# Patient Record
Sex: Female | Born: 1955 | Race: Black or African American | Hispanic: No | Marital: Married | Smoking: Never smoker
Health system: Southern US, Community
[De-identification: ages and names within clinical notes are randomized; demographics above are authoritative.]

## PROBLEM LIST (undated history)

## (undated) DIAGNOSIS — J9611 Chronic respiratory failure with hypoxia: Secondary | ICD-10-CM

## (undated) DIAGNOSIS — R7303 Prediabetes: Secondary | ICD-10-CM

## (undated) DIAGNOSIS — J849 Interstitial pulmonary disease, unspecified: Secondary | ICD-10-CM

## (undated) DIAGNOSIS — J479 Bronchiectasis, uncomplicated: Secondary | ICD-10-CM

## (undated) DIAGNOSIS — C50919 Malignant neoplasm of unspecified site of unspecified female breast: Secondary | ICD-10-CM

## (undated) DIAGNOSIS — I10 Essential (primary) hypertension: Secondary | ICD-10-CM

## (undated) DIAGNOSIS — J8410 Pulmonary fibrosis, unspecified: Secondary | ICD-10-CM

## (undated) DIAGNOSIS — R0600 Dyspnea, unspecified: Secondary | ICD-10-CM

## (undated) DIAGNOSIS — I509 Heart failure, unspecified: Secondary | ICD-10-CM

## (undated) HISTORY — PX: ABDOMINAL HYSTERECTOMY: SHX81

## (undated) HISTORY — DX: Malignant neoplasm of unspecified site of unspecified female breast: C50.919

---

## 2007-07-14 ENCOUNTER — Ambulatory Visit: Payer: Self-pay | Admitting: Family Medicine

## 2011-08-11 ENCOUNTER — Inpatient Hospital Stay: Payer: Self-pay | Admitting: Internal Medicine

## 2011-08-11 LAB — CK TOTAL AND CKMB (NOT AT ARMC)
CK, Total: 114 U/L (ref 21–215)
CK-MB: 2.5 ng/mL (ref 0.5–3.6)
CK-MB: 1.4 ng/mL (ref 0.5–3.6)
CK-MB: 1 ng/mL (ref 0.5–3.6)

## 2011-08-11 LAB — BASIC METABOLIC PANEL
BUN: 13 mg/dL (ref 7–18)
Calcium, Total: 8.6 mg/dL (ref 8.5–10.1)
Chloride: 107 mmol/L (ref 98–107)
Creatinine: 0.74 mg/dL (ref 0.60–1.30)
EGFR (African American): >60
Potassium: 3.4 mmol/L — ABNORMAL LOW (ref 3.5–5.1)
Sodium: 142 mmol/L (ref 136–145)

## 2011-08-11 LAB — LIPID PANEL
HDL Cholesterol: 31 mg/dL — ABNORMAL LOW (ref 40–60)
Ldl Cholesterol, Calc: 114 mg/dL — ABNORMAL HIGH (ref 0–100)
Triglycerides: 112 mg/dL (ref 0–200)
VLDL Cholesterol, Calc: 22 mg/dL (ref 5–40)

## 2011-08-11 LAB — TROPONIN I
Troponin-I: <0.02 ng/mL
Troponin-I: <0.02 ng/mL

## 2011-08-11 LAB — HEPATIC FUNCTION PANEL A (ARMC)
Albumin: 3.4 g/dL (ref 3.4–5.0)
Bilirubin, Direct: <0.1 mg/dL (ref 0.00–0.20)
SGOT(AST): 28 U/L (ref 15–37)
SGPT (ALT): 22 U/L

## 2011-08-11 LAB — CBC
HCT: 37.1 % (ref 35.0–47.0)
HGB: 12.2 g/dL (ref 12.0–16.0)
MCH: 26.5 pg (ref 26.0–34.0)
MCHC: 33 g/dL (ref 32.0–36.0)
Platelet: 278 10*3/uL (ref 150–440)
RBC: 4.61 10*6/uL (ref 3.80–5.20)

## 2011-08-11 LAB — TSH: Thyroid Stimulating Horm: 0.581 u[IU]/mL

## 2011-08-12 LAB — LIPID PANEL
Cholesterol: 165 mg/dL (ref 0–200)
Triglycerides: 134 mg/dL (ref 0–200)
VLDL Cholesterol, Calc: 27 mg/dL (ref 5–40)

## 2011-08-12 LAB — BASIC METABOLIC PANEL
Anion Gap: 11 (ref 7–16)
Calcium, Total: 8.6 mg/dL (ref 8.5–10.1)
Co2: 27 mmol/L (ref 21–32)
EGFR (African American): >60
Osmolality: 289 (ref 275–301)
Potassium: 3.9 mmol/L (ref 3.5–5.1)
Sodium: 145 mmol/L (ref 136–145)

## 2013-05-16 ENCOUNTER — Ambulatory Visit: Payer: Self-pay | Admitting: Family Medicine

## 2014-12-02 NOTE — Discharge Summary (Signed)
PATIENT NAME:  Erika Schmidt, Erika Schmidt MR#:  [id number] DATE OF BIRTH:  September 17, 1955  DATE OF ADMISSION:  08/11/2011 DATE OF DISCHARGE:  08/12/2011  ADMITTING DIAGNOSIS: Chest pain.   DISCHARGE DIAGNOSES:  1. Chest pain of unclear etiology at this time. Negative cardiac enzymes for injury, status post Myoview which was negative for inducible ischemia.  2. Malignant hypertension.  3. Congestive heart failure, left heart, acute, diastolic.  4. Lower extremity swelling.  5. Obesity.  6. Hyperglycemia with hemoglobin A1c 6.5, questionable diabetes.  7. Hyperglycemia with LDL 114.   DISCHARGE CONDITION: Fair.   DISCHARGE MEDICATIONS: The patient is to resume her outpatient medications which are: Visine 0.05% ophthalmic solution, two drops each affected eye as needed for red eye.   ADDITIONAL MEDICATIONS:  1. Aspirin 81 mg p.o. daily.  2. Maalox 15 mg every six hours as needed.  3. Nitroglycerin 0.4 mg sublingually every five minutes as needed.  4. Colace 100 milligrams p.o. twice daily.  5. Milk of magnesia 30 mL twice daily as needed.  6. Lisinopril 5 mg p.o. daily.  7. Lasix 20 mg p.o. daily.  8. K-Dur 10 milliequivalents p.o. daily.  9. Toprol-XL 50 mg p.o. daily.  HOME OXYGEN: None.   DIET: 2 grams salt, 1,800 ADA, low fat, low cholesterol.   PHYSICAL ACTIVITY LIMITATIONS: As tolerated.   FOLLOW-UP APPOINTMENT: Dr. Serafina Royals in two days after discharge as well as [facility] in two days after discharge.   CONSULTANTS: Dr. Nehemiah Massed as well as Dr. Saralyn Pilar.   RADIOLOGIC STUDIES: Chest x-ray PA and lateral 08/11/2011: Prominent pulmonary interstitium may be related to technique. Differential would include interstitial pulmonary edema and atypical infection. CT of chest with IV contrast to rule out pulmonary embolism on 08/11/2011 revealed no pulmonary embolus seen. Evaluation of the segmental pulmonary arteries is limited, nonspecific pulmonary ground-glass opacities.  Differential would include pulmonary edema, atypical infection and hypersensitive pneumonitis. There are trace bilateral pleural effusions Myoview stress test 08/12/2011 negative ETT. Normal left ventricular function, normal wall motion, normal sestamibi based scintigraphy without evidence of scar or ischemia. Ultrasound of bilateral lower extremities 08/10/2010: No evidence of deep vein thrombosis in bilateral lower extremities. Echocardiogram 08/14/2011: Left ventricular systolic function is normal. Ejection fraction 55%. Left atrium is mildly dilated. There is moderate to severe mild regurgitation. There is mild tricuspid regurgitation. Otherwise, no acute abnormalities.   HISTORY: The patient is a 59 year old African American female with no significant past medical history who presented to the hospital on 08/11/2011 with complaints of chest pains. Please refer to Dr. Serita Grit admission note on 08/11/2011. On arrival to the Emergency Room, the patient's vitals revealed that her temperature was 98.3, heart rate was 102, respiration rate 24, blood pressure 155/70, saturation was 95% on oxygen therapy.   PHYSICAL EXAMINATION: Unremarkable.   LABORATORY AND DIAGNOSTIC DATA: The patient's EKG in the Emergency Room showed normal sinus rhythm, possible left atrial enlargement, low voltage QRS, cannot rule out anterior infarct, age undetermined according to EKG criteria.  Lab data: BMP showed an elevated glucose to 109 and potassium level of 3.4. B-type natriuretic peptide was elevated at 417, otherwise unremarkable BMP. The patient's hemoglobin A1c was found to be elevated at 6.5. Liver enzymes were normal. The patient's cardiac enzymes, first set, as well as subsequent two more sets, were within normal limits. TSH was normal at 0.581. The patient's CBC was normal. D-dimer results were normal at 0.34. ABGs were done on 08/11/2011 and showed a pH of 7.48,  pCO2 37, pO2 118, saturation 98.9% on 28% FiO2 through nasal  cannula. Influenza test was negative. The patient's chest x-ray was concerning for possible congestive heart failure versus pneumonitis.  HOSPITAL COURSE: The patient was admitted to the hospital because of chest pain. She underwent an echocardiogram as well as Myoview stress test. Both of those studies were remarkable only for severe mitral regurgitation. The patient was consulted by Dr. Nehemiah Massed who followed the patient along. He felt that the patient should ambulate and follow for further significant symptoms requiring  further intervention. However, he recommended to discharge the patient to home if she is feeling well. She is to follow up with further risk factor treatment as outpatient. The patient was ambulated and she did not have any recurrent symptoms, so she will be discharged home to establish primary care physician at [facility] as well as follow-up with Dr. Nehemiah Massed in the next few days after discharge.   On the day of discharge, her temperature was 97.9, pulse 75, respirations 20, blood pressure 143/82, saturation was 97% on room air at rest. Etiology of chest pain remains unclear. However, it is possible that the patient did have chest pain because of elevated blood pressure or any other, including even gastroenterologic problems. It is recommended, however, to further evaluate the patient for recurrent chest pains if needed.   The patient was noted to have malignant hypertension. In fact, on arrival to the Emergency Room, the patient's blood pressure was elevated and it remained intermittently elevated while she was on the floor. Her blood pressure was as high as 160s. The patient was started on medication for blood pressure management and her blood pressure improved. It is recommended to follow the patient's blood pressure readings as outpatient and advance her medications as necessary. Because of concern about mild congestive heart failure due to lower extremity swelling as well as  nonspecific changes on her chest x-ray as well as CT of her chest, decision was made to place the patient also on low doses of Lasix apart from ACE inhibitor to improve the patient's cardiac afterload. She was recommended to follow up with her primary care physician, as well as cardiologist, for further recommendations and possibly even advancement of her medications if needed. As mentioned above, the patient's blood pressure readings were from 112 to 140s upon discharge.   The patient was felt to have congestive heart failure, left heart, acute diastolic, as well as lower extremity swelling related to congestive heart failure. As mentioned above, she is to continue Lasix as well as ACE inhibitors. She is to follow-up for further recommendations. The patient had nocturnal oximetry study while she was in the hospital because of concern of possible obstructive sleep apnea. However, she did not significantly desaturate during her stay in the hospital on overnight oximetry study. However, as it was unclear how much the patient actually slept, as the patient may benefit, it is recommended for the patient to undergo sleep study to better evaluate her for possible obstructive sleep apnea which was of concern upon discharge.  Because of the patient's obesity, multiple risk factors were evaluated. She was noted to be hyperglycemic. Her hemoglobin A1c was found to be also 6.5. She was counseled by dietitian about diabetic diet. She was also recommended to lose weight if possible to diminish her risks of diabetes.   The patient's lipid panel checked while she was in the hospital and LDL was found to be elevated at 114. The patient's triglycerides were  112 and HDL was 31. The patient was advised, as mentioned above, to follow strict diet and hopefully lose weight. This was a way to help herself with hyperlipidemia management. TSH was checked and was found to be within normal limits.  The patient is being discharged with  the above-mentioned medications and follow-up   TIME SPENT: 40 minutes.   ____________________________ Theodoro Grist, MD rv:ap D: 08/12/2011 19:07:24 ET T: 08/14/2011 11:39:08 ET JOB#: [id number]  cc: Theodoro Grist, MD, <Dictator> Corey Skains, MD [facility] [place] MD ELECTRONICALLY SIGNED 09/07/2011 7:15

## 2014-12-02 NOTE — H&P (Signed)
PATIENT NAME:  Erika Schmidt, Erika Schmidt MR#:  [id number] DATE OF BIRTH:  27-Aug-1955  DATE OF ADMISSION:  08/11/2011  PRIMARY CARE PHYSICIAN: [facility]  EMERGENCY ROOM PHYSICIAN: Dr. Payton Doughty   CHIEF COMPLAINT: Chest pain.   HISTORY OF PRESENT ILLNESS: Patient is a 59 year old African American female who presents with chief complaint of chest pain. Symptoms began at 1:00 in the morning while patient was sitting in bed resting. Patient had drank some champagne earlier last evening with her brother and she attributed her symptoms to drinking the champagne initially. Patient reports that she was short of breath associated with this and she was dizzy, he had palpitations. Chest pain she describes as constant, nagging pain. She denies any radiation of the pain. Patient had upper respiratory tract infection one week ago and she had been taking some NyQuil.    ALLERGIES: Norfloxacin.    CURRENT MEDICATIONS: None.   SOCIAL HISTORY: Patient denies tobacco abuse, alcohol abuse or drug abuse. 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   FAMILY HISTORY: Patient's father in his 33s died, had a stroke. Negative for hypertension. Family history positive for heart disease.   REVIEW OF SYSTEMS: CONSTITUTIONAL: Patient denies any fevers, chills, night sweats. HEENT: Patient denies any hearing loss, dysphagia, visual problems, sore throat. CARDIOVASCULAR: Patient denies any orthopnea, PND, syncope. RESPIRATORY: Patient denies any cough, wheezing, or hemoptysis. GASTROINTESTINAL: Patient denies any nausea, vomiting, abdominal pain, hematemesis, hematochezia, melena. GENITOURINARY: Patient denies any hematuria, dysuria, or frequency. NEUROLOGIC: Patient denies any headache, focal weakness or seizures. SKIN: Patient denies any lesions, rash. ENDOCRINE: Patient denies polyphagia, polydipsia. MUSCULOSKELETAL: Patient denies any arthritis, joint effusion, swelling. HEMATOLOGICAL: Patient denies any easy bleeding or  bruises.   PHYSICAL EXAMINATION:  VITAL SIGNS: Temperature 98.3, heart rate 102, respiratory rate 24, blood pressure 155/70, oxygen sats 95%.   HEENT: Atraumatic, normocephalic. Pupils are equal, round, and reactive to light and accommodation. Extraocular movements are intact. Sclera anicteric. Mucous membranes moist.   NECK: Supple. No organomegaly.   CARDIOVASCULAR: S1, S2, regular rate, rhythm. No gallops. No thrills. No murmurs.   RESPIRATORY: Lungs are clear to auscultation. No rales, no rhonchi, no wheezes, and bronchial breath sounds.  GASTROINTESTINAL: Abdomen soft, nontender, nondistended. Normal bowel sounds. No hepatosplenomegaly.   GENITOURINARY: There is no hematuria or masses noted.   SKIN: No lesions. No rash.   ENDOCRINE: No masses. No thyromegaly.  LYMPH: No lymphadenopathy or nodes palpable.   NEUROLOGIC: Cranial nerves II through XII grossly intact. Motor strength is 5/5 bilateral upper and lower extremities. Sensation within normal limits. No focal neurological deficits noted on examination.   MUSCULOSKELETAL: No arthritis, joint effusion, swelling.   HEMATOLOGIC: No ecchymosis, no bleeding, no petechiae noted.   LABORATORY, DIAGNOSTIC AND RADIOLOGICAL DATA: BNP 417. Glucose 109, BUN 13, creatinine 0.74, sodium 142, potassium 3.4, chloride 107. CO2 24, calcium 8.6, estimated GFR greater than 60, total CK 150, CK-MB 2.5. WBC count 10,200, hemoglobin 12.2, hematocrit 37.1, platelet count 278. Troponin less than 0.02, d-dimer 0.34, total bilirubin 0.5, direct bilirubin 0.1, alkaline phosphatase 73, ALT 22, AST 28, total protein 7.4, albumin 3.4. BNP 417.   ASSESSMENT AND PLAN:  1. Patient is a 59 year old female presents with chief complaint of chest pain. Will admit to telemetry, start aspirin, check serial cardiac enzymes, troponin, echo. Cardiology consultation.  2. Hypertension. Start Lopressor.  3. Elevated BNP, mild to moderate elevation. Unclear significance.  On chest examination lungs without any crackles. Likely this represents new-onset congestive heart failure. 4. Hypokalemia.  Replace potassium. Recheck in the morning.  5. Hypoxia. Will check ABG. Check CT of the chest for evaluation of pulmonary embolus.   ____________________________ Tyrone Schimke, MD jsp:cms D: 08/11/2011 06:32:14 ET T: 08/12/2011 05:51:58 ET JOB#: [id number]  cc: Tyrone Schimke, MD, <Dictator> [place] Tyrone Schimke MD ELECTRONICALLY SIGNED 08/12/2011 21:45

## 2014-12-02 NOTE — Consult Note (Signed)
Present Illness 59 year old female with known hypertension, borderline hyperlipidemia with acute onset of shortness of breath, weakness, chest discomfort over the last several days.  The patient has had some hypoxia and wheezing on admission.  She does have an EKG showing normal sinus rhythm with nonspecific ST changes.  Troponin and CK-MB are within normal limits.  She does also have resolution of her chest pain without current evidence of myocardial infarction.  She has been ambulating relatively well at this stage, without any further chest discomfort  Family history No family members with early onset cardiovascular disease  Social history She currently denies alcohol or tobacco use   Physical Exam:   GEN WD    HEENT pink conjunctivae    NECK No masses    RESP crackles    CARD Regular rate and rhythm    ABD denies tenderness  no hernia  soft    LYMPH negative neck    EXTR positive edema    NEURO cranial nerves intact    PSYCH alert   Review of Systems:   Subjective/Chief Complaint I'm short of breath and had chest pain    Respiratory: Short of breath    Cardiovascular: Tightness    Review of Systems: All other systems were reviewed and found to be negative    Medications/Allergies Reviewed Medications/Allergies reviewed     denies med hx:    hysterectomy:        Admit Diagnosis:   CHEST PAIN [place]: 11-Aug-2011, Active, CHEST PAIN [place]      Admit Reason:   Unspecified chest pain: (786.50) 11-Aug-2011, Active, ICD9, Unspecified chest pain, Auto-generated by [facility] Based on Admission Order   Intermediate coronary syndrome: (411.1) 11-Aug-2011, Active, ICD9, Intermediate coronary syndrome, Auto-generated by [facility] Based on Admission Order   Acute myocardial infarction, unspecified site, episode of care unspecified: (410.90) 11-Aug-2011, Active, ICD9, Acute myocardial infarction, unspecified site, episode of care unspecified, Auto-generated by [facility] Based on Admission  Order  Routine Chem:  01-Jan-13 04:24    Glucose, Serum 109   BUN 13   Creatinine (comp) 0.74   Sodium, Serum 142   Potassium, Serum 3.4   Chloride, Serum 107   CO2, Serum 24   Calcium (Total), Serum 8.6   Anion Gap 11   Osmolality (calc) 284   eGFR (African American) >60   eGFR (Non-African American) >60  Cardiac:  01-Jan-13 04:24    CK, Total 150   CPK-MB, Serum 2.5  Routine Hem:  01-Jan-13 04:24    WBC (CBC) 10.2   RBC (CBC) 4.61   Hemoglobin (CBC) 12.2   Hematocrit (CBC) 37.1   Platelet Count (CBC) 278   MCV 80   MCH 26.5   MCHC 33.0   RDW 14.2  Cardiac:  01-Jan-13 04:24    Troponin I < 0.02  Routine Coag:  01-Jan-13 04:24    D-Dimer, Quantitative 0.34  Hepatic:  01-Jan-13 04:24    Bilirubin, Total 0.5   Bilirubin, Direct < 0.1   Alkaline Phosphatase 73   SGPT (ALT) 22   SGOT (AST) 28   Total Protein, Serum 7.4   Albumin, Serum 3.4  Routine Chem:  01-Jan-13 04:24    B-Type Natriuretic Peptide ([facility]) 417   Hemoglobin A1c ([facility]) 6.5  Routine Micro:  01-Jan-13 06:51    Specimen Source SWAB  Lab:  01-Jan-13 11:25    pH (ABG) 7.48   PCO2 37   PO2 118   FiO2 28   Base Excess 4.0  HCO3 27.6   O2 Saturation 98.9   O2 Device CANNULA   Specimen Type (ABG) ARTERIAL   Patient Temp (ABG) 37.0  Cardiac:  01-Jan-13 16:01    CK, Total 114   CPK-MB, Serum 1.4   Troponin I < 0.02  Routine Chem:  01-Jan-13 16:01    Magnesium, Serum 2.2   Magnesium, Serum 2.0   Cholesterol, Serum 167   Triglycerides, Serum 112   HDL (INHOUSE) 31   VLDL Cholesterol Calculated 22   LDL Cholesterol Calculated 114  Thyroid:  01-Jan-13 16:01    Thyroid Stimulating Hormone 0.581   EKG:   EKG Interp. by me    Interpretation normal sinus rhythm with nonspecific ST and T-wave changes    Norfloxacin: Unknown  Vital Signs/Nurse's Notes: **Vital Signs.:   01-Jan-13 14:52   Vital Signs Type Admission   Temperature Temperature (F) 98.7   Celsius 37   Temperature  Source oral   Pulse Pulse 75   Pulse source per Dinamap   Respirations Respirations 18   Systolic BP Systolic BP 233   Diastolic BP (mmHg) Diastolic BP (mmHg) 79   Mean BP 106   BP Source Dinamap   Pulse Ox % Pulse Ox % 97   Pulse Ox Activity Level  At rest   Oxygen Delivery 2L     Impression 59 year old female with risk factors for cardiovascular disease and including hypertension, hyperlipidemia with new onset of shortness of breath, chest discomfort, consistent with myocardial and ischemia and/or angina with mild congestive heart failure and no current evidence of myocardial infarction    Plan 1.  Serial ECG and enzymes to assess for possible myocardial infarction. 2.  Possible Lexus scan perfusion Myoview to assess for myocardial ischemia. 3.  Echocardiogram for LV systolic dysfunction, valvular heart disease contributing to above. 4.  Continue medication management of hypertension and further risk of angina with beta blocker and aspirin. 5.  Consider Lasix for shortness of breath and heart failure type symptoms 6.  Further treatment options after ambulation and treatment above   Electronic Signatures: Corey Skains (MD)  (Signed 01-Jan-13 20:01)  Authored: General Aspect/Present Illness, History and Physical Exam, Review of System, Past Medical History, Health Issues, Labs, EKG , Allergies, Vital Signs/Nurse's Notes, Impression/Plan   Last Updated: 01-Jan-13 20:01 by Corey Skains (MD)

## 2014-12-04 ENCOUNTER — Other Ambulatory Visit: Payer: Self-pay | Admitting: Family Medicine

## 2014-12-04 DIAGNOSIS — Z1231 Encounter for screening mammogram for malignant neoplasm of breast: Secondary | ICD-10-CM

## 2015-02-05 DIAGNOSIS — Z8679 Personal history of other diseases of the circulatory system: Secondary | ICD-10-CM | POA: Insufficient documentation

## 2015-02-12 ENCOUNTER — Ambulatory Visit
Admission: RE | Admit: 2015-02-12 | Discharge: 2015-02-12 | Disposition: A | Discharge status: Home / Self Care | Payer: 59 | Source: Ambulatory Visit | Attending: Family Medicine | Admitting: Family Medicine

## 2015-02-12 DIAGNOSIS — Z1231 Encounter for screening mammogram for malignant neoplasm of breast: Secondary | ICD-10-CM

## 2015-03-05 DIAGNOSIS — E782 Mixed hyperlipidemia: Secondary | ICD-10-CM | POA: Insufficient documentation

## 2015-03-05 DIAGNOSIS — I340 Nonrheumatic mitral (valve) insufficiency: Secondary | ICD-10-CM | POA: Insufficient documentation

## 2015-03-05 DIAGNOSIS — I10 Essential (primary) hypertension: Secondary | ICD-10-CM | POA: Insufficient documentation

## 2015-03-22 ENCOUNTER — Encounter: Payer: Self-pay | Admitting: *Deleted

## 2015-03-25 ENCOUNTER — Ambulatory Visit: Payer: Commercial Managed Care - HMO | Admitting: Anesthesiology

## 2015-03-25 ENCOUNTER — Encounter
Admission: RE | Disposition: A | Discharge status: Home / Self Care | Payer: Self-pay | Source: Ambulatory Visit | Attending: Gastroenterology

## 2015-03-25 ENCOUNTER — Ambulatory Visit
Admission: RE | Admit: 2015-03-25 | Discharge: 2015-03-25 | Disposition: A | Discharge status: Home / Self Care | Payer: Commercial Managed Care - HMO | Source: Ambulatory Visit | Attending: Gastroenterology | Admitting: Gastroenterology

## 2015-03-25 ENCOUNTER — Encounter: Payer: Self-pay | Admitting: *Deleted

## 2015-03-25 DIAGNOSIS — I10 Essential (primary) hypertension: Secondary | ICD-10-CM | POA: Insufficient documentation

## 2015-03-25 DIAGNOSIS — E669 Obesity, unspecified: Secondary | ICD-10-CM | POA: Diagnosis not present

## 2015-03-25 DIAGNOSIS — K621 Rectal polyp: Secondary | ICD-10-CM | POA: Diagnosis not present

## 2015-03-25 DIAGNOSIS — Z79899 Other long term (current) drug therapy: Secondary | ICD-10-CM | POA: Diagnosis not present

## 2015-03-25 DIAGNOSIS — Z7982 Long term (current) use of aspirin: Secondary | ICD-10-CM | POA: Diagnosis not present

## 2015-03-25 DIAGNOSIS — Z1211 Encounter for screening for malignant neoplasm of colon: Secondary | ICD-10-CM | POA: Insufficient documentation

## 2015-03-25 DIAGNOSIS — Z6841 Body Mass Index (BMI) 40.0 and over, adult: Secondary | ICD-10-CM | POA: Diagnosis not present

## 2015-03-25 DIAGNOSIS — D125 Benign neoplasm of sigmoid colon: Secondary | ICD-10-CM | POA: Insufficient documentation

## 2015-03-25 HISTORY — PX: COLONOSCOPY WITH PROPOFOL: SHX5780

## 2015-03-25 HISTORY — DX: Heart failure, unspecified: I50.9

## 2015-03-25 HISTORY — DX: Essential (primary) hypertension: I10

## 2015-03-25 SURGERY — COLONOSCOPY WITH PROPOFOL
Anesthesia: General

## 2015-03-25 MED ORDER — SODIUM CHLORIDE 0.9 % IV SOLN: INTRAVENOUS | Status: DC

## 2015-03-25 MED ORDER — EPHEDRINE SULFATE 50 MG/ML IJ SOLN
INTRAMUSCULAR | Status: DC | PRN
  Administered 2015-03-25: 5 mg via INTRAVENOUS

## 2015-03-25 MED ORDER — MIDAZOLAM HCL 5 MG/5ML IJ SOLN
INTRAMUSCULAR | Status: DC | PRN
  Administered 2015-03-25: 1 mg via INTRAVENOUS

## 2015-03-25 MED ORDER — PROPOFOL INFUSION 10 MG/ML OPTIME
INTRAVENOUS | Status: DC | PRN
  Administered 2015-03-25: 120 ug/kg/min via INTRAVENOUS

## 2015-03-25 MED ORDER — FENTANYL CITRATE (PF) 100 MCG/2ML IJ SOLN
INTRAMUSCULAR | Status: DC | PRN
  Administered 2015-03-25: 50 ug via INTRAVENOUS

## 2015-03-25 MED ORDER — PROPOFOL 10 MG/ML IV BOLUS
INTRAVENOUS | Status: DC | PRN
  Administered 2015-03-25: 50 mg via INTRAVENOUS

## 2015-03-25 MED ORDER — LIDOCAINE HCL (CARDIAC) 20 MG/ML IV SOLN
INTRAVENOUS | Status: DC | PRN
  Administered 2015-03-25: 30 mg via INTRAVENOUS

## 2015-03-25 MED ORDER — SODIUM CHLORIDE 0.9 % IV SOLN
INTRAVENOUS | Status: DC
  Administered 2015-03-25: 08:00:00 via INTRAVENOUS
  Administered 2015-03-25: 1000 mL via INTRAVENOUS
  Administered 2015-03-25: 09:00:00 via INTRAVENOUS

## 2015-03-25 NOTE — H&P (Signed)
Outpatient short stay form Pre-procedure 03/25/2015 8:16 AM Lollie Sails MD  Primary Physician: Dr. Sharen Hones  Reason for visit:  Screening colonoscopy  History of present illness:  Patient is a 59 year old female presenting today for colonoscopy. Since her first colonoscopy. Tolerated her prep well. She does take a 81 mg aspirin. He takes no other aspirin products or anticoagulates.    Current facility-administered medications:  .  0.9 %  sodium chloride infusion, , Intravenous, Continuous, Lollie Sails, MD, Last Rate: 20 mL/hr at 03/25/15 0716 .  0.9 %  sodium chloride infusion, , Intravenous, Continuous, Lollie Sails, MD  Facility-Administered Medications Ordered in Other Encounters:  .  fentaNYL (SUBLIMAZE) injection, , Intravenous, Anesthesia Intra-op, Courtney Paris, CRNA, 50 mcg at 03/25/15 1448 .  midazolam (VERSED) 5 MG/5ML injection, , Intravenous, Anesthesia Intra-op, Courtney Paris, CRNA, 1 mg at 03/25/15 0813  Prescriptions prior to admission  Medication Sig Dispense Refill Last Dose  . aspirin 81 MG tablet Take 81 mg by mouth daily.     . furosemide (LASIX) 20 MG tablet Take 20 mg by mouth.     Marland Kitchen lisinopril (PRINIVIL,ZESTRIL) 5 MG tablet Take 5 mg by mouth daily.   03/25/2015 at [place]  . metoprolol succinate (TOPROL-XL) 25 MG 24 hr tablet Take 25 mg by mouth daily.   03/25/2015 at [place]  . potassium chloride (K-DUR,KLOR-CON) 10 MEQ tablet Take 20 mEq by mouth daily.   03/25/2015 at [place]  . terbinafine (LAMISIL) 250 MG tablet Take 250 mg by mouth daily.   Not Taking at Unknown time     Allergies  Allergen Reactions  . Floxin [Ofloxacin]      Past Medical History  Diagnosis Date  . Hypertension   . CHF (congestive heart failure)     Review of systems:      Physical Exam    Heart and lungs: Regular rate and rhythm without rub or gallop, lungs are bilaterally clear    HEENT: Normocephalic atraumatic eyes are anicteric    Other:     Pertinant  exam for procedure: Soft nontender nondistended bowel sounds positive normoactive    Planned proceedures: Colonoscopy I have discussed the risks benefits and complications of procedures to include not limited to bleeding, infection, perforation and the risk of sedation and the patient wishes to proceed. and indicated procedures    Lollie Sails, MD Gastroenterology 03/25/2015  8:16 AM

## 2015-03-25 NOTE — Anesthesia Preprocedure Evaluation (Signed)
Anesthesia Evaluation  Patient identified by MRN, date of birth, ID band Patient awake    Reviewed: Allergy & Precautions, NPO status , Patient's Chart, lab work & pertinent test results  History of Anesthesia Complications Negative for: history of anesthetic complications  Airway Mallampati: II       Dental no notable dental hx.    Pulmonary neg pulmonary ROS,    Pulmonary exam normal       Cardiovascular hypertension, Pt. on medications Normal cardiovascular exam    Neuro/Psych negative neurological ROS  negative psych ROS   GI/Hepatic negative GI ROS,   Endo/Other  negative endocrine ROS  Renal/GU negative Renal ROS     Musculoskeletal negative musculoskeletal ROS (+)   Abdominal (+) + obese,   Peds negative pediatric ROS (+)  Hematology negative hematology ROS (+)   Anesthesia Other Findings   Reproductive/Obstetrics negative OB ROS                             Anesthesia Physical Anesthesia Plan  ASA: III  Anesthesia Plan: General   Post-op Pain Management:    Induction: Intravenous  Airway Management Planned: Nasal Cannula  Additional Equipment:   Intra-op Plan:   Post-operative Plan:   Informed Consent: I have reviewed the patients History and Physical, chart, labs and discussed the procedure including the risks, benefits and alternatives for the proposed anesthesia with the patient or authorized representative who has indicated his/her understanding and acceptance.     Plan Discussed with: CRNA  Anesthesia Plan Comments:         Anesthesia Quick Evaluation

## 2015-03-25 NOTE — Anesthesia Postprocedure Evaluation (Signed)
  Anesthesia Post-op Note  Patient: Erika Schmidt  Procedure(s) Performed: Procedure(s): COLONOSCOPY WITH PROPOFOL (N/A)  Anesthesia type:General  Patient location: PACU  Post pain: Pain level controlled  Post assessment: Post-op Vital signs reviewed, Patient's Cardiovascular Status Stable, Respiratory Function Stable, Patent Airway and No signs of Nausea or vomiting  Post vital signs: Reviewed and stable  Last Vitals:  Filed Vitals:   03/25/15 0940  BP: 109/56  Pulse: 62  Temp:   Resp: 19    Level of consciousness: awake, alert  and patient cooperative  Complications: No apparent anesthesia complications

## 2015-03-25 NOTE — Op Note (Signed)
[facility] Gastroenterology Patient Name: Erika Schmidt Procedure Date: 03/25/2015 8:09 AM MRN: [id number] Account #: [id number] Date of Birth: 1956/05/17 Admit Type: Outpatient Age: 59 Room: [facility] ENDO ROOM 3 Gender: Female Note Status: Finalized Procedure:         Colonoscopy Indications:       Screening for colorectal malignant neoplasm Providers:         Lollie Sails, MD Referring MD:      No Local Md, MD (Referring MD) Medicines:         Monitored Anesthesia Care Complications:     No immediate complications. Procedure:         Pre-Anesthesia Assessment:                    - ASA Grade Assessment: III - A patient with severe                     systemic disease.                    After obtaining informed consent, the colonoscope was                     passed under direct vision. Throughout the procedure, the                     patient's blood pressure, pulse, and oxygen saturations                     were monitored continuously. The Colonoscope was                     introduced through the anus and advanced to the the cecum,                     identified by appendiceal orifice and ileocecal valve. The                     colonoscopy was unusually difficult due to significant                     looping and a tortuous colon. Successful completion of the                     procedure was aided by changing the patient to a supine                     position, changing the patient to a prone position, using                     manual pressure and withdrawing and reinserting the scope.                     The patient tolerated the procedure well. The quality of                     the bowel preparation was good. Findings:      A 3 mm polyp was found in the rectum. The polyp was sessile. The polyp       was removed with a cold snare. Resection and retrieval were complete.      A 2 mm polyp was found in the sigmoid colon. The polyp was sessile. The    polyp was removed with a cold biopsy forceps. Resection  and retrieval       were complete.      Four sessile polyps were found in the recto-sigmoid colon. The polyps       were less than 1 mm in size. These polyps were removed with a cold       biopsy forceps. Resection and retrieval were complete.      A less than 1 mm polyp was found in the rectum. The polyp was sessile.       The polyp was removed with a cold biopsy forceps. Resection and       retrieval were complete.      The digital rectal exam was normal. Impression:        - One 3 mm polyp in the rectum. Resected and retrieved.                    - One 2 mm polyp in the sigmoid colon. Resected and                     retrieved.                    - Four less than 1 mm polyps at the recto-sigmoid colon.                     Resected and retrieved.                    - One less than 1 mm polyp in the rectum. Resected and                     retrieved. Recommendation:    - Await pathology results.                    - Telephone GI clinic for pathology results in 1 week. Procedure Code(s): --- Professional ---                    [phone], Colonoscopy, flexible; with removal of tumor(s),                     polyp(s), or other lesion(s) by snare technique                    45380, 59, Colonoscopy, flexible; with biopsy, single or                     multiple Diagnosis Code(s): --- Professional ---                    V76.51, Special screening for malignant neoplasms of colon                    211.3, Benign neoplasm of colon                    569.0, Anal and rectal polyp                    211.4, Benign neoplasm of rectum and anal canal CPT copyright 2014 American Medical Association. All rights reserved. The codes documented in this report are preliminary and upon coder review may  be revised to meet current compliance requirements. Lollie Sails, MD 03/25/2015 9:04:35 AM This report has been signed electronically. Number of  Addenda: 0 Note Initiated On: 03/25/2015 8:09 AM Scope Withdrawal Time: 0 hours  8 minutes 37 seconds  Total Procedure Duration: 0 hours 38 minutes 49 seconds       [facility]

## 2015-03-25 NOTE — Transfer of Care (Signed)
Immediate Anesthesia Transfer of Care Note  Patient: Erika Schmidt  Procedure(s) Performed: Procedure(s): COLONOSCOPY WITH PROPOFOL (N/A)  Patient Location: PACU and Short Stay  Anesthesia Type:General  Level of Consciousness: awake and patient cooperative  Airway & Oxygen Therapy: Patient Spontanous Breathing and Patient connected to face mask oxygen  Post-op Assessment: Report given to RN  Post vital signs: Reviewed  Last Vitals:  Filed Vitals:   03/25/15 0911  BP: 104/56  Pulse: 73  Temp:   Resp: 20    Complications: No apparent anesthesia complications

## 2015-03-26 ENCOUNTER — Encounter: Payer: Self-pay | Admitting: Gastroenterology

## 2015-03-26 LAB — SURGICAL PATHOLOGY

## 2015-07-16 ENCOUNTER — Ambulatory Visit (INDEPENDENT_AMBULATORY_CARE_PROVIDER_SITE_OTHER): Payer: 59 | Admitting: Podiatry

## 2015-07-16 ENCOUNTER — Encounter: Payer: Self-pay | Admitting: Podiatry

## 2015-07-16 VITALS — BP 135/73 | HR 83 | Resp 18

## 2015-07-16 DIAGNOSIS — B351 Tinea unguium: Secondary | ICD-10-CM

## 2015-07-16 NOTE — Patient Instructions (Signed)

## 2015-07-16 NOTE — Progress Notes (Signed)
   Subjective:    Patient ID: Erika Schmidt, female    DOB: 1956-05-27, 59 y.o.   MRN: [id number]  HPI  Patient presents the office today with concerns of possible fungus to both verbally toenails. She previously was on Lamisil which she did 3 months of treatment starting in March. She states that this did not help much. She denies any pain or swelling to the toe denies any redness or drainage. No clinical signs of infection otherwise. She states the nails are difficult to trim due to the thickness. No other complaints.    Review of Systems  All other systems reviewed and are negative.      Objective:   Physical Exam General: AAO x3, NAD  Dermatological: Skin is warm, dry and supple bilateral. Bilateral hallux nails are significantly hypertrophic, dystrophic, brittle, discolored, elongated. There is no tenderness palpation. No swelling erythema or drainage. There are no open sores, no preulcerative lesions, no rash or signs of infection present.  Vascular: Dorsalis Pedis artery and Posterior Tibial artery pedal pulses are 2/4 bilateral with immedate capillary fill time. Pedal hair growth present. No varicosities and no lower extremity edema present bilateral. There is no pain with calf compression, swelling, warmth, erythema.   Neruologic: Grossly intact via light touch bilateral. Vibratory intact via tuning fork bilateral. Protective threshold with Semmes Wienstein monofilament intact to all pedal sites bilateral.   Musculoskeletal: No gross boney pedal deformities bilateral. No pain, crepitus, or limitation noted with foot and ankle range of motion bilateral. Muscular strength 5/5 in all groups tested bilateral.  Gait: Unassisted, Nonantalgic.         Assessment & Plan:  59 year old female bilateral hallux onychodystrophy, possible onychomycosis -Treatment options discussed including all alternatives, risks, and complications -Given that she screams treatment without any  resolution I recommended biopsy the nail. The nails were debrided and sent for culture. -Follow-up after nail culture or sooner if any problems arise. In the meantime, encouraged to call the office with any questions, concerns, change in symptoms.   Celesta Gentile, DPM

## 2015-08-13 ENCOUNTER — Encounter: Payer: Self-pay | Admitting: Podiatry

## 2015-08-13 ENCOUNTER — Ambulatory Visit (INDEPENDENT_AMBULATORY_CARE_PROVIDER_SITE_OTHER): Payer: 59 | Admitting: Podiatry

## 2015-08-13 VITALS — BP 137/82 | HR 89 | Resp 18

## 2015-08-13 DIAGNOSIS — B351 Tinea unguium: Secondary | ICD-10-CM | POA: Diagnosis not present

## 2015-08-13 NOTE — Progress Notes (Signed)
Patient ID: Erika Schmidt, female   DOB: [birth year], 60 y.o.   MRN: [id number]  Subjective: 60 year old female presents the office today for follow-up evaluation of possible onychomycosis and discussed the nail biopsy culture results. She previously was on Lamisil without much relief. She denies any pain to the toes and denies any swelling redness or drainage from the toenails. No other complaints.  Objective: AAO 3, NAD DP/PT pulses palpable 2/4, CRT less than 3 seconds Protective sensation intact with Simms once the monofilament Bilateral hallux nails are hypertrophic, dystrophic, brittle, discolored, elongated. No swelling erythema or drainage. No tenderness to palpation of the nails. No open lesions or pre-ulcerative lesions. No pain with calf compression, swelling, warmth, erythema.   Assessment: 60 year old female with bilateral hallux onychomycosis  Plan: Nail biopsy results were discussed the patient withstood revealed onychomycosis. Given that she is previously on Lamisil discussed itraconazole. Given history of CHF will hold off on itraconazle orally. Discussed topical treatment with topical Lamisil and itraconazole. Discussed there is a risk of side effects but minimal given topical application. Should there be any signs or symptoms of side effects to stop immediately and call the office. Follow-up in the next several months if symptoms continue.   Celesta Gentile, DPM

## 2017-02-02 ENCOUNTER — Other Ambulatory Visit: Payer: Self-pay | Admitting: Family Medicine

## 2017-02-02 DIAGNOSIS — Z1231 Encounter for screening mammogram for malignant neoplasm of breast: Secondary | ICD-10-CM

## 2017-05-25 ENCOUNTER — Ambulatory Visit
Admission: RE | Admit: 2017-05-25 | Discharge: 2017-05-25 | Disposition: A | Discharge status: Home / Self Care | Payer: 59 | Source: Ambulatory Visit | Attending: Family Medicine | Admitting: Family Medicine

## 2017-05-25 DIAGNOSIS — Z1231 Encounter for screening mammogram for malignant neoplasm of breast: Secondary | ICD-10-CM | POA: Insufficient documentation

## 2019-06-07 ENCOUNTER — Other Ambulatory Visit: Payer: Self-pay | Admitting: Family Medicine

## 2019-06-07 DIAGNOSIS — Z1231 Encounter for screening mammogram for malignant neoplasm of breast: Secondary | ICD-10-CM

## 2019-06-21 ENCOUNTER — Emergency Department: Payer: 59

## 2019-06-21 ENCOUNTER — Other Ambulatory Visit: Payer: Self-pay

## 2019-06-21 ENCOUNTER — Emergency Department
Admission: EM | Admit: 2019-06-21 | Discharge: 2019-06-22 | Disposition: A | Discharge status: Other Acute Inpatient Hospital | Payer: 59 | Attending: Emergency Medicine | Admitting: Emergency Medicine

## 2019-06-21 DIAGNOSIS — I110 Hypertensive heart disease with heart failure: Secondary | ICD-10-CM | POA: Insufficient documentation

## 2019-06-21 DIAGNOSIS — J9601 Acute respiratory failure with hypoxia: Secondary | ICD-10-CM

## 2019-06-21 DIAGNOSIS — U071 COVID-19: Secondary | ICD-10-CM

## 2019-06-21 DIAGNOSIS — I509 Heart failure, unspecified: Secondary | ICD-10-CM | POA: Insufficient documentation

## 2019-06-21 DIAGNOSIS — J9691 Respiratory failure, unspecified with hypoxia: Secondary | ICD-10-CM | POA: Diagnosis not present

## 2019-06-21 DIAGNOSIS — Z7982 Long term (current) use of aspirin: Secondary | ICD-10-CM | POA: Insufficient documentation

## 2019-06-21 DIAGNOSIS — Z79899 Other long term (current) drug therapy: Secondary | ICD-10-CM | POA: Diagnosis not present

## 2019-06-21 DIAGNOSIS — R0602 Shortness of breath: Secondary | ICD-10-CM | POA: Diagnosis present

## 2019-06-21 LAB — COMPREHENSIVE METABOLIC PANEL
ALT: 16 U/L (ref 0–44)
AST: 29 U/L (ref 15–41)
Albumin: 3 g/dL — ABNORMAL LOW (ref 3.5–5.0)
Alkaline Phosphatase: 63 U/L (ref 38–126)
Anion gap: 10 (ref 5–15)
BUN: 22 mg/dL (ref 8–23)
CO2: 22 mmol/L (ref 22–32)
Calcium: 8.3 mg/dL — ABNORMAL LOW (ref 8.9–10.3)
Chloride: 99 mmol/L (ref 98–111)
Creatinine, Ser: 0.89 mg/dL (ref 0.44–1.00)
GFR calc Af Amer: >60 mL/min (ref >60)
GFR calc non Af Amer: >60 mL/min (ref >60)
Glucose, Bld: 115 mg/dL — ABNORMAL HIGH (ref 70–99)
Potassium: 3.8 mmol/L (ref 3.5–5.1)
Sodium: 131 mmol/L — ABNORMAL LOW (ref 135–145)
Total Bilirubin: 0.7 mg/dL (ref 0.3–1.2)
Total Protein: 6.2 g/dL — ABNORMAL LOW (ref 6.5–8.1)

## 2019-06-21 LAB — TRIGLYCERIDES: Triglycerides: 277 mg/dL — ABNORMAL HIGH (ref <150)

## 2019-06-21 LAB — CBC WITH DIFFERENTIAL/PLATELET
Abs Immature Granulocytes: 0.06 10*3/uL (ref 0.00–0.07)
Basophils Absolute: 0 10*3/uL (ref 0.0–0.1)
Basophils Relative: 0 %
Eosinophils Absolute: 0.1 10*3/uL (ref 0.0–0.5)
Eosinophils Relative: 1 %
HCT: 40.2 % (ref 36.0–46.0)
Hemoglobin: 13 g/dL (ref 12.0–15.0)
Immature Granulocytes: 1 %
Lymphocytes Relative: 17 %
Lymphs Abs: 1.4 10*3/uL (ref 0.7–4.0)
MCH: 25.4 pg — ABNORMAL LOW (ref 26.0–34.0)
MCHC: 32.3 g/dL (ref 30.0–36.0)
MCV: 78.7 fL — ABNORMAL LOW (ref 80.0–100.0)
Monocytes Absolute: 0.8 10*3/uL (ref 0.1–1.0)
Monocytes Relative: 10 %
Neutro Abs: 6.2 10*3/uL (ref 1.7–7.7)
Neutrophils Relative %: 71 %
Platelets: 247 10*3/uL (ref 150–400)
RBC: 5.11 MIL/uL (ref 3.87–5.11)
RDW: 14.5 % (ref 11.5–15.5)
WBC: 8.6 10*3/uL (ref 4.0–10.5)
nRBC: 0 % (ref 0.0–0.2)

## 2019-06-21 LAB — TROPONIN I (HIGH SENSITIVITY)
Troponin I (High Sensitivity): 53 ng/L — ABNORMAL HIGH (ref <18)
Troponin I (High Sensitivity): 48 ng/L — ABNORMAL HIGH (ref <18)

## 2019-06-21 LAB — LACTATE DEHYDROGENASE: LDH: 249 U/L — ABNORMAL HIGH (ref 98–192)

## 2019-06-21 LAB — LACTIC ACID, PLASMA: Lactic Acid, Venous: 1.5 mmol/L (ref 0.5–1.9)

## 2019-06-21 LAB — C-REACTIVE PROTEIN: CRP: 1.8 mg/dL — ABNORMAL HIGH (ref <1.0)

## 2019-06-21 LAB — BRAIN NATRIURETIC PEPTIDE: B Natriuretic Peptide: 108 pg/mL — ABNORMAL HIGH (ref 0.0–100.0)

## 2019-06-21 LAB — PROCALCITONIN: Procalcitonin: <0.1 ng/mL

## 2019-06-21 LAB — FERRITIN: Ferritin: 182 ng/mL (ref 11–307)

## 2019-06-21 LAB — FIBRINOGEN: Fibrinogen: 388 mg/dL (ref 210–475)

## 2019-06-21 LAB — FIBRIN DERIVATIVES D-DIMER (ARMC ONLY): Fibrin derivatives D-dimer (ARMC): 1284.41 ng/mL (FEU) — ABNORMAL HIGH (ref 0.00–499.00)

## 2019-06-21 MED ORDER — METHYLPREDNISOLONE SODIUM SUCC 125 MG IJ SOLR
60.0000 mg | INTRAMUSCULAR | Status: DC
  Administered 2019-06-21: 60 mg via INTRAVENOUS
  Filled 2019-06-21: qty 2

## 2019-06-21 MED ORDER — SODIUM CHLORIDE 0.9 % IV BOLUS
500.0000 mL | Freq: Once | INTRAVENOUS | Status: AC
  Administered 2019-06-21: 19:00:00 500 mL via INTRAVENOUS

## 2019-06-21 MED ORDER — SODIUM CHLORIDE 0.9 % IV SOLN
200.0000 mg | Freq: Once | INTRAVENOUS | Status: AC
  Administered 2019-06-21: 200 mg via INTRAVENOUS
  Filled 2019-06-21: qty 40

## 2019-06-21 NOTE — ED Triage Notes (Signed)
Per EMS pt was seen for weakness and cramping of the legs. Yesterday COVID test came back positive. Pt c/o increasing SOB, worsening weakness and pain in legs.

## 2019-06-21 NOTE — ED Notes (Signed)
This RN called [facility] where pt stated she was tested for COVID. Was informed to fax over medical release form. Obtaining signature from pt to be able to obtain COVID results. Will fax to [phone]

## 2019-06-21 NOTE — Consult Note (Signed)
Remdesivir - Pharmacy Brief Note   O:  ALT: 16 CXR: Patchy bilateral pulmonary infiltrates SpO2: 95% on 4L Erika Schmidt   A/P:  Patient diagnosed with COVID 1 week ago at outpatient urgent care. Test is still pending here. Pending transfer to [place].   Remdesivir 200 mg IVPB once followed by 100 mg IVPB daily x 4 days.   [place] Resident 06/21/2019 5:40 PM

## 2019-06-21 NOTE — ED Provider Notes (Signed)
[facility] Emergency Department Provider Note  ____________________________________________   First MD Initiated Contact with Patient 06/21/19 1522     (approximate)  I have reviewed the triage vital signs and the nursing notes.   HISTORY  Chief Complaint Shortness of Breath (COVID +)    HPI Erika Schmidt is a 63 y.o. female with CHF who comes in for weakness and cramping in the legs.  Patient was coronavirus positive.  Patient states she is been feeling unwell since 1 week ago.  She was tested a few days ago but her results came back yesterday that she was coronavirus positive.  She comes in for worsening shortness of breath is severe, constant, worse with exertion, better at rest.  She does take Lasix for her CHF but denies any weight gain.  She denies any COPD or asthma history.  She is not had any fevers.  No abdominal pain or chest pain.  She just says she feels generalized weakness.  No prior history of PE.   Patient was 63% on room air.  Patient was placed on 4 L and satting 92%   Past Medical History:  Diagnosis Date  . CHF (congestive heart failure) ([place])   . Hypertension     There are no active problems to display for this patient.   Past Surgical History:  Procedure Laterality Date  . ABDOMINAL HYSTERECTOMY    . COLONOSCOPY WITH PROPOFOL N/A 03/25/2015   Procedure: COLONOSCOPY WITH PROPOFOL;  Surgeon: Lollie Sails, MD;  Location: [facility] ENDOSCOPY;  Service: Endoscopy;  Laterality: N/A;    Prior to Admission medications   Medication Sig Start Date End Date Taking? Authorizing Provider  aspirin 81 MG tablet Take 81 mg by mouth daily.    [provider]  furosemide (LASIX) 20 MG tablet Take 20 mg by mouth.    [provider]  lisinopril (PRINIVIL,ZESTRIL) 5 MG tablet Take 5 mg by mouth daily.    [provider]  metoprolol succinate (TOPROL-XL) 25 MG 24 hr tablet Take 25 mg by mouth daily.    [provider]  potassium chloride (K-DUR,KLOR-CON) 10 MEQ tablet Take 20 mEq by mouth daily.    [provider]  terbinafine (LAMISIL) 250 MG tablet Take 250 mg by mouth daily.    [provider]    Allergies Floxin [ofloxacin]  Family History  Problem Relation Age of Onset  . Breast cancer Neg Hx     Social History Social History   Tobacco Use  . Smoking status: Never Smoker  Substance Use Topics  . Alcohol use: Not on file  . Drug use: Not on file      Review of Systems Constitutional: No fever/chills, positive generalized weakness Eyes: No visual changes. ENT: No sore throat. Cardiovascular: No chest pain Respiratory: Positive for SOB,  Gastrointestinal: No abdominal pain.  No nausea, no vomiting.  No diarrhea.  No constipation. Genitourinary: Negative for dysuria. Musculoskeletal: Negative for back pain. Skin: Negative for rash. Neurological: Negative for headaches, focal weakness or numbness. All other ROS negative ____________________________________________   PHYSICAL EXAM:  VITAL SIGNS: Blood pressure (!) 115/50, pulse 95, temperature 99.5 F (37.5 C), temperature source Oral, resp. rate (!) 27, height 5\' 3"  (1.6 m), weight 109.8 kg, SpO2 95 %.  Constitutional: Alert and oriented. Well appearing and in no acute distress. Eyes: Conjunctivae are normal. EOMI. Head: Atraumatic. Nose: No congestion/rhinnorhea. Mouth/Throat: Mucous membranes are moist.   Neck: No stridor. Trachea Midline. FROM  Cardiovascular: Normal rate, regular rhythm. Grossly normal heart sounds.  Good peripheral circulation. Respiratory: Placed on 4 L nasal cannula due to hypoxia.  No increased work of breathing.  No stridor. Gastrointestinal: Soft and nontender. No distention. No abdominal bruits.  Musculoskeletal: No lower extremity tenderness nor edema.  No joint effusions. Neurologic:  Normal speech and language. No gross focal neurologic deficits are appreciated.   Skin:  Skin is warm, dry and intact. No rash noted. Psychiatric: Mood and affect are normal. Speech and behavior are normal. GU: Deferred   ____________________________________________   LABS (all labs ordered are listed, but only abnormal results are displayed)  Labs Reviewed  CBC WITH DIFFERENTIAL/PLATELET - Abnormal; Notable for the following components:      Result Value   MCV 78.7 (*)    MCH 25.4 (*)    All other components within normal limits  COMPREHENSIVE METABOLIC PANEL - Abnormal; Notable for the following components:   Sodium 131 (*)    Glucose, Bld 115 (*)    Calcium 8.3 (*)    Total Protein 6.2 (*)    Albumin 3.0 (*)    All other components within normal limits  FIBRIN DERIVATIVES D-DIMER ([facility] ONLY) - Abnormal; Notable for the following components:   Fibrin derivatives D-dimer ([facility]) 1,284.41 (*)    All other components within normal limits  LACTATE DEHYDROGENASE - Abnormal; Notable for the following components:   LDH 249 (*)    All other components within normal limits  TRIGLYCERIDES - Abnormal; Notable for the following components:   Triglycerides 277 (*)    All other components within normal limits  BRAIN NATRIURETIC PEPTIDE - Abnormal; Notable for the following components:   B Natriuretic Peptide 108.0 (*)    All other components within normal limits  TROPONIN I (HIGH SENSITIVITY) - Abnormal; Notable for the following components:   Troponin I (High Sensitivity) 53 (*)    All other components within normal limits  TROPONIN I (HIGH SENSITIVITY) - Abnormal; Notable for the following components:   Troponin I (High Sensitivity) 48 (*)    All other components within normal limits  SARS CORONAVIRUS 2 (TAT 6-24 HRS)  CULTURE, BLOOD (ROUTINE X 2)  CULTURE, BLOOD (ROUTINE X 2)  LACTIC ACID, PLASMA  PROCALCITONIN  FERRITIN  FIBRINOGEN  LACTIC ACID, PLASMA  C-REACTIVE PROTEIN   ____________________________________________   ED ECG REPORT I, Vanessa Oliver,  the attending physician, personally viewed and interpreted this ECG.  EKG is normal sinus rate of 98, no ST elevation, no T wave inversion in lead III, normal intervals ____________________________________________  RADIOLOGY Robert Bellow, personally viewed and evaluated these images (plain radiographs) as part of my medical decision making, as well as reviewing the written report by the radiologist.  ED MD interpretation: Bilateral infiltrates.  Official radiology report(s): Dg Chest Port 1 View  Result Date: 06/21/2019 CLINICAL DATA:  COVID-19 positive, weakness, cramping of the legs, increasing shortness of breath, worsening weakness, history CHF, hypertension EXAM: PORTABLE CHEST 1 VIEW COMPARISON:  Portable exam 1552 hours compared to 08/11/2011 FINDINGS: Upper normal heart size. Mediastinal contours normal. Patchy airspace infiltrates bilaterally consistent with pneumonia. No pleural effusion or pneumothorax. Osseous structures unremarkable. IMPRESSION: Patchy BILATERAL pulmonary infiltrates consistent with pneumonia. Electronically Signed   By: Lavonia Dana M.D.   On: 06/21/2019 16:02    ____________________________________________   PROCEDURES  Procedure(s) performed (including Critical Care):  .Critical Care Performed by: Vanessa Armstrong, MD Authorized by: Vanessa Lakeway, MD  Critical care provider statement:    Critical care time (minutes):  31   Critical care was necessary to treat or prevent imminent or life-threatening deterioration of the following conditions:  Respiratory failure   Critical care was time spent personally by me on the following activities:  Discussions with consultants, evaluation of patient's response to treatment, examination of patient, ordering and performing treatments and interventions, ordering and review of laboratory studies, ordering and review of radiographic studies, pulse oximetry, re-evaluation of patient's condition, obtaining history from  patient or surrogate and review of old charts     ____________________________________________   INITIAL IMPRESSION / ASSESSMENT AND PLAN / ED COURSE   Erika Schmidt was evaluated in Emergency Department on 06/21/2019 for the symptoms described in the history of present illness. She was evaluated in the context of the global COVID-19 pandemic, which necessitated consideration that the patient might be at risk for infection with the SARS-CoV-2 virus that causes COVID-19. Institutional protocols and algorithms that pertain to the evaluation of patients at risk for COVID-19 are in a state of rapid change based on information released by regulatory bodies including the CDC and federal and state organizations. These policies and algorithms were followed during the patient's care in the ED.   Pt presents with SOB.  Patient presents with significant hypoxia and placed on 4 L.  Most likely secondary to coronavirus.  She denies any other risk factors for PE so we will hold off on CT scan.  Will get cardiac markers to evaluate for ACS but no chest pain.  Will get labs to evaluate for electrolyte abnormalities, AKI, anemia.  Patient has slightly elevated troponin at 53.  Chest x-ray consistent with coronavirus.  Procalcitonin was negative so we will hold off on antibiotics.  We will discuss with [facility] for transfer.   5:15 PM discussed with Dr. Candiss Norse from [facility] for transfer.  Slightly elevated troponin and he request that patient have a second troponin to ensure there is no significant elevation given the cardiology there.  This most likely secondary from demand from her coronavirus.  Troponin is downtrending to 48 therefore patient does not need cardiology consult.  This is most likely demand from her infection.  Patient will be admitted to [facility] for further treatment   ____________________________________________   FINAL CLINICAL IMPRESSION(S) / ED DIAGNOSES    Final diagnoses:  COVID-19  Acute respiratory failure with hypoxia ([place])     MEDICATIONS GIVEN DURING THIS VISIT:  Medications  methylPREDNISolone sodium succinate (SOLU-MEDROL) 125 mg/2 mL injection 60 mg (60 mg Intravenous Given 06/21/19 1728)  remdesivir 200 mg in sodium chloride 0.9 % 250 mL IVPB (has no administration in time range)  sodium chloride 0.9 % bolus 500 mL (has no administration in time range)     ED Discharge Orders    None       Note:  This document was prepared using Dragon voice recognition software and may include unintentional dictation errors.   Vanessa Limestone, MD 06/21/19 [id number]

## 2019-06-21 NOTE — ED Notes (Signed)
Pt hit call bell requesting to use restroom. Pt kept on oxygen while taking a few steps from bed to toilet. Pt became SOB and RR increased. When pt got up from toilet and sat back down on bed pt oxygen in mid 70's. Pt oxygen through [place] increased from 4 L to 6L. Pt came up to 91%. Will continue to monitor.

## 2019-06-21 NOTE — Progress Notes (Addendum)
° ° °  Erika Schmidt, is a 63 y.o. female, DOB - [birth year], [id number]  With history of diastolic CHF, hypertension, obesity, dyslipidemia who was diagnosed Covid +1-week ago at her outpatient urgent care presented to [facility] ER with shortness of breath, found to be hypoxic requiring 4 L nasal cannula.  CRP is pending.  EKG is nonacute, chest x-ray is positive.  Patient stable on 4 L.  Patient accepted to telemetry bed.  First high-sensitivity troponin borderline at 53, requested the ED physician to repeat a second i-STAT troponin and if the trend is stable to send the patient to [facility].  If the second troponin is remarkably higher then requested to have cardiology see the patient at [facility] and then sent to [facility] post clearance. Have ordered IV steroids and Remdesivir.    They will also provide documentation of a positive test before the patient is transferred.  Apparently Craig Beach RN in the ER is getting a faxed copy of the same.   Vitals:   06/21/19 1525 06/21/19 1529 06/21/19 1530 06/21/19 1600  BP:  (!) 115/50 (!) 103/46 98/62  Pulse:   92   Resp:   19 (!) 29  Temp:      TempSrc:      SpO2:   93%   Weight: 109.8 kg     Height: 5\' 3"  (1.6 m)           Data Review   Micro Results No results found for this or any previous visit (from the past 240 hour(s)).  Radiology Reports Dg Chest Port 1 View  Result Date: 06/21/2019 CLINICAL DATA:  COVID-19 positive, weakness, cramping of the legs, increasing shortness of breath, worsening weakness, history CHF, hypertension EXAM: PORTABLE CHEST 1 VIEW COMPARISON:  Portable exam 1552 hours compared to 08/11/2011 FINDINGS: Upper normal heart size. Mediastinal contours normal. Patchy airspace infiltrates bilaterally consistent with pneumonia. No pleural effusion or pneumothorax. Osseous structures unremarkable. IMPRESSION: Patchy BILATERAL pulmonary infiltrates  consistent with pneumonia. Electronically Signed   By: Lavonia Dana M.D.   On: 06/21/2019 16:02    CBC Recent Labs  Lab 06/21/19 1532  WBC 8.6  HGB 13.0  HCT 40.2  PLT 247  MCV 78.7*  MCH 25.4*  MCHC 32.3  RDW 14.5  LYMPHSABS 1.4  MONOABS 0.8  EOSABS 0.1  BASOSABS 0.0    Chemistries  Recent Labs  Lab 06/21/19 1532  NA 131*  K 3.8  CL 99  CO2 22  GLUCOSE 115*  BUN 22  CREATININE 0.89  CALCIUM 8.3*  AST 29  ALT 16  ALKPHOS 63  BILITOT 0.7   ------------------------------------------------------------------------------------------------------------------ estimated creatinine clearance is 77 mL/min (by C-G formula based on SCr of 0.89 mg/dL). ------------------------------------------------------------------------------------------------------------------ No results for input(s): HGBA1C in the last 72 hours. ------------------------------------------------------------------------------------------------------------------ Recent Labs    06/21/19 1532  TRIG 277*   ------------------------------------------------------------------------------------------------------------------ No results for input(s): TSH, T4TOTAL, T3FREE, THYROIDAB in the last 72 hours.  Invalid input(s): FREET3 ------------------------------------------------------------------------------------------------------------------ Recent Labs    06/21/19 1532  FERRITIN 182    Coagulation profile No results for input(s): INR, PROTIME in the last 168 hours.  No results for input(s): DDIMER in the last 72 hours.  Cardiac Enzymes No results for input(s): CKMB, TROPONINI, MYOGLOBIN in the last 168 hours.  Invalid input(s): CK ------------------------------------------------------------------------------------------------------------------ Invalid input(s): POCBNP

## 2019-06-21 NOTE — ED Notes (Signed)
COVID + results received from Alex faxed to [phone] [facility], transfer center) by this RN.

## 2019-06-21 NOTE — ED Notes (Addendum)
Upon arrival pt O2 saturations 63% on RA. Pt placed on 4L [place] and oxygen saturation is 95%.

## 2019-06-22 ENCOUNTER — Encounter: Payer: Self-pay | Admitting: Internal Medicine

## 2019-06-22 ENCOUNTER — Encounter (HOSPITAL_COMMUNITY): Payer: Self-pay

## 2019-06-22 ENCOUNTER — Inpatient Hospital Stay: Payer: Self-pay

## 2019-06-22 ENCOUNTER — Inpatient Hospital Stay (HOSPITAL_COMMUNITY)
Admission: AD | Admit: 2019-06-22 | Discharge: 2019-06-27 | DRG: 177 | Disposition: A | Discharge status: Home Health | Payer: 59 | Source: Other Acute Inpatient Hospital | Attending: Internal Medicine | Admitting: Internal Medicine

## 2019-06-22 ENCOUNTER — Inpatient Hospital Stay (HOSPITAL_COMMUNITY): Payer: 59

## 2019-06-22 DIAGNOSIS — U071 COVID-19: Principal | ICD-10-CM | POA: Diagnosis present

## 2019-06-22 DIAGNOSIS — I5033 Acute on chronic diastolic (congestive) heart failure: Secondary | ICD-10-CM | POA: Diagnosis present

## 2019-06-22 DIAGNOSIS — J1289 Other viral pneumonia: Secondary | ICD-10-CM | POA: Diagnosis present

## 2019-06-22 DIAGNOSIS — Z79899 Other long term (current) drug therapy: Secondary | ICD-10-CM

## 2019-06-22 DIAGNOSIS — I110 Hypertensive heart disease with heart failure: Secondary | ICD-10-CM | POA: Diagnosis present

## 2019-06-22 DIAGNOSIS — Z90710 Acquired absence of both cervix and uterus: Secondary | ICD-10-CM | POA: Diagnosis not present

## 2019-06-22 DIAGNOSIS — J9601 Acute respiratory failure with hypoxia: Secondary | ICD-10-CM | POA: Diagnosis present

## 2019-06-22 DIAGNOSIS — N390 Urinary tract infection, site not specified: Secondary | ICD-10-CM | POA: Diagnosis present

## 2019-06-22 DIAGNOSIS — Z7982 Long term (current) use of aspirin: Secondary | ICD-10-CM | POA: Diagnosis not present

## 2019-06-22 DIAGNOSIS — R0602 Shortness of breath: Secondary | ICD-10-CM | POA: Diagnosis not present

## 2019-06-22 DIAGNOSIS — J1282 Pneumonia due to coronavirus disease 2019: Secondary | ICD-10-CM | POA: Insufficient documentation

## 2019-06-22 DIAGNOSIS — Z6841 Body Mass Index (BMI) 40.0 and over, adult: Secondary | ICD-10-CM

## 2019-06-22 LAB — CBC
HCT: 46.6 % — ABNORMAL HIGH (ref 36.0–46.0)
Hemoglobin: 15 g/dL (ref 12.0–15.0)
MCH: 26.3 pg (ref 26.0–34.0)
MCHC: 32.2 g/dL (ref 30.0–36.0)
MCV: 81.6 fL (ref 80.0–100.0)
Platelets: 221 10*3/uL (ref 150–400)
RBC: 5.71 MIL/uL — ABNORMAL HIGH (ref 3.87–5.11)
RDW: 14.7 % (ref 11.5–15.5)
WBC: 11.5 10*3/uL — ABNORMAL HIGH (ref 4.0–10.5)
nRBC: 0 % (ref 0.0–0.2)

## 2019-06-22 LAB — COMPREHENSIVE METABOLIC PANEL
ALT: 17 U/L (ref 0–44)
AST: 29 U/L (ref 15–41)
Albumin: 3.1 g/dL — ABNORMAL LOW (ref 3.5–5.0)
Alkaline Phosphatase: 66 U/L (ref 38–126)
Anion gap: 12 (ref 5–15)
BUN: 19 mg/dL (ref 8–23)
CO2: 20 mmol/L — ABNORMAL LOW (ref 22–32)
Calcium: 8.4 mg/dL — ABNORMAL LOW (ref 8.9–10.3)
Chloride: 104 mmol/L (ref 98–111)
Creatinine, Ser: 0.85 mg/dL (ref 0.44–1.00)
GFR calc Af Amer: >60 mL/min (ref >60)
GFR calc non Af Amer: >60 mL/min (ref >60)
Glucose, Bld: 155 mg/dL — ABNORMAL HIGH (ref 70–99)
Potassium: 4.7 mmol/L (ref 3.5–5.1)
Sodium: 136 mmol/L (ref 135–145)
Total Bilirubin: 1 mg/dL (ref 0.3–1.2)
Total Protein: 6.6 g/dL (ref 6.5–8.1)

## 2019-06-22 LAB — ABO/RH: ABO/RH(D): B POS

## 2019-06-22 LAB — TYPE AND SCREEN
ABO/RH(D): B POS
Antibody Screen: NEGATIVE

## 2019-06-22 LAB — GLUCOSE, CAPILLARY
Glucose-Capillary: 148 mg/dL — ABNORMAL HIGH (ref 70–99)
Glucose-Capillary: 131 mg/dL — ABNORMAL HIGH (ref 70–99)
Glucose-Capillary: 108 mg/dL — ABNORMAL HIGH (ref 70–99)

## 2019-06-22 LAB — HEMOGLOBIN A1C
Hgb A1c MFr Bld: 6.1 % — ABNORMAL HIGH (ref 4.8–5.6)
Mean Plasma Glucose: 128.37 mg/dL

## 2019-06-22 LAB — BRAIN NATRIURETIC PEPTIDE: B Natriuretic Peptide: 252.2 pg/mL — ABNORMAL HIGH (ref 0.0–100.0)

## 2019-06-22 LAB — D-DIMER, QUANTITATIVE: D-Dimer, Quant: 6.57 ug/mL-FEU — ABNORMAL HIGH (ref 0.00–0.50)

## 2019-06-22 LAB — PROCALCITONIN: Procalcitonin: <0.1 ng/mL

## 2019-06-22 LAB — C-REACTIVE PROTEIN: CRP: 1.8 mg/dL — ABNORMAL HIGH (ref <1.0)

## 2019-06-22 MED ORDER — ONDANSETRON HCL 4 MG PO TABS: 4.0000 mg | ORAL_TABLET | Freq: Four times a day (QID) | ORAL | Status: DC | PRN

## 2019-06-22 MED ORDER — FAMOTIDINE 20 MG PO TABS
20.0000 mg | ORAL_TABLET | Freq: Every day | ORAL | Status: DC
  Administered 2019-06-22 – 2019-06-27 (×6): 20 mg via ORAL
  Filled 2019-06-22 (×6): qty 1

## 2019-06-22 MED ORDER — METOPROLOL SUCCINATE ER 25 MG PO TB24
25.0000 mg | ORAL_TABLET | Freq: Every day | ORAL | Status: DC
  Administered 2019-06-22 – 2019-06-27 (×6): 25 mg via ORAL
  Filled 2019-06-22 (×6): qty 1

## 2019-06-22 MED ORDER — SODIUM CHLORIDE 0.9% IV SOLUTION: Freq: Once | INTRAVENOUS | Status: DC

## 2019-06-22 MED ORDER — SODIUM CHLORIDE 0.9% FLUSH: 10.0000 mL | INTRAVENOUS | Status: DC | PRN

## 2019-06-22 MED ORDER — ENOXAPARIN SODIUM 60 MG/0.6ML ~~LOC~~ SOLN
55.0000 mg | SUBCUTANEOUS | Status: DC
  Administered 2019-06-22 – 2019-06-27 (×6): 55 mg via SUBCUTANEOUS
  Filled 2019-06-22 (×6): qty 0.6

## 2019-06-22 MED ORDER — HYDROCOD POLST-CPM POLST ER 10-8 MG/5ML PO SUER
5.0000 mL | Freq: Two times a day (BID) | ORAL | Status: DC | PRN
  Administered 2019-06-22: 5 mL via ORAL
  Filled 2019-06-22: qty 5

## 2019-06-22 MED ORDER — SODIUM CHLORIDE 0.9 % IV SOLN: 250.0000 mL | INTRAVENOUS | Status: DC | PRN

## 2019-06-22 MED ORDER — SODIUM CHLORIDE 0.9% FLUSH: 3.0000 mL | INTRAVENOUS | Status: DC | PRN

## 2019-06-22 MED ORDER — CHLORHEXIDINE GLUCONATE CLOTH 2 % EX PADS
6.0000 | MEDICATED_PAD | Freq: Every day | CUTANEOUS | Status: DC
  Administered 2019-06-22 – 2019-06-27 (×6): 6 via TOPICAL

## 2019-06-22 MED ORDER — INSULIN ASPART 100 UNIT/ML ~~LOC~~ SOLN: 0.0000 [IU] | Freq: Every day | SUBCUTANEOUS | Status: DC

## 2019-06-22 MED ORDER — ACETAMINOPHEN 325 MG PO TABS
650.0000 mg | ORAL_TABLET | Freq: Four times a day (QID) | ORAL | Status: DC | PRN
  Administered 2019-06-23 – 2019-06-25 (×4): 650 mg via ORAL
  Filled 2019-06-22 (×4): qty 2

## 2019-06-22 MED ORDER — POLYETHYLENE GLYCOL 3350 17 G PO PACK: 17.0000 g | PACK | Freq: Every day | ORAL | Status: DC | PRN

## 2019-06-22 MED ORDER — ALBUTEROL SULFATE HFA 108 (90 BASE) MCG/ACT IN AERS
2.0000 | INHALATION_SPRAY | Freq: Four times a day (QID) | RESPIRATORY_TRACT | Status: DC
  Administered 2019-06-22 – 2019-06-27 (×20): 2 via RESPIRATORY_TRACT
  Filled 2019-06-22: qty 6.7

## 2019-06-22 MED ORDER — METHYLPREDNISOLONE SODIUM SUCC 125 MG IJ SOLR
80.0000 mg | Freq: Two times a day (BID) | INTRAMUSCULAR | Status: DC
  Administered 2019-06-22 – 2019-06-23 (×3): 80 mg via INTRAVENOUS
  Filled 2019-06-22 (×4): qty 2

## 2019-06-22 MED ORDER — METHYLPREDNISOLONE SODIUM SUCC 125 MG IJ SOLR: 60.0000 mg | Freq: Two times a day (BID) | INTRAMUSCULAR | Status: DC

## 2019-06-22 MED ORDER — GUAIFENESIN-DM 100-10 MG/5ML PO SYRP
5.0000 mL | ORAL_SOLUTION | ORAL | Status: DC | PRN
  Administered 2019-06-23 – 2019-06-26 (×5): 5 mL via ORAL
  Filled 2019-06-22 (×5): qty 10

## 2019-06-22 MED ORDER — ORAL CARE MOUTH RINSE
15.0000 mL | Freq: Two times a day (BID) | OROMUCOSAL | Status: DC
  Administered 2019-06-23 – 2019-06-27 (×9): 15 mL via OROMUCOSAL

## 2019-06-22 MED ORDER — ONDANSETRON HCL 4 MG/2ML IJ SOLN
4.0000 mg | Freq: Four times a day (QID) | INTRAMUSCULAR | Status: DC | PRN
  Administered 2019-06-22: 4 mg via INTRAVENOUS
  Filled 2019-06-22: qty 2

## 2019-06-22 MED ORDER — SODIUM CHLORIDE 0.9% FLUSH
3.0000 mL | Freq: Two times a day (BID) | INTRAVENOUS | Status: DC
  Administered 2019-06-22: 3 mL via INTRAVENOUS

## 2019-06-22 MED ORDER — METHYLPREDNISOLONE SODIUM SUCC 125 MG IJ SOLR
60.0000 mg | Freq: Two times a day (BID) | INTRAMUSCULAR | Status: DC
  Administered 2019-06-22: 60 mg via INTRAVENOUS
  Filled 2019-06-22: qty 2

## 2019-06-22 MED ORDER — ATORVASTATIN CALCIUM 40 MG PO TABS
40.0000 mg | ORAL_TABLET | Freq: Every day | ORAL | Status: DC
  Administered 2019-06-22 – 2019-06-26 (×5): 40 mg via ORAL
  Filled 2019-06-22 (×5): qty 1

## 2019-06-22 MED ORDER — ASPIRIN 81 MG PO TBEC
81.0000 mg | DELAYED_RELEASE_TABLET | Freq: Every day | ORAL | Status: DC
  Administered 2019-06-22 – 2019-06-27 (×6): 81 mg via ORAL
  Filled 2019-06-22 (×11): qty 1

## 2019-06-22 MED ORDER — SODIUM CHLORIDE 0.9% FLUSH
10.0000 mL | Freq: Two times a day (BID) | INTRAVENOUS | Status: DC
  Administered 2019-06-22 – 2019-06-27 (×10): 10 mL

## 2019-06-22 MED ORDER — SODIUM CHLORIDE 0.9 % IV SOLN
100.0000 mg | Freq: Every day | INTRAVENOUS | Status: AC
  Administered 2019-06-22 – 2019-06-25 (×4): 100 mg via INTRAVENOUS
  Filled 2019-06-22: qty 100
  Filled 2019-06-22: qty 20
  Filled 2019-06-22: qty 100
  Filled 2019-06-22: qty 20

## 2019-06-22 MED ORDER — FUROSEMIDE 10 MG/ML IJ SOLN
60.0000 mg | Freq: Once | INTRAMUSCULAR | Status: AC
  Administered 2019-06-22: 60 mg via INTRAVENOUS
  Filled 2019-06-22: qty 6

## 2019-06-22 MED ORDER — INSULIN ASPART 100 UNIT/ML ~~LOC~~ SOLN
0.0000 [IU] | Freq: Three times a day (TID) | SUBCUTANEOUS | Status: DC
  Administered 2019-06-22 – 2019-06-24 (×5): 1 [IU] via SUBCUTANEOUS
  Administered 2019-06-24: 2 [IU] via SUBCUTANEOUS
  Administered 2019-06-25: 1 [IU] via SUBCUTANEOUS
  Administered 2019-06-25: 2 [IU] via SUBCUTANEOUS
  Administered 2019-06-25 – 2019-06-27 (×5): 1 [IU] via SUBCUTANEOUS

## 2019-06-22 NOTE — ED Notes (Signed)
Pt resting quietly with eyes closed without distress noted.

## 2019-06-22 NOTE — ED Notes (Signed)
Pt resting comfortably in bed, awaiting transfer to [facility]. Per carelink transport may be around 0700 am, patient made aware. Pt denies any pain or shob, o2@6l  per [place], turned down to 4l. Will see how patient tolerates. Pt positioned for comfort, call bell at bedside. Purewick in place and attached to suction. Will continue to monitor.

## 2019-06-22 NOTE — ED Notes (Signed)
Pt sats 97% on o2 at 4l per [place].

## 2019-06-22 NOTE — ED Notes (Signed)
Increased pt O2 to 5 L.  Dr Charna Archer aware.

## 2019-06-22 NOTE — Progress Notes (Signed)
Peripherally Inserted Central Catheter/Midline Placement  The IV Nurse has discussed with the patient and/or persons authorized to consent for the patient, the purpose of this procedure and the potential benefits and risks involved with this procedure.  The benefits include less needle sticks, lab draws from the catheter, and the patient may be discharged home with the catheter. Risks include, but not limited to, infection, bleeding, blood clot (thrombus formation), and puncture of an artery; nerve damage and irregular heartbeat and possibility to perform a PICC exchange if needed/ordered by physician.  Alternatives to this procedure were also discussed.  Bard Power PICC patient education guide, fact sheet on infection prevention and patient information card has been provided to patient /or left at bedside.    PICC/Midline Placement Documentation  PICC Single Lumen 06/22/19 PICC Right Cephalic 38 cm 2 cm (Active)  Indication for Insertion or Continuance of Line Vasoactive infusions;Prolonged intravenous therapies 06/22/19 1627  Exposed Catheter (cm) 2 cm 06/22/19 1627  Site Assessment Clean;Dry;Intact 06/22/19 1627  Line Status Flushed;Saline locked;Blood return noted 06/22/19 1627  Dressing Type Transparent 06/22/19 1627  Dressing Status Clean;Dry;Intact;Antimicrobial disc in place 06/22/19 1627  Dressing Change Due 06/29/19 06/22/19 1627       Gordan Payment 06/22/2019, 4:28 PM

## 2019-06-22 NOTE — ED Provider Notes (Signed)
-----------------------------------------   6:34 AM on 06/22/2019 -----------------------------------------  No events overnight.  Patient remains in the ED awaiting transport to [facility].   Paulette Blanch, MD 06/22/19 [phone]

## 2019-06-22 NOTE — ED Notes (Signed)
No change in condition, pt resting comfortably awaiting transport to [facility].

## 2019-06-22 NOTE — ED Notes (Signed)
[facility] at bedside for transport of pt to [facility]. VSS on 5 L [place].  Pt remains alert and oriented.

## 2019-06-22 NOTE — ED Notes (Signed)
Verbal consent given by patient for transfer to [facility].

## 2019-06-22 NOTE — H&P (Addendum)
TRH H&P   Patient Demographics:    Erika Schmidt, is a 63 y.o. female  MRN: [id number]   DOB - 03/21/1956  Admit Date - 06/22/2019  Outpatient Primary MD for the patient is Petra Kuba, MD   Patient coming from: [facility]  CC - Fever    HPI:    Erika Schmidt  is a 63 y.o. female, with H/O HTN, Chronic diastolic CHF EF 12 to [id number] on echocardiogram noted from outside records, morbid obesity, patient was doing fairly well until about 5 to 6 days ago she and her husband started feeling poorly after getting flu shot, she was diagnosed with COVID-19 infection at [facility] urgent care on the seventh of this month, she tried to stay at home and take care of herself however she started feeling more short of breath and feverish hence came to [facility] ER yesterday evening where she was diagnosed with COVID-19 pneumonia, acute hypoxic respiratory failure, was placed on 4 L nasal cannula oxygen and transferred to [facility].  She arrived to [facility] few hours ago and was placed under my care today.  Patient currently besides shortness of breath has no other subjective complaints, denies any headache, no chest or abdominal pain, no focal weakness, no diarrhea or dysuria.      Review of systems:    A full 10 point Review of Systems was done, except as stated above, all other Review of Systems were negative.   With Past History of the following :    Past Medical History:  Diagnosis Date  . CHF (congestive heart failure) ([place])   . Hypertension       Past Surgical History:  Procedure Laterality Date  . ABDOMINAL HYSTERECTOMY    . COLONOSCOPY WITH PROPOFOL N/A 03/25/2015   Procedure: COLONOSCOPY WITH PROPOFOL;   Surgeon: Lollie Sails, MD;  Location: [facility] ENDOSCOPY;  Service: Endoscopy;  Laterality: N/A;      Social History:     Social History   Tobacco Use  . Smoking status: Never Smoker  . Smokeless tobacco: Never Used  Substance Use Topics  . Alcohol use: Not on file         Family History :     Family History  Problem Relation Age of Onset  . Breast cancer Neg Hx  Home Medications:   Prior to Admission medications   Medication Sig Start Date End Date Taking? Authorizing Provider  aspirin 81 MG tablet Take 81 mg by mouth daily.    [provider]  furosemide (LASIX) 20 MG tablet Take 20 mg by mouth daily.     [provider]  lisinopril (PRINIVIL,ZESTRIL) 5 MG tablet Take 5 mg by mouth daily.    [provider]  metoprolol succinate (TOPROL-XL) 25 MG 24 hr tablet Take 25 mg by mouth daily.    [provider]  potassium chloride (K-DUR,KLOR-CON) 10 MEQ tablet Take 20 mEq by mouth daily.    [provider]     Allergies:     Allergies  Allergen Reactions  . Floxin [Ofloxacin]      Physical Exam:   Vitals  Blood pressure 124/69, pulse 83, temperature 98.7 F (37.1 C), temperature source Oral, resp. rate (!) 22, height 5\' 3"  (1.6 m), weight 110 kg, SpO2 90 %.  SpO2: 90 % O2 Flow Rate (L/min): 7 L/min   1. General, middle-aged obese African-American female lying in hospital bed in some respiratory distress.  2. Normal affect and insight, Not Suicidal or Homicidal, Awake Alert, Oriented X 3.  3. No F.N deficits, ALL C.Nerves Intact, Strength 5/5 all 4 extremities, Sensation intact all 4 extremities, Plantars down going.  4. Ears and Eyes appear Normal, Conjunctivae clear, PERRLA. Moist Oral Mucosa.  5. Supple Neck, No JVD, No cervical lymphadenopathy appriciated, No Carotid Bruits.  6. Symmetrical Chest wall movement, Good air movement bilaterally, few bibasilar crackles,  7. RRR, No Gallops, Rubs or  Murmurs, No Parasternal Heave.  8. Positive Bowel Sounds, Abdomen Soft, No tenderness, No organomegaly appriciated,No rebound -guarding or rigidity.  9.  No Cyanosis, Normal Skin Turgor, No Skin Rash or Bruise.  10. Good muscle tone,  joints appear normal , no effusions, Normal ROM.  11. No Palpable Lymph Nodes in Neck or Axillae      Data Review:    CBC Recent Labs  Lab 06/21/19 1532  WBC 8.6  HGB 13.0  HCT 40.2  PLT 247  MCV 78.7*  MCH 25.4*  MCHC 32.3  RDW 14.5  LYMPHSABS 1.4  MONOABS 0.8  EOSABS 0.1  BASOSABS 0.0   ------------------------------------------------------------------------------------------------------------------  Chemistries  Recent Labs  Lab 06/21/19 1532  NA 131*  K 3.8  CL 99  CO2 22  GLUCOSE 115*  BUN 22  CREATININE 0.89  CALCIUM 8.3*  AST 29  ALT 16  ALKPHOS 63  BILITOT 0.7   ------------------------------------------------------------------------------------------------------------------ estimated creatinine clearance is 77 mL/min (by C-G formula based on SCr of 0.89 mg/dL). ------------------------------------------------------------------------------------------------------------------ No results for input(s): TSH, T4TOTAL, T3FREE, THYROIDAB in the last 72 hours.  Invalid input(s): FREET3  Coagulation profile No results for input(s): INR, PROTIME in the last 168 hours. ------------------------------------------------------------------------------------------------------------------- No results for input(s): DDIMER in the last 72 hours. -------------------------------------------------------------------------------------------------------------------  Cardiac Enzymes No results for input(s): CKMB, TROPONINI, MYOGLOBIN in the last 168 hours.  Invalid input(s): CK ------------------------------------------------------------------------------------------------------------------    Component Value Date/Time   BNP 108.0 (H)  06/21/2019 1532     Labs from today awaited will follow.   ----------------------------------------------------------------------------------------------------------------   Imaging Results:    Dg Chest Port 1 View  Result Date: 06/21/2019 CLINICAL DATA:  COVID-19 positive, weakness, cramping of the legs, increasing shortness of breath, worsening weakness, history CHF, hypertension EXAM: PORTABLE CHEST 1 VIEW COMPARISON:  Portable exam 1552 hours compared to 08/11/2011 FINDINGS: Upper normal heart size. Mediastinal  contours normal. Patchy airspace infiltrates bilaterally consistent with pneumonia. No pleural effusion or pneumothorax. Osseous structures unremarkable. IMPRESSION: Patchy BILATERAL pulmonary infiltrates consistent with pneumonia. Electronically Signed   By: Lavonia Dana M.D.   On: 06/21/2019 16:02   My personal review of EKG: Rhythm NSR, Rate  98 /min, non specific ST changes   Assessment & Plan:     1.  Acute hypoxic respiratory failure due to COVID-19 pneumonitis. She is quite hypoxic and currently requires 7 L of oxygen, she will be placed on combination of IV steroids, IV remdesivir, convalescent plasma.  Low threshold for adding Actemra.  Awaiting her blood work from today.  Overall her condition is tenuous.  She and her husband both agree for convalescent plasma use and Actemra use if needed.  Actemra off label use - patient was told that if COVID-19 pneumonitis gets worse we might potentially use Actemra off label, she denies any known history of tuberculosis or hepatitis, understands the risks and benefits and wants to proceed with Actemra treatment if required.   2.  Mild acute on chronic diastolic CHF last EF 55 to 60% in outside records.  Lasix IV and monitor.  Continue home dose beta-blocker.  3.  Hypertension.  For now continue beta-blocker and monitor.  4.  Morbid obesity.  Follow with PCP for weight loss.  5.  Mild rise in high-sensitivity troponin at  [facility].  Trend stable and not an ACS pattern, likely small troponin leak due to demand mismatch from hypoxia and tachycardia.  Continue aspirin beta-blocker and add statin for secondary prevention.  No chest pain and EKG is nonacute.     DVT Prophylaxis  Lovenox   AM Labs Ordered, also please review Full Orders  Family Communication: Admission, patients condition and plan of care including tests being ordered have been discussed with the patient and husband who indicate understanding and agree with the plan and Code Status including Actemra and convalescent plasma use.  Code Status Full  Likely DC to  Home likely  Condition GUARDED    Consults called: None    Admission status: Inpt    Time spent in minutes : 35   Lala Lund M.D on 06/22/2019 at 1:49 PM  To page go to [email or web address] - password [facility]

## 2019-06-22 NOTE — Progress Notes (Addendum)
1130: Pt arrived via EMS. Placed on bedside monitor and VS obtained. Pt dyspneic with exertion, SpO2 85% on 5 L [place]. Other VS WNL. Denies pain. Oriented to room and unit policies. Call bell within reach.   1300: MD made aware unable to draw blood for labs. Order placed for PICC. Pt does not want to get to chair at this time. Purewick in place for I&O's. Call bell within reach  1400: Pt made aware of risks and benefits of plasma. Pt agreed to treatment. Consent obtained for plasma. Consent placed in chart.   1545: IV team at bedside for PICC.   1820: Pt wants to remain in chair at this time. Resting comfortably eating. Will monitor  1855: Plasma started and infused for 15 minutes without complications. Will monitor

## 2019-06-22 NOTE — ED Notes (Signed)
Report to scott RN with [facility].

## 2019-06-23 LAB — URINALYSIS, ROUTINE W REFLEX MICROSCOPIC
Bilirubin Urine: NEGATIVE
Glucose, UA: NEGATIVE mg/dL
Hgb urine dipstick: NEGATIVE
Ketones, ur: NEGATIVE mg/dL
Leukocytes,Ua: NEGATIVE
Nitrite: NEGATIVE
Protein, ur: NEGATIVE mg/dL
Specific Gravity, Urine: 1.016 (ref 1.005–1.030)
pH: 5 (ref 5.0–8.0)

## 2019-06-23 LAB — GLUCOSE, CAPILLARY
Glucose-Capillary: 131 mg/dL — ABNORMAL HIGH (ref 70–99)
Glucose-Capillary: 104 mg/dL — ABNORMAL HIGH (ref 70–99)
Glucose-Capillary: 148 mg/dL — ABNORMAL HIGH (ref 70–99)
Glucose-Capillary: 133 mg/dL — ABNORMAL HIGH (ref 70–99)

## 2019-06-23 LAB — COMPREHENSIVE METABOLIC PANEL
ALT: 33 U/L (ref 0–44)
AST: 28 U/L (ref 15–41)
Albumin: 3.2 g/dL — ABNORMAL LOW (ref 3.5–5.0)
Alkaline Phosphatase: 72 U/L (ref 38–126)
Anion gap: 11 (ref 5–15)
BUN: 28 mg/dL — ABNORMAL HIGH (ref 8–23)
CO2: 25 mmol/L (ref 22–32)
Calcium: 8.5 mg/dL — ABNORMAL LOW (ref 8.9–10.3)
Chloride: 101 mmol/L (ref 98–111)
Creatinine, Ser: 1.06 mg/dL — ABNORMAL HIGH (ref 0.44–1.00)
GFR calc Af Amer: >60 mL/min (ref >60)
GFR calc non Af Amer: 56 mL/min — ABNORMAL LOW (ref >60)
Glucose, Bld: 151 mg/dL — ABNORMAL HIGH (ref 70–99)
Potassium: 4.6 mmol/L (ref 3.5–5.1)
Sodium: 137 mmol/L (ref 135–145)
Total Bilirubin: 0.7 mg/dL (ref 0.3–1.2)
Total Protein: 6.8 g/dL (ref 6.5–8.1)

## 2019-06-23 LAB — CBC WITH DIFFERENTIAL/PLATELET
Abs Immature Granulocytes: 0.21 10*3/uL — ABNORMAL HIGH (ref 0.00–0.07)
Basophils Absolute: 0 10*3/uL (ref 0.0–0.1)
Basophils Relative: 0 %
Eosinophils Absolute: 0 10*3/uL (ref 0.0–0.5)
Eosinophils Relative: 0 %
HCT: 41 % (ref 36.0–46.0)
Hemoglobin: 12.9 g/dL (ref 12.0–15.0)
Immature Granulocytes: 1 %
Lymphocytes Relative: 7 %
Lymphs Abs: 1.1 10*3/uL (ref 0.7–4.0)
MCH: 25.8 pg — ABNORMAL LOW (ref 26.0–34.0)
MCHC: 31.5 g/dL (ref 30.0–36.0)
MCV: 82 fL (ref 80.0–100.0)
Monocytes Absolute: 0.8 10*3/uL (ref 0.1–1.0)
Monocytes Relative: 5 %
Neutro Abs: 14.1 10*3/uL — ABNORMAL HIGH (ref 1.7–7.7)
Neutrophils Relative %: 87 %
Platelets: 286 10*3/uL (ref 150–400)
RBC: 5 MIL/uL (ref 3.87–5.11)
RDW: 14.6 % (ref 11.5–15.5)
WBC: 16.2 10*3/uL — ABNORMAL HIGH (ref 4.0–10.5)
nRBC: 0 % (ref 0.0–0.2)

## 2019-06-23 LAB — PREPARE FRESH FROZEN PLASMA: Unit division: 0

## 2019-06-23 LAB — BPAM FFP
Blood Product Expiration Date: 202011131721
ISSUE DATE / TIME: 202011121810
Unit Type and Rh: 7300

## 2019-06-23 LAB — HIV ANTIBODY (ROUTINE TESTING W REFLEX): HIV Screen 4th Generation wRfx: NONREACTIVE

## 2019-06-23 LAB — C-REACTIVE PROTEIN: CRP: 1.4 mg/dL — ABNORMAL HIGH (ref <1.0)

## 2019-06-23 LAB — BRAIN NATRIURETIC PEPTIDE: B Natriuretic Peptide: 426.1 pg/mL — ABNORMAL HIGH (ref 0.0–100.0)

## 2019-06-23 LAB — D-DIMER, QUANTITATIVE: D-Dimer, Quant: 1.37 ug/mL-FEU — ABNORMAL HIGH (ref 0.00–0.50)

## 2019-06-23 LAB — MAGNESIUM: Magnesium: 2.3 mg/dL (ref 1.7–2.4)

## 2019-06-23 MED ORDER — FUROSEMIDE 10 MG/ML IJ SOLN
40.0000 mg | Freq: Once | INTRAMUSCULAR | Status: AC
  Administered 2019-06-23: 40 mg via INTRAVENOUS
  Filled 2019-06-23: qty 4

## 2019-06-23 NOTE — Evaluation (Signed)
Physical Therapy Evaluation Patient Details Name: Erika Schmidt MRN: [id number] DOB: April 07, 1956 Today's Date: 06/23/2019   History of Present Illness  Pt is a 63 y.o. female admitted 06/22/19 with worsening SOB, dx with COVID-19. PMH includes HTN, CHF, obesity.  Clinical Impression  Pt presents with an overall decrease in functional mobility secondary to above. 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 Today, pt moving fairly well with intermittent min guard for balance; limited by decreased activity tolerance, requiring multiple rest breaks with minimal activity. SpO2 down to 70s on 8L O2 [place], returning to 89-90% on 8L with seated rest and deep breathing. Educ re: energy conservation, activity recommendations, IS/flutter valve use. Pt would benefit from continued acute PT services to maximize functional mobility and independence prior to d/c home.     Follow Up Recommendations No PT follow up;Supervision - Intermittent    Equipment Recommendations  None recommended by PT    Recommendations for Other Services       Precautions / Restrictions Precautions Precautions: Fall;Other (comment) Precaution Comments: Desats with activity Restrictions Weight Bearing Restrictions: No      Mobility  Bed Mobility Overal bed mobility: Independent                Transfers Overall transfer level: Needs assistance Equipment used: None Transfers: Sit to/from Stand;Stand Pivot Transfers Sit to Stand: Modified independent (Device/Increase time) Stand pivot transfers: Modified independent (Device/Increase time)       General transfer comment: Able to stand from EOB, low toilet height and recliner without physical assist. Able to demonstrate mod indep stand pivot transfer from recliner<>BSC  Ambulation/Gait Ambulation/Gait assistance: Supervision;Min guard Gait Distance (Feet): 60 Feet Assistive device: None Gait Pattern/deviations: Step-through  pattern;Decreased stride length;Wide base of support Gait velocity: Decreased Gait velocity interpretation: 1.31 - 2.62 ft/sec, indicative of limited community ambulator General Gait Details: Slow, fatigued gait in room without DME, pt intermittently reaching to furniture for support. SpO2 down to 70s on 8L O2 [place]; returning to 89-90% on 8L at rest  Stairs            Wheelchair Mobility    Modified Rankin (Stroke Patients Only)       Balance Overall balance assessment: Needs assistance   Sitting balance-Leahy Scale: Good     Standing balance support: No upper extremity supported;During functional activity Standing balance-Leahy Scale: Fair                               Pertinent Vitals/Pain Pain Assessment: Faces Faces Pain Scale: Hurts a little bit Pain Location: Headache Pain Descriptors / Indicators: Headache Pain Intervention(s): Premedicated before session    Home Living Family/patient expects to be discharged to:: Private residence Living Arrangements: Spouse/significant other Available Help at Discharge: Family;Available 24 hours/day Type of Home: House Home Access: Level entry     Home Layout: One level        Prior Function Level of Independence: Independent         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, newspaper        Extremity/Trunk Assessment   Upper Extremity Assessment Upper Extremity Assessment: Overall WFL for tasks assessed    Lower Extremity Assessment Lower Extremity Assessment: Overall WFL for tasks assessed       Communication   Communication: No difficulties  Cognition Arousal/Alertness: Awake/alert Behavior During Therapy: WFL for tasks assessed/performed;Flat affect Overall Cognitive Status: Within  Functional Limits for tasks assessed                                        General Comments General comments (skin integrity, edema, etc.): Practiced flutter valve and incentive  spirometer, pt giving little effort, reports fatigued from activity    Exercises     Assessment/Plan    PT Assessment Patient needs continued PT services  PT Problem List Decreased activity tolerance;Decreased balance;Decreased mobility;Cardiopulmonary status limiting activity       PT Treatment Interventions DME instruction;Gait training;Stair training;Functional mobility training;Therapeutic activities;Therapeutic exercise;Balance training;Patient/family education    PT Goals (Current goals can be found in the Care Plan section)  Acute Rehab PT Goals Patient Stated Goal: Return home, better breathing PT Goal Formulation: With patient Time For Goal Achievement: 07/07/19 Potential to Achieve Goals: Good    Frequency Min 3X/week   Barriers to discharge        Co-evaluation               AM-PAC PT "6 Clicks" Mobility  Outcome Measure Help needed turning from your back to your side while in a flat bed without using bedrails?: None Help needed moving from lying on your back to sitting on the side of a flat bed without using bedrails?: None Help needed moving to and from a bed to a chair (including a wheelchair)?: None Help needed standing up from a chair using your arms (e.g., wheelchair or bedside chair)?: None Help needed to walk in hospital room?: A Little Help needed climbing 3-5 steps with a railing? : A Little 6 Click Score: 22    End of Session Equipment Utilized During Treatment: Oxygen Activity Tolerance: Patient tolerated treatment well Patient left: in chair;with call bell/phone within reach Nurse Communication: Mobility status PT Visit Diagnosis: Other abnormalities of gait and mobility (R26.89)    Time: [id number] PT Time Calculation (min) (ACUTE ONLY): 28 min   Charges:   PT Evaluation $PT Eval Moderate Complexity: 1 Mod PT Treatments $Therapeutic Activity: 8-22 mins   Erika Schmidt, PT, DPT Acute Rehabilitation Services  Pager  [phone] Office [place] 06/23/2019, 11:45 AM

## 2019-06-23 NOTE — Progress Notes (Addendum)
0730: Assumed care of Pt from night RN. Pt alert, denies pain, O2 titrated down now SpO2 95% on 5 L [place], Pt desats quickly with minimal movement. Other VS WNL. Purewick in place, peri care performed, call bell within reach  1200: Pt informed MD ordered urine sample. Pt expressed desire to wait until later afternoon as she is exhausted and wants to rest before having to get up again. Currently on 7 L [place], Will monitor  1600: Pt assisted to [facility] for urine sample, then to bed. Pt tolerated activity better than previously. SpO2 dropped to 72%, but on 15L [place] Pt recovered quicker than earlier. Within 20 minutes was able to titrate back down to 6 L [place]. Medicine given for pre-emptive coughing per Pt request. Will monitor  1820: Pt comfortable, no needs at this time, call bell within reach.

## 2019-06-23 NOTE — Progress Notes (Signed)
Pt desats quickly with minimal exertion and takes a significant amount of time to recover. She is able to maintain O2 sats in the upper 90's on 6L [place] when lying still, but required up to 12L [facility] when turning onto her side in bed and 15L [facility] overnight when moving from the chair to bed. Will pass along to dayshift and continue to monitor oxygen saturations and increased need for O2 support.

## 2019-06-23 NOTE — Evaluation (Signed)
Occupational Therapy Evaluation Patient Details Name: Erika Schmidt MRN: [id number] DOB: 07-22-1956 Today's Date: 06/23/2019    History of Present Illness Pt is a 63 y.o. female admitted 06/22/19 with worsening SOB, dx with COVID-19. PMH includes HTN, CHF, obesity.   Clinical Impression   Pt admitted with above diagnoses, presenting with marked deficits with activity tolerance BADL endurance. 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 At time of eval, pt is overall mod I for BADL but requires supervision to manage O2 needs. Pt desats easily with any BADL activity. At time of arrival, pt on 6L HFNC. She de satted into the 70s standing at the sink, ultimately requiring 15L portable tank to sustain standing grooming and BSC transfer. On 15L [place] portable tank, pt still not satting above 85%. Once seated back in recliner, flutter and IS education given. Pt requiring ~3-5 minutes to recover and was able to be titrated back to 6L HFNC on the wall. Given current status, do not recommend post acute OT f/u. Will continue to follow as available and appropriate.     Follow Up Recommendations  No OT follow up    Equipment Recommendations  None recommended by OT    Recommendations for Other Services       Precautions / Restrictions Precautions Precautions: Fall;Other (comment) Precaution Comments: Desats with activity Restrictions Weight Bearing Restrictions: No      Mobility Bed Mobility               General bed mobility comments: up in chair  Transfers Overall transfer level: Needs assistance Equipment used: None Transfers: Sit to/from Stand Sit to Stand: Modified independent (Device/Increase time)         General transfer comment: able to transfer from [facility] and recliner without assist    Balance Overall balance assessment: No apparent balance deficits (not formally assessed)                                         ADL either performed or  assessed with clinical judgement   ADL                                         General ADL Comments: pt overall mod I With all BADL mobility wise, but having marked deficits in activity tolerance for completion. Pt de sats easily while performing BADL and requires supervision for O2 and line management.     Vision Patient Visual Report: No change from baseline       Perception     Praxis      Pertinent Vitals/Pain Pain Assessment: No/denies pain     Hand Dominance     Extremity/Trunk Assessment Upper Extremity Assessment Upper Extremity Assessment: Overall WFL for tasks assessed   Lower Extremity Assessment Lower Extremity Assessment: Defer to PT evaluation       Communication Communication Communication: No difficulties   Cognition Arousal/Alertness: Awake/alert Behavior During Therapy: WFL for tasks assessed/performed;Flat affect Overall Cognitive Status: Within Functional Limits for tasks assessed                                     General Comments       Exercises  Shoulder Instructions      Home Living Family/patient expects to be discharged to:: Private residence Living Arrangements: Spouse/significant other Available Help at Discharge: Family;Available 24 hours/day Type of Home: House Home Access: Level entry     Home Layout: One level     Bathroom Shower/Tub: Teacher, early years/pre: Standard     Home Equipment: None          Prior Functioning/Environment Level of Independence: Independent        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        OT Problem List: Decreased strength;Decreased knowledge of use of DME or AE;Decreased activity tolerance;Cardiopulmonary status limiting activity      OT Treatment/Interventions: Self-care/ADL training;Therapeutic exercise;Patient/family education;Energy conservation;Therapeutic activities;DME and/or AE instruction    OT Goals(Current goals can be found  in the care plan section) Acute Rehab OT Goals Patient Stated Goal: Return home, better breathing OT Goal Formulation: With patient Time For Goal Achievement: 07/07/19 Potential to Achieve Goals: Good  OT Frequency: Min 3X/week   Barriers to D/C:            Co-evaluation              AM-PAC OT "6 Clicks" Daily Activity     Outcome Measure Help from another person eating meals?: None Help from another person taking care of personal grooming?: A Little Help from another person toileting, which includes using toliet, bedpan, or urinal?: A Little Help from another person bathing (including washing, rinsing, drying)?: A Little Help from another person to put on and taking off regular upper body clothing?: None Help from another person to put on and taking off regular lower body clothing?: A Little 6 Click Score: 20   End of Session Equipment Utilized During Treatment: Oxygen Nurse Communication: Mobility status  Activity Tolerance: Patient tolerated treatment well Patient left: in chair;with call bell/phone within reach  OT Visit Diagnosis: Unsteadiness on feet (R26.81);Other abnormalities of gait and mobility (R26.89);Other (comment)(cardiopulmonary status limiting BADL tolerance)                Time: 1029-1101 OT Time Calculation (min): 32 min Charges:  OT General Charges $OT Visit: 1 Visit OT Evaluation $OT Eval Low Complexity: 1 Low OT Treatments $Self Care/Home Management : 8-22 mins  Zenovia Jarred, MSOT, OTR/L Behavioral Health OT/ Acute Relief OT [facility] Office: [phone]   Zenovia Jarred 06/23/2019, 4:35 PM

## 2019-06-24 DIAGNOSIS — R0602 Shortness of breath: Secondary | ICD-10-CM

## 2019-06-24 LAB — BRAIN NATRIURETIC PEPTIDE: B Natriuretic Peptide: 509.2 pg/mL — ABNORMAL HIGH (ref 0.0–100.0)

## 2019-06-24 LAB — COMPREHENSIVE METABOLIC PANEL
ALT: 23 U/L (ref 0–44)
AST: 24 U/L (ref 15–41)
Albumin: 3.1 g/dL — ABNORMAL LOW (ref 3.5–5.0)
Alkaline Phosphatase: 78 U/L (ref 38–126)
Anion gap: 10 (ref 5–15)
BUN: 31 mg/dL — ABNORMAL HIGH (ref 8–23)
CO2: 27 mmol/L (ref 22–32)
Calcium: 8.7 mg/dL — ABNORMAL LOW (ref 8.9–10.3)
Chloride: 101 mmol/L (ref 98–111)
Creatinine, Ser: 0.98 mg/dL (ref 0.44–1.00)
GFR calc Af Amer: >60 mL/min (ref >60)
GFR calc non Af Amer: >60 mL/min (ref >60)
Glucose, Bld: 147 mg/dL — ABNORMAL HIGH (ref 70–99)
Potassium: 4.6 mmol/L (ref 3.5–5.1)
Sodium: 138 mmol/L (ref 135–145)
Total Bilirubin: 0.8 mg/dL (ref 0.3–1.2)
Total Protein: 6.9 g/dL (ref 6.5–8.1)

## 2019-06-24 LAB — CBC WITH DIFFERENTIAL/PLATELET
Abs Immature Granulocytes: 0.63 10*3/uL — ABNORMAL HIGH (ref 0.00–0.07)
Basophils Absolute: 0.1 10*3/uL (ref 0.0–0.1)
Basophils Relative: 0 %
Eosinophils Absolute: 0 10*3/uL (ref 0.0–0.5)
Eosinophils Relative: 0 %
HCT: 39.8 % (ref 36.0–46.0)
Hemoglobin: 12.7 g/dL (ref 12.0–15.0)
Immature Granulocytes: 4 %
Lymphocytes Relative: 8 %
Lymphs Abs: 1.2 10*3/uL (ref 0.7–4.0)
MCH: 26.1 pg (ref 26.0–34.0)
MCHC: 31.9 g/dL (ref 30.0–36.0)
MCV: 81.7 fL (ref 80.0–100.0)
Monocytes Absolute: 0.7 10*3/uL (ref 0.1–1.0)
Monocytes Relative: 5 %
Neutro Abs: 11.8 10*3/uL — ABNORMAL HIGH (ref 1.7–7.7)
Neutrophils Relative %: 83 %
Platelets: 301 10*3/uL (ref 150–400)
RBC: 4.87 MIL/uL (ref 3.87–5.11)
RDW: 14.4 % (ref 11.5–15.5)
WBC: 14.3 10*3/uL — ABNORMAL HIGH (ref 4.0–10.5)
nRBC: 0.2 % (ref 0.0–0.2)

## 2019-06-24 LAB — D-DIMER, QUANTITATIVE: D-Dimer, Quant: 1.82 ug/mL-FEU — ABNORMAL HIGH (ref 0.00–0.50)

## 2019-06-24 LAB — C-REACTIVE PROTEIN: CRP: 8.7 mg/dL — ABNORMAL HIGH (ref <1.0)

## 2019-06-24 LAB — GLUCOSE, CAPILLARY
Glucose-Capillary: 132 mg/dL — ABNORMAL HIGH (ref 70–99)
Glucose-Capillary: 157 mg/dL — ABNORMAL HIGH (ref 70–99)
Glucose-Capillary: 110 mg/dL — ABNORMAL HIGH (ref 70–99)
Glucose-Capillary: 108 mg/dL — ABNORMAL HIGH (ref 70–99)

## 2019-06-24 LAB — MAGNESIUM: Magnesium: 2.5 mg/dL — ABNORMAL HIGH (ref 1.7–2.4)

## 2019-06-24 MED ORDER — FUROSEMIDE 10 MG/ML IJ SOLN
60.0000 mg | Freq: Once | INTRAMUSCULAR | Status: AC
  Administered 2019-06-24: 60 mg via INTRAVENOUS
  Filled 2019-06-24: qty 6

## 2019-06-24 MED ORDER — TOCILIZUMAB 400 MG/20ML IV SOLN
800.0000 mg | Freq: Once | INTRAVENOUS | Status: AC
  Administered 2019-06-24: 800 mg via INTRAVENOUS
  Filled 2019-06-24: qty 40

## 2019-06-24 MED ORDER — METHYLPREDNISOLONE SODIUM SUCC 125 MG IJ SOLR
60.0000 mg | Freq: Two times a day (BID) | INTRAMUSCULAR | Status: DC
  Administered 2019-06-24 – 2019-06-26 (×5): 60 mg via INTRAVENOUS
  Filled 2019-06-24 (×5): qty 2

## 2019-06-24 NOTE — Progress Notes (Addendum)
PROGRESS NOTE                                                                                                                                                                                                             Patient Demographics:    Erika Schmidt, is a 63 y.o. female, DOB - May 20, 1956, [id number]  Outpatient Primary MD for the patient is Petra Kuba, MD    LOS - 2  Admit date - 06/22/2019    CC - SOB     Brief Narrative   Erika Schmidt  is a 63 y.o. female, with H/O HTN, Chronic diastolic CHF EF 55 to 64% on echocardiogram noted from outside records, morbid obesity, patient was doing fairly well until about 5 to 6 days ago she and her husband started feeling poorly after getting flu shot, she was diagnosed with COVID-19 infection at [facility] urgent care on the seventh of this month, she tried to stay at home and take care of herself however she started feeling more short of breath and feverish hence came to [facility] ER yesterday evening where she was diagnosed with COVID-19 pneumonia, acute hypoxic respiratory failure, was placed on 4 L nasal cannula oxygen and transferred to [facility]     Patient was seen on 06/23/2019, she was pretty much the same as she was on the day of admission, CRP was still stable hence no further intervention was made.  Progress note from 06/23/2019 looks like was missed in error.   Subjective:    Erika Schmidt today has, No headache, No chest pain, No abdominal pain - No Nausea, No new weakness tingling or numbness, ++ Cough & ++ SOB.     Assessment  & Plan :     1. Acute Hypoxic Resp. Failure due to Acute Covid 19 Viral Pneumonitis during the ongoing 2020 Covid 19 Pandemic - she has been given IV steroids, remdesivir and convalescent plasma on the day of admission which was 06/22/2019.  She continues to be more short of breath, CRP was initially stable but now is rising.   At present time she is on 15 L high flow nasal cannula after moving from bed to chair.  Will give Actemra right away on 06/24/2019.  Encouraged her to sit up in chair in the daytime use I-S and flutter valve for pulmonary toiletry  and prone at night.   Actemra off label use - patient was told that if COVID-19 pneumonitis gets worse we might potentially use Actemra off label, she denies any known history of tuberculosis or hepatitis, understands the risks and benefits and wants to proceed with Actemra treatment if required.  Note her husband was also informed about this on the day of admission and he has also consented.  Understands the risks and benefits.  COVID-19 Labs  Recent Labs    06/21/19 1532 06/22/19 1250 06/23/19 0420 06/24/19 0440  DDIMER  --  6.57* 1.37* 1.82*  FERRITIN 182  --   --   --   LDH 249*  --   --   --   CRP 1.8* 1.8* 1.4* 8.7*    She was tested Covid positive at [facility] urgent care on 06/17/2019.  Paper copy was reviewed by me personally.  No results found for: SARSCOV2NAA  SpO2: 93 % O2 Flow Rate (L/min): 15 L/min  Hepatic Function Latest Ref Rng & Units 06/24/2019 06/23/2019 06/22/2019  Total Protein 6.5 - 8.1 g/dL 6.9 6.8 6.6  Albumin 3.5 - 5.0 g/dL 3.1(L) 3.2(L) 3.1(L)  AST 15 - 41 U/L _0 ALT 0 - 44 U/L 23 33 17  Alk Phosphatase 38 - 126 U/L 78 72 66  Total Bilirubin 0.3 - 1.2 mg/dL 0.8 0.7 1.0        Component Value Date/Time   BNP 509.2 (H) 06/24/2019 0440     2.  Mild acute on chronic diastolic CHF last EF 55 to 60% in outside records.    Few Rales on exam repeat IV Lasix on 06/24/2019 and continue home dose beta-blocker.  3.  Hypertension.  For now continue beta-blocker and monitor.  4.  Morbid obesity.  Follow with PCP for weight loss.  5.  Mild rise in high-sensitivity troponin at [facility] ER.  Trend stable and not an ACS pattern, likely small troponin leak due to demand mismatch from hypoxia and tachycardia.   Continue aspirin beta-blocker and add statin for secondary prevention.  No chest pain and EKG was nonacute.    Condition - Extremely Guarded  Family Communication  :  Husband on 06/22/2019 and 06/24/2019.  Code Status :  Full  Diet :   Diet Order            Diet Heart Room service appropriate? Yes; Fluid consistency: Thin  Diet effective now               Disposition Plan  :  Inpt  Consults  :  None  Procedures  :   Midline placed on 06/22/2019.  PUD Prophylaxis : Pepcid  DVT Prophylaxis  :  Lovenox    Lab Results  Component Value Date   PLT 301 06/24/2019    Inpatient Medications  Scheduled Meds: . sodium chloride   Intravenous Once  . albuterol  2 puff Inhalation Q6H  . aspirin  81 mg Oral Daily  . atorvastatin  40 mg Oral q1800  . Chlorhexidine Gluconate Cloth  6 each Topical Daily  . enoxaparin (LOVENOX) injection  55 mg Subcutaneous Q24H  . famotidine  20 mg Oral Daily  . furosemide  60 mg Intravenous Once  . insulin aspart  0-5 Units Subcutaneous QHS  . insulin aspart  0-9 Units Subcutaneous TID WC  . mouth rinse  15 mL Mouth Rinse BID  . methylPREDNISolone (SOLU-MEDROL) injection  60 mg Intravenous Q12H  . metoprolol succinate  25 mg Oral Daily  . sodium chloride flush  10-40 mL Intracatheter Q12H   Continuous Infusions: . remdesivir 100 mg in NS 250 mL Stopped (06/23/19 1113)   PRN Meds:.acetaminophen, chlorpheniramine-HYDROcodone, guaiFENesin-dextromethorphan, [DISCONTINUED] ondansetron **OR** ondansetron (ZOFRAN) IV, polyethylene glycol  Antibiotics  :    Anti-infectives (From admission, onward)   Start     Dose/Rate Route Frequency Ordered Stop   06/22/19 1300  remdesivir 100 mg in sodium chloride 0.9 % 250 mL IVPB     100 mg 500 mL/hr over 30 Minutes Intravenous Daily 06/22/19 1214 06/26/19 0959       Time Spent in minutes  30   Lala Lund M.D on 06/24/2019 at 9:07 AM  To page go to [email or web address] - password [facility]  [facility] -  Office  [phone]    See all Orders from today for further details    Objective:   Vitals:   06/24/19 0053 06/24/19 0430 06/24/19 0437 06/24/19 0718  BP: 125/66 135/83  (!) 142/81  Pulse: 85 91 87 94  Resp: 19 (!) 32 (!) 24 20  Temp: 98.4 F (36.9 C) 98.1 F (36.7 C)  98.4 F (36.9 C)  TempSrc: Oral Oral  Oral  SpO2: 100% 95% 97% 93%  Weight:      Height:        Wt Readings from Last 3 Encounters:  06/22/19 110 kg  06/21/19 109.8 kg  03/25/15 112.9 kg     Intake/Output Summary (Last 24 hours) at 06/24/2019 0907 Last data filed at 06/24/2019 0800 Gross per 24 hour  Intake 1150 ml  Output 1650 ml  Net -500 ml     Physical Exam  Awake Alert,  No new F.N deficits, Normal affect [place].AT,PERRAL Supple Neck,No JVD, No cervical lymphadenopathy appriciated.  Symmetrical Chest wall movement, Good air movement bilaterally, few rales RRR,No Gallops,Rubs or new Murmurs, No Parasternal Heave +ve B.Sounds, Abd Soft, No tenderness, No organomegaly appriciated, No rebound - guarding or rigidity. No Cyanosis, Clubbing or edema, No new Rash or bruise     Data Review:    CBC Recent Labs  Lab 06/21/19 1532 06/22/19 1250 06/23/19 0420 06/24/19 0440  WBC 8.6 11.5* 16.2* 14.3*  HGB 13.0 15.0 12.9 12.7  HCT 40.2 46.6* 41.0 39.8  PLT 247 221 286 301  MCV 78.7* 81.6 82.0 81.7  MCH 25.4* 26.3 25.8* 26.1  MCHC 32.3 32.2 31.5 31.9  RDW 14.5 14.7 14.6 14.4  LYMPHSABS 1.4  --  1.1 1.2  MONOABS 0.8  --  0.8 0.7  EOSABS 0.1  --  0.0 0.0  BASOSABS 0.0  --  0.0 0.1    Chemistries  Recent Labs  Lab 06/21/19 1532 06/22/19 1250 06/23/19 0420 06/24/19 0440  NA 131* 136 137 138  K 3.8 4.7 4.6 4.6  CL 99 104 101 101  CO2 22 20* 25 27  GLUCOSE 115* 155* 151* 147*  BUN 22 19 28* 31*  CREATININE 0.89 0.85 1.06* 0.98  CALCIUM 8.3* 8.4* 8.5* 8.7*  MG  --   --  2.3 2.5*  AST _0 ALT 16 17 33 23  ALKPHOS 63 66 72 78  BILITOT 0.7 1.0 0.7 0.8    ------------------------------------------------------------------------------------------------------------------ Recent Labs    06/21/19 1532  TRIG 277*    Lab Results  Component Value Date   HGBA1C 6.1 (H) 06/22/2019   ------------------------------------------------------------------------------------------------------------------ No results for input(s): TSH, T4TOTAL, T3FREE, THYROIDAB in the last 72 hours.  Invalid input(s): FREET3  Cardiac Enzymes No results for input(s): CKMB, TROPONINI, MYOGLOBIN in the last 168 hours.  Invalid input(s): CK ------------------------------------------------------------------------------------------------------------------    Component Value Date/Time   BNP 509.2 (H) 06/24/2019 0440    Micro Results Recent Results (from the past 240 hour(s))  Blood Culture (routine x 2)     Status: None (Preliminary result)   Collection Time: 06/21/19  3:37 PM   Specimen: BLOOD  Result Value Ref Range Status   Specimen Description BLOOD RIGHT ANTECUBITAL  Final   Special Requests   Final    BOTTLES DRAWN AEROBIC AND ANAEROBIC Blood Culture results may not be optimal due to an excessive volume of blood received in culture bottles   Culture   Final    NO GROWTH 3 DAYS Performed at [facility], [address], [place], [place] [address]    Report Status PENDING  Incomplete  Blood Culture (routine x 2)     Status: None (Preliminary result)   Collection Time: 06/21/19  3:38 PM   Specimen: BLOOD  Result Value Ref Range Status   Specimen Description BLOOD BLOOD RIGHT HAND  Final   Special Requests   Final    BOTTLES DRAWN AEROBIC AND ANAEROBIC Blood Culture adequate volume   Culture   Final    NO GROWTH 3 DAYS Performed at [facility], [address] [place], [place] [address]    Report Status PENDING  Incomplete    Radiology Reports Dg Chest Port 1 View  Result Date: 06/22/2019 CLINICAL DATA:  Shortness of breath,  history CHF, hypertension, COVID-19 EXAM: PORTABLE CHEST 1 VIEW COMPARISON:  Portable exam 1506 hours compared to 06/21/2019 FINDINGS: Markedly lordotic positioning. Normal heart size. Patchy BILATERAL airspace infiltrates consistent with pneumonia. No gross pleural effusion or pneumothorax. IMPRESSION: Patchy BILATERAL pulmonary infiltrates consistent with pneumonia. Electronically Signed   By: Lavonia Dana M.D.   On: 06/22/2019 15:20   Dg Chest Port 1 View  Result Date: 06/21/2019 CLINICAL DATA:  COVID-19 positive, weakness, cramping of the legs, increasing shortness of breath, worsening weakness, history CHF, hypertension EXAM: PORTABLE CHEST 1 VIEW COMPARISON:  Portable exam 1552 hours compared to 08/11/2011 FINDINGS: Upper normal heart size. Mediastinal contours normal. Patchy airspace infiltrates bilaterally consistent with pneumonia. No pleural effusion or pneumothorax. Osseous structures unremarkable. IMPRESSION: Patchy BILATERAL pulmonary infiltrates consistent with pneumonia. Electronically Signed   By: Lavonia Dana M.D.   On: 06/21/2019 16:02   [place] Ekg Site Rite  Result Date: 06/22/2019 If Site Rite image not attached, placement could not be confirmed due to current cardiac rhythm.

## 2019-06-24 NOTE — Progress Notes (Signed)
O2 increased from 5L [place] at rest to 15L HFNC with activity.  Patient assisted to chair and with morning hygiene routine.  Sats in mid 33s.  Will continue to titrate as able.

## 2019-06-25 LAB — CBC WITH DIFFERENTIAL/PLATELET
Abs Immature Granulocytes: 0.37 10*3/uL — ABNORMAL HIGH (ref 0.00–0.07)
Basophils Absolute: 0 10*3/uL (ref 0.0–0.1)
Basophils Relative: 1 %
Eosinophils Absolute: 0 10*3/uL (ref 0.0–0.5)
Eosinophils Relative: 0 %
HCT: 41 % (ref 36.0–46.0)
Hemoglobin: 12.9 g/dL (ref 12.0–15.0)
Immature Granulocytes: 7 %
Lymphocytes Relative: 17 %
Lymphs Abs: 0.9 10*3/uL (ref 0.7–4.0)
MCH: 25.5 pg — ABNORMAL LOW (ref 26.0–34.0)
MCHC: 31.5 g/dL (ref 30.0–36.0)
MCV: 81 fL (ref 80.0–100.0)
Monocytes Absolute: 0.3 10*3/uL (ref 0.1–1.0)
Monocytes Relative: 6 %
Neutro Abs: 3.8 10*3/uL (ref 1.7–7.7)
Neutrophils Relative %: 69 %
Platelets: 327 10*3/uL (ref 150–400)
RBC: 5.06 MIL/uL (ref 3.87–5.11)
RDW: 14.3 % (ref 11.5–15.5)
WBC: 5.4 10*3/uL (ref 4.0–10.5)
nRBC: 1.1 % — ABNORMAL HIGH (ref 0.0–0.2)

## 2019-06-25 LAB — BRAIN NATRIURETIC PEPTIDE: B Natriuretic Peptide: 275.4 pg/mL — ABNORMAL HIGH (ref 0.0–100.0)

## 2019-06-25 LAB — COMPREHENSIVE METABOLIC PANEL
ALT: 21 U/L (ref 0–44)
AST: 21 U/L (ref 15–41)
Albumin: 3.1 g/dL — ABNORMAL LOW (ref 3.5–5.0)
Alkaline Phosphatase: 82 U/L (ref 38–126)
Anion gap: 9 (ref 5–15)
BUN: 29 mg/dL — ABNORMAL HIGH (ref 8–23)
CO2: 28 mmol/L (ref 22–32)
Calcium: 8.7 mg/dL — ABNORMAL LOW (ref 8.9–10.3)
Chloride: 101 mmol/L (ref 98–111)
Creatinine, Ser: 0.9 mg/dL (ref 0.44–1.00)
GFR calc Af Amer: >60 mL/min (ref >60)
GFR calc non Af Amer: >60 mL/min (ref >60)
Glucose, Bld: 142 mg/dL — ABNORMAL HIGH (ref 70–99)
Potassium: 4.6 mmol/L (ref 3.5–5.1)
Sodium: 138 mmol/L (ref 135–145)
Total Bilirubin: 0.8 mg/dL (ref 0.3–1.2)
Total Protein: 6.9 g/dL (ref 6.5–8.1)

## 2019-06-25 LAB — URINE CULTURE: Culture: >=100000 — AB

## 2019-06-25 LAB — GLUCOSE, CAPILLARY
Glucose-Capillary: 135 mg/dL — ABNORMAL HIGH (ref 70–99)
Glucose-Capillary: 159 mg/dL — ABNORMAL HIGH (ref 70–99)

## 2019-06-25 LAB — MAGNESIUM: Magnesium: 2.5 mg/dL — ABNORMAL HIGH (ref 1.7–2.4)

## 2019-06-25 LAB — C-REACTIVE PROTEIN: CRP: 4.6 mg/dL — ABNORMAL HIGH (ref <1.0)

## 2019-06-25 LAB — D-DIMER, QUANTITATIVE: D-Dimer, Quant: 3.87 ug/mL-FEU — ABNORMAL HIGH (ref 0.00–0.50)

## 2019-06-25 MED ORDER — SALINE SPRAY 0.65 % NA SOLN
1.0000 | NASAL | Status: DC | PRN
  Filled 2019-06-25: qty 44

## 2019-06-25 MED ORDER — CEPHALEXIN 500 MG PO CAPS
500.0000 mg | ORAL_CAPSULE | Freq: Four times a day (QID) | ORAL | Status: DC
  Administered 2019-06-25 – 2019-06-27 (×9): 500 mg via ORAL
  Filled 2019-06-25 (×13): qty 1

## 2019-06-25 MED ORDER — FUROSEMIDE 10 MG/ML IJ SOLN
60.0000 mg | Freq: Once | INTRAMUSCULAR | Status: AC
  Administered 2019-06-25: 60 mg via INTRAVENOUS
  Filled 2019-06-25: qty 6

## 2019-06-25 NOTE — Progress Notes (Signed)
Updated patient's spouse by telephone on patient condition and plan of care.  All questions welcomed and answered.

## 2019-06-25 NOTE — Progress Notes (Signed)
Assisted pt OOB to chair and with morning hygiene routine.  O2 was increased from 4L [place] to 15L HFNC for activity.  Sats dropped to the low 80s, but much shorter recovery period compared to yesterday.  Pt is now at 12L HFNC saturating 90%.  Will continue to titrate as able.

## 2019-06-25 NOTE — Progress Notes (Addendum)
PROGRESS NOTE                                                                                                                                                                                                             Patient Demographics:    Erika Schmidt, is a 63 y.o. female, DOB - 1956-05-05, BEM:[id number]  Outpatient Primary MD for the patient is Petra Kuba, MD    LOS - 3  Admit date - 06/22/2019    CC - SOB     Brief Narrative   Erika Schmidt  is a 62 y.o. female, with H/O HTN, Chronic diastolic CHF EF 55 to 07% on echocardiogram noted from outside records, morbid obesity, patient was doing fairly well until about 5 to 6 days ago she and her husband started feeling poorly after getting flu shot, she was diagnosed with COVID-19 infection at [facility] urgent care on the seventh of this month, she tried to stay at home and take care of herself however she started feeling more short of breath and feverish hence came to [facility] ER yesterday evening where she was diagnosed with COVID-19 pneumonia, acute hypoxic respiratory failure, was placed on 4 L nasal cannula oxygen and transferred to [facility].     Patient was seen on 06/23/2019, she was pretty much the same as she was on the day of admission, CRP was still stable hence no further intervention was made.  Progress note from 06/23/2019 looks like was missed in error.   Subjective:   Patient in bed, appears comfortable, denies any headache, no fever, no chest pain or pressure, improved shortness of breath , no abdominal pain. No focal weakness.   Assessment  & Plan :     1. Acute Hypoxic Resp. Failure due to Acute Covid 19 Viral Pneumonitis during the ongoing 2020 Covid 19 Pandemic - she has been given IV steroids, remdesivir and convalescent plasma on the day of admission which was 06/22/2019.  Continue to get serially more hypoxic requiring 15 L high flow nasal  cannula oxygen even at rest, was given Actemra on 06/24/2019 after consent.  On 06/25/2019 she states she feels a whole lot better although still gets short winded upon moving, but overall states she feels a whole lot better.  We will continue to monitor closely we will also repeat Lasix on 06/25/2019  again encouraged her for pulmonary toiletry.Marland Kitchen  SpO2: 90 % O2 Flow Rate (L/min): 12 L/min(with activity)   COVID-19 Labs  Recent Labs    06/23/19 0420 06/24/19 0440 06/25/19 0500  DDIMER 1.37* 1.82* 3.87*  CRP 1.4* 8.7* 4.6*    She was tested Covid positive at [facility] urgent care on 06/17/2019.  Paper copy was reviewed by me personally.    Hepatic Function Latest Ref Rng & Units 06/25/2019 06/24/2019 06/23/2019  Total Protein 6.5 - 8.1 g/dL 6.9 6.9 6.8  Albumin 3.5 - 5.0 g/dL 3.1(L) 3.1(L) 3.2(L)  AST 15 - 41 U/L _0 ALT 0 - 44 U/L 21 23 33  Alk Phosphatase 38 - 126 U/L 82 78 72  Total Bilirubin 0.3 - 1.2 mg/dL 0.8 0.8 0.7        Component Value Date/Time   BNP 275.4 (H) 06/25/2019 0500     2.  Mild acute on chronic diastolic CHF last EF 55 to 60% in outside records.    Minimal Rales good response to IV Lasix which will be continued on 06/25/2019 and continue home dose beta-blocker.  3.  Hypertension.  For now continue beta-blocker and monitor.  4.  Morbid obesity.  Follow with PCP for weight loss.  5.  Mild rise in high-sensitivity troponin at [facility] ER.  Trend stable and not an ACS pattern, likely small troponin leak due to demand mismatch from hypoxia and tachycardia.  Continue aspirin beta-blocker and add statin for secondary prevention.  No chest pain and EKG was nonacute.    Condition - Extremely Guarded  Family Communication  :  Husband on 06/22/2019 and 06/24/2019. 06/25/19  Code Status :  Full  Diet :   Diet Order            Diet Heart Room service appropriate? Yes; Fluid consistency: Thin  Diet effective now                Disposition Plan  :  Inpt  Consults  :  None  Procedures  :   Midline placed on 06/22/2019.  PUD Prophylaxis : Pepcid  DVT Prophylaxis  :  Lovenox    Lab Results  Component Value Date   PLT 327 06/25/2019    Inpatient Medications  Scheduled Meds: . sodium chloride   Intravenous Once  . albuterol  2 puff Inhalation Q6H  . aspirin  81 mg Oral Daily  . atorvastatin  40 mg Oral q1800  . Chlorhexidine Gluconate Cloth  6 each Topical Daily  . enoxaparin (LOVENOX) injection  55 mg Subcutaneous Q24H  . famotidine  20 mg Oral Daily  . furosemide  60 mg Intravenous Once  . insulin aspart  0-5 Units Subcutaneous QHS  . insulin aspart  0-9 Units Subcutaneous TID WC  . mouth rinse  15 mL Mouth Rinse BID  . methylPREDNISolone (SOLU-MEDROL) injection  60 mg Intravenous Q12H  . metoprolol succinate  25 mg Oral Daily  . sodium chloride flush  10-40 mL Intracatheter Q12H   Continuous Infusions: . remdesivir 100 mg in NS 250 mL 100 mg (06/24/19 0941)   PRN Meds:.acetaminophen, chlorpheniramine-HYDROcodone, guaiFENesin-dextromethorphan, [DISCONTINUED] ondansetron **OR** ondansetron (ZOFRAN) IV, polyethylene glycol, sodium chloride  Antibiotics  :    Anti-infectives (From admission, onward)   Start     Dose/Rate Route Frequency Ordered Stop   06/22/19 1300  remdesivir 100 mg in sodium chloride 0.9 % 250 mL IVPB     100 mg 500 mL/hr over 30  Minutes Intravenous Daily 06/22/19 1214 06/26/19 0959       Time Spent in minutes  30   Lala Lund M.D on 06/25/2019 at 10:18 AM  To page go to [email or web address] - password [facility]  [facility] -  Office  [phone]    See all Orders from today for further details    Objective:   Vitals:   06/25/19 0028 06/25/19 0506 06/25/19 0514 06/25/19 0745  BP: (!) 141/86 138/77  (!) 121/54  Pulse: 95 87 82 88  Resp: 16 (!) 31 (!) 22 15  Temp: 98.3 F (36.8 C) 98.3 F (36.8 C)  97.8 F (36.6 C)  TempSrc: Oral Oral  Oral  SpO2: 92% 93%  (!) 86% 90%  Weight:      Height:        Wt Readings from Last 3 Encounters:  06/22/19 110 kg  06/21/19 109.8 kg  03/25/15 112.9 kg     Intake/Output Summary (Last 24 hours) at 06/25/2019 1018 Last data filed at 06/25/2019 0600 Gross per 24 hour  Intake 350 ml  Output 2400 ml  Net -2050 ml     Physical Exam  Awake Alert,   No new F.N deficits, Normal affect [place].AT,PERRAL Supple Neck,No JVD, No cervical lymphadenopathy appriciated.  Symmetrical Chest wall movement, Good air movement bilaterally, few rales RRR,No Gallops, Rubs or new Murmurs, No Parasternal Heave +ve B.Sounds, Abd Soft, No tenderness, No organomegaly appriciated, No rebound - guarding or rigidity. No Cyanosis, Clubbing or edema, No new Rash or bruise    Data Review:    CBC Recent Labs  Lab 06/21/19 1532 06/22/19 1250 06/23/19 0420 06/24/19 0440 06/25/19 0500  WBC 8.6 11.5* 16.2* 14.3* 5.4  HGB 13.0 15.0 12.9 12.7 12.9  HCT 40.2 46.6* 41.0 39.8 41.0  PLT 247 221 286 301 327  MCV 78.7* 81.6 82.0 81.7 81.0  MCH 25.4* 26.3 25.8* 26.1 25.5*  MCHC 32.3 32.2 31.5 31.9 31.5  RDW 14.5 14.7 14.6 14.4 14.3  LYMPHSABS 1.4  --  1.1 1.2 0.9  MONOABS 0.8  --  0.8 0.7 0.3  EOSABS 0.1  --  0.0 0.0 0.0  BASOSABS 0.0  --  0.0 0.1 0.0    Chemistries  Recent Labs  Lab 06/21/19 1532 06/22/19 1250 06/23/19 0420 06/24/19 0440 06/25/19 0500  NA 131* 136 137 138 138  K 3.8 4.7 4.6 4.6 4.6  CL 99 104 101 101 101  CO2 22 20* _0 GLUCOSE 115* 155* 151* 147* 142*  BUN 22 19 28* 31* 29*  CREATININE 0.89 0.85 1.06* 0.98 0.90  CALCIUM 8.3* 8.4* 8.5* 8.7* 8.7*  MG  --   --  2.3 2.5* 2.5*  AST _1 ALT 16 17 33 23 21  ALKPHOS 63 66 72 78 82  BILITOT 0.7 1.0 0.7 0.8 0.8   ------------------------------------------------------------------------------------------------------------------ No results for input(s): CHOL, HDL, LDLCALC, TRIG, CHOLHDL, LDLDIRECT in the last 72 hours.  Lab Results   Component Value Date   HGBA1C 6.1 (H) 06/22/2019   ------------------------------------------------------------------------------------------------------------------ No results for input(s): TSH, T4TOTAL, T3FREE, THYROIDAB in the last 72 hours.  Invalid input(s): FREET3  Cardiac Enzymes No results for input(s): CKMB, TROPONINI, MYOGLOBIN in the last 168 hours.  Invalid input(s): CK ------------------------------------------------------------------------------------------------------------------    Component Value Date/Time   BNP 275.4 (H) 06/25/2019 0500    Micro Results Recent Results (from the past 240 hour(s))  Blood Culture (routine x 2)     Status: None (  Preliminary result)   Collection Time: 06/21/19  3:37 PM   Specimen: BLOOD  Result Value Ref Range Status   Specimen Description BLOOD RIGHT ANTECUBITAL  Final   Special Requests   Final    BOTTLES DRAWN AEROBIC AND ANAEROBIC Blood Culture results may not be optimal due to an excessive volume of blood received in culture bottles   Culture   Final    NO GROWTH 3 DAYS Performed at [facility], [address], [place], [place] [address]    Report Status PENDING  Incomplete  Blood Culture (routine x 2)     Status: None (Preliminary result)   Collection Time: 06/21/19  3:38 PM   Specimen: BLOOD  Result Value Ref Range Status   Specimen Description BLOOD BLOOD RIGHT HAND  Final   Special Requests   Final    BOTTLES DRAWN AEROBIC AND ANAEROBIC Blood Culture adequate volume   Culture   Final    NO GROWTH 3 DAYS Performed at [facility], [address] [place], [place] [address]    Report Status PENDING  Incomplete  Culture, Urine     Status: Abnormal   Collection Time: 06/23/19  4:12 PM   Specimen: Urine, Clean Catch  Result Value Ref Range Status   Specimen Description   Final    URINE, CLEAN CATCH Performed at [facility], [place] [address], [place], [place] [address]     Special Requests   Final    NONE Performed at [facility], [place] [address] [place], [place] [address]    Culture >=100,000 COLONIES/mL KLEBSIELLA PNEUMONIAE (A)  Final   Report Status 06/25/2019 FINAL  Final   Organism ID, Bacteria KLEBSIELLA PNEUMONIAE (A)  Final      Susceptibility   Klebsiella pneumoniae - MIC*    AMPICILLIN >=32 RESISTANT Resistant     CEFAZOLIN <=4 SENSITIVE Sensitive     CEFTRIAXONE <=1 SENSITIVE Sensitive     CIPROFLOXACIN <=0.25 SENSITIVE Sensitive     GENTAMICIN <=1 SENSITIVE Sensitive     IMIPENEM <=0.25 SENSITIVE Sensitive     NITROFURANTOIN 64 INTERMEDIATE Intermediate     TRIMETH/SULFA <=20 SENSITIVE Sensitive     AMPICILLIN/SULBACTAM 4 SENSITIVE Sensitive     PIP/TAZO <=4 SENSITIVE Sensitive     Extended ESBL NEGATIVE Sensitive     * >=100,000 COLONIES/mL KLEBSIELLA PNEUMONIAE    Radiology Reports Dg Chest Port 1 View  Result Date: 06/22/2019 CLINICAL DATA:  Shortness of breath, history CHF, hypertension, COVID-19 EXAM: PORTABLE CHEST 1 VIEW COMPARISON:  Portable exam 1506 hours compared to 06/21/2019 FINDINGS: Markedly lordotic positioning. Normal heart size. Patchy BILATERAL airspace infiltrates consistent with pneumonia. No gross pleural effusion or pneumothorax. IMPRESSION: Patchy BILATERAL pulmonary infiltrates consistent with pneumonia. Electronically Signed   By: Lavonia Dana M.D.   On: 06/22/2019 15:20   Dg Chest Port 1 View  Result Date: 06/21/2019 CLINICAL DATA:  COVID-19 positive, weakness, cramping of the legs, increasing shortness of breath, worsening weakness, history CHF, hypertension EXAM: PORTABLE CHEST 1 VIEW COMPARISON:  Portable exam 1552 hours compared to 08/11/2011 FINDINGS: Upper normal heart size. Mediastinal contours normal. Patchy airspace infiltrates bilaterally consistent with pneumonia. No pleural effusion or pneumothorax. Osseous structures unremarkable. IMPRESSION: Patchy BILATERAL pulmonary infiltrates  consistent with pneumonia. Electronically Signed   By: Lavonia Dana M.D.   On: 06/21/2019 16:02   [place] Ekg Site Rite  Result Date: 06/22/2019 If Site Rite image not attached, placement could not be confirmed due to  current cardiac rhythm.

## 2019-06-26 LAB — CULTURE, BLOOD (ROUTINE X 2)
Culture: NO GROWTH
Culture: NO GROWTH
Special Requests: ADEQUATE

## 2019-06-26 LAB — CBC WITH DIFFERENTIAL/PLATELET
Abs Immature Granulocytes: 0.31 10*3/uL — ABNORMAL HIGH (ref 0.00–0.07)
Basophils Absolute: 0 10*3/uL (ref 0.0–0.1)
Basophils Relative: 1 %
Eosinophils Absolute: 0 10*3/uL (ref 0.0–0.5)
Eosinophils Relative: 0 %
HCT: 41.2 % (ref 36.0–46.0)
Hemoglobin: 13.2 g/dL (ref 12.0–15.0)
Immature Granulocytes: 5 %
Lymphocytes Relative: 16 %
Lymphs Abs: 1 10*3/uL (ref 0.7–4.0)
MCH: 25.7 pg — ABNORMAL LOW (ref 26.0–34.0)
MCHC: 32 g/dL (ref 30.0–36.0)
MCV: 80.3 fL (ref 80.0–100.0)
Monocytes Absolute: 0.8 10*3/uL (ref 0.1–1.0)
Monocytes Relative: 13 %
Neutro Abs: 4.2 10*3/uL (ref 1.7–7.7)
Neutrophils Relative %: 65 %
Platelets: 347 10*3/uL (ref 150–400)
RBC: 5.13 MIL/uL — ABNORMAL HIGH (ref 3.87–5.11)
RDW: 14.1 % (ref 11.5–15.5)
WBC: 6.4 10*3/uL (ref 4.0–10.5)
nRBC: 0.9 % — ABNORMAL HIGH (ref 0.0–0.2)

## 2019-06-26 LAB — COMPREHENSIVE METABOLIC PANEL
ALT: 18 U/L (ref 0–44)
AST: 18 U/L (ref 15–41)
Albumin: 3.2 g/dL — ABNORMAL LOW (ref 3.5–5.0)
Alkaline Phosphatase: 71 U/L (ref 38–126)
Anion gap: 10 (ref 5–15)
BUN: 29 mg/dL — ABNORMAL HIGH (ref 8–23)
CO2: 27 mmol/L (ref 22–32)
Calcium: 8.6 mg/dL — ABNORMAL LOW (ref 8.9–10.3)
Chloride: 100 mmol/L (ref 98–111)
Creatinine, Ser: 0.86 mg/dL (ref 0.44–1.00)
GFR calc Af Amer: >60 mL/min (ref >60)
GFR calc non Af Amer: >60 mL/min (ref >60)
Glucose, Bld: 129 mg/dL — ABNORMAL HIGH (ref 70–99)
Potassium: 4.3 mmol/L (ref 3.5–5.1)
Sodium: 137 mmol/L (ref 135–145)
Total Bilirubin: 1.2 mg/dL (ref 0.3–1.2)
Total Protein: 7.1 g/dL (ref 6.5–8.1)

## 2019-06-26 LAB — GLUCOSE, CAPILLARY
Glucose-Capillary: 135 mg/dL — ABNORMAL HIGH (ref 70–99)
Glucose-Capillary: 117 mg/dL — ABNORMAL HIGH (ref 70–99)
Glucose-Capillary: 134 mg/dL — ABNORMAL HIGH (ref 70–99)
Glucose-Capillary: 122 mg/dL — ABNORMAL HIGH (ref 70–99)

## 2019-06-26 LAB — D-DIMER, QUANTITATIVE: D-Dimer, Quant: 3.45 ug/mL-FEU — ABNORMAL HIGH (ref 0.00–0.50)

## 2019-06-26 LAB — MAGNESIUM: Magnesium: 2.7 mg/dL — ABNORMAL HIGH (ref 1.7–2.4)

## 2019-06-26 LAB — C-REACTIVE PROTEIN: CRP: 1.5 mg/dL — ABNORMAL HIGH (ref <1.0)

## 2019-06-26 LAB — BRAIN NATRIURETIC PEPTIDE: B Natriuretic Peptide: 199.2 pg/mL — ABNORMAL HIGH (ref 0.0–100.0)

## 2019-06-26 MED ORDER — FUROSEMIDE 10 MG/ML IJ SOLN
40.0000 mg | Freq: Once | INTRAMUSCULAR | Status: AC
  Administered 2019-06-26: 40 mg via INTRAVENOUS
  Filled 2019-06-26: qty 4

## 2019-06-26 NOTE — Progress Notes (Signed)
Physical Therapy Treatment Patient Details Name: Erika Schmidt MRN: [id number] DOB: [birth year] Today's Date: 06/26/2019    History of Present Illness Pt is a 63 y.o. female admitted 06/22/19 with worsening SOB, dx with COVID-19. PMH includes HTN, CHF, obesity.    PT Comments    Pt progressing well with mobility and increasing activity tolerance. On 3L to maintain SpO2 with activity.    Follow Up Recommendations  No PT follow up;Supervision - Intermittent     Equipment Recommendations  None recommended by PT    Recommendations for Other Services       Precautions / Restrictions Precautions Precautions: Other (comment) Precaution Comments: watch SpO2 Restrictions Weight Bearing Restrictions: No    Mobility  Bed Mobility               General bed mobility comments: up in chair  Transfers Overall transfer level: Modified independent Equipment used: None Transfers: Sit to/from Stand Sit to Stand: Modified independent (Device/Increase time)            Ambulation/Gait Ambulation/Gait assistance: Supervision Gait Distance (Feet): 180 Feet Assistive device: None Gait Pattern/deviations: Step-through pattern;Decreased stride length;Wide base of support Gait velocity: Decreased Gait velocity interpretation: 1.31 - 2.62 ft/sec, indicative of limited community ambulator General Gait Details: Slow, steady gait with pt pushing portable O2. On RA SpO2 85%. On 3L SpO2 >92%.   Stairs             Wheelchair Mobility    Modified Rankin (Stroke Patients Only)       Balance Overall balance assessment: No apparent balance deficits (not formally assessed)                                          Cognition Arousal/Alertness: Awake/alert Behavior During Therapy: WFL for tasks assessed/performed;Flat affect Overall Cognitive Status: Within Functional Limits for tasks assessed                                         Exercises      General Comments        Pertinent Vitals/Pain Pain Assessment: No/denies pain    Home Living                      Prior Function            PT Goals (current goals can now be found in the care plan section) Acute Rehab PT Goals Patient Stated Goal: Return home, better breathing Progress towards PT goals: Progressing toward goals    Frequency    Min 3X/week      PT Plan Current plan remains appropriate    Co-evaluation              AM-PAC PT "6 Clicks" Mobility   Outcome Measure  Help needed turning from your back to your side while in a flat bed without using bedrails?: None Help needed moving from lying on your back to sitting on the side of a flat bed without using bedrails?: None Help needed moving to and from a bed to a chair (including a wheelchair)?: None Help needed standing up from a chair using your arms (e.g., wheelchair or bedside chair)?: None Help needed to walk in hospital room?: A Little Help needed climbing 3-5 steps with  a railing? : A Little 6 Click Score: 22    End of Session Equipment Utilized During Treatment: Oxygen Activity Tolerance: Patient tolerated treatment well Patient left: in chair;with call bell/phone within reach Nurse Communication: Mobility status PT Visit Diagnosis: Other abnormalities of gait and mobility (R26.89)     Time: [id number] PT Time Calculation (min) (ACUTE ONLY): 15 min  Charges:  $Gait Training: 8-22 mins                     [place] Pager [phone] Office [place] 06/26/2019, 5:07 PM

## 2019-06-26 NOTE — Progress Notes (Addendum)
PROGRESS NOTE                                                                                                                                                                                                             Patient Demographics:    Erika Schmidt, is a 63 y.o. female, DOB - 1955/11/15, [id number]  Outpatient Primary MD for the patient is Petra Kuba, MD    LOS - 4  Admit date - 06/22/2019    CC - SOB     Brief Narrative   Erika Schmidt  is a 63 y.o. female, with H/O HTN, Chronic diastolic CHF EF 55 to 38% on echocardiogram noted from outside records, morbid obesity, patient was doing fairly well until about 5 to 6 days ago she and her husband started feeling poorly after getting flu shot, she was diagnosed with COVID-19 infection at [facility] urgent care on the seventh of this month, she tried to stay at home and take care of herself however she started feeling more short of breath and feverish hence came to [facility] ER yesterday evening where she was diagnosed with COVID-19 pneumonia, acute hypoxic respiratory failure, was placed on 4 L nasal cannula oxygen and transferred to [facility].     Patient was seen on 06/23/2019, she was pretty much the same as she was on the day of admission, CRP was still stable hence no further intervention was made.  Progress note from 06/23/2019 looks like was missed in error.   Subjective:   Patient in bed, appears comfortable, denies any headache, no fever, no chest pain or pressure, no shortness of breath , no abdominal pain. No focal weakness.    Assessment  & Plan :     1. Acute Hypoxic Resp. Failure due to Acute Covid 19 Viral Pneumonitis during the ongoing 2020 Covid 19 Pandemic - she has been given IV steroids, remdesivir and convalescent plasma on the day of admission which was 06/22/2019.  Continue to get serially more hypoxic requiring 15 L high flow nasal  cannula oxygen even at rest, was given Actemra on 06/24/2019 after consent.  She has now shown remarkable improvement and now down to 3 L nasal cannula oxygen.  We will continue to monitor closely we will also repeat Lasix on 06/25/2019 again encouraged her for pulmonary toiletry.Marland Kitchen  SpO2: 97 %  O2 Flow Rate (L/min): 3 L/min   COVID-19 Labs  Recent Labs    06/24/19 0440 06/25/19 0500 06/26/19 0508  DDIMER 1.82* 3.87* 3.45*  CRP 8.7* 4.6* 1.5*    She was tested Covid positive at [facility] urgent care on 06/17/2019.  Paper copy was reviewed by me personally.    Hepatic Function Latest Ref Rng & Units 06/26/2019 06/25/2019 06/24/2019  Total Protein 6.5 - 8.1 g/dL 7.1 6.9 6.9  Albumin 3.5 - 5.0 g/dL 3.2(L) 3.1(L) 3.1(L)  AST 15 - 41 U/L _0 ALT 0 - 44 U/L _1 Alk Phosphatase 38 - 126 U/L 71 82 78  Total Bilirubin 0.3 - 1.2 mg/dL 1.2 0.8 0.8        Component Value Date/Time   BNP 199.2 (H) 06/26/2019 0508     2.  Mild acute on chronic diastolic CHF last EF 55 to 60% in outside records.    Minimal Rales good response to IV Lasix which will be continued on 06/25/2019 and continue home dose beta-blocker.  3.  Hypertension.  For now continue beta-blocker and monitor.  4.  Morbid obesity.  Follow with PCP for weight loss.  5.  Mild rise in high-sensitivity troponin at [facility] ER.  Trend stable and not an ACS pattern, likely small troponin leak due to demand mismatch from hypoxia and tachycardia.  Continue aspirin beta-blocker and add statin for secondary prevention.  No chest pain and EKG was nonacute.  6.  UTI.  On Keflex.    Condition - Extremely Guarded  Family Communication  :  Husband on 06/22/2019 and 06/24/2019. 06/25/19  Code Status :  Full  Diet :   Diet Order            Diet Heart Room service appropriate? Yes; Fluid consistency: Thin  Diet effective now               Disposition Plan  : Charge in the next 1 to 2 days when  she finishes her remdesivir course likely on home oxygen.  Consults  :  None  Procedures  :   Midline placed on 06/22/2019.  PUD Prophylaxis : Pepcid  DVT Prophylaxis  :  Lovenox    Lab Results  Component Value Date   PLT 347 06/26/2019    Inpatient Medications  Scheduled Meds: . sodium chloride   Intravenous Once  . albuterol  2 puff Inhalation Q6H  . aspirin  81 mg Oral Daily  . atorvastatin  40 mg Oral q1800  . cephALEXin  500 mg Oral QID  . Chlorhexidine Gluconate Cloth  6 each Topical Daily  . enoxaparin (LOVENOX) injection  55 mg Subcutaneous Q24H  . famotidine  20 mg Oral Daily  . insulin aspart  0-5 Units Subcutaneous QHS  . insulin aspart  0-9 Units Subcutaneous TID WC  . mouth rinse  15 mL Mouth Rinse BID  . methylPREDNISolone (SOLU-MEDROL) injection  60 mg Intravenous Q12H  . metoprolol succinate  25 mg Oral Daily  . sodium chloride flush  10-40 mL Intracatheter Q12H   Continuous Infusions:  PRN Meds:.acetaminophen, chlorpheniramine-HYDROcodone, guaiFENesin-dextromethorphan, [DISCONTINUED] ondansetron **OR** ondansetron (ZOFRAN) IV, polyethylene glycol, sodium chloride  Antibiotics  :    Anti-infectives (From admission, onward)   Start     Dose/Rate Route Frequency Ordered Stop   06/25/19 1400  cephALEXin (KEFLEX) capsule 500 mg     500 mg Oral 4 times daily 06/25/19 1258 06/30/19 1359   06/22/19  1300  remdesivir 100 mg in sodium chloride 0.9 % 250 mL IVPB     100 mg 500 mL/hr over 30 Minutes Intravenous Daily 06/22/19 1214 06/25/19 1120       Time Spent in minutes  30   Lala Lund M.D on 06/26/2019 at 9:40 AM  To page go to [email or web address] - password [email or web address]  [facility] -  Office  [phone]    See all Orders from today for further details    Objective:   Vitals:   06/25/19 1534 06/25/19 1700 06/25/19 1920 06/26/19 0726  BP: 133/80  (!) 141/77 138/79  Pulse: 80  70 68  Resp: _0 Temp: 97.9 F (36.6 C)  98 F (36.7 C)  97.9 F (36.6 C)  TempSrc: Oral  Oral Oral  SpO2: 97% 95% 91% 97%  Weight:      Height:        Wt Readings from Last 3 Encounters:  06/22/19 110 kg  06/21/19 109.8 kg  03/25/15 112.9 kg     Intake/Output Summary (Last 24 hours) at 06/26/2019 0940 Last data filed at 06/26/2019 0600 Gross per 24 hour  Intake 250 ml  Output 2300 ml  Net -2050 ml     Physical Exam  Awake Alert, Oriented X 3, No new F.N deficits, Normal affect [place].AT,PERRAL Supple Neck,No JVD, No cervical lymphadenopathy appriciated.  Symmetrical Chest wall movement, Good air movement bilaterally, CTAB RRR,No Gallops, Rubs or new Murmurs, No Parasternal Heave +ve B.Sounds, Abd Soft, No tenderness, No organomegaly appriciated, No rebound - guarding or rigidity. No Cyanosis, Clubbing or edema, No new Rash or bruise     Data Review:    CBC Recent Labs  Lab 06/21/19 1532 06/22/19 1250 06/23/19 0420 06/24/19 0440 06/25/19 0500 06/26/19 0508  WBC 8.6 11.5* 16.2* 14.3* 5.4 6.4  HGB 13.0 15.0 12.9 12.7 12.9 13.2  HCT 40.2 46.6* 41.0 39.8 41.0 41.2  PLT 247 221 286 301 327 347  MCV 78.7* 81.6 82.0 81.7 81.0 80.3  MCH 25.4* 26.3 25.8* 26.1 25.5* 25.7*  MCHC 32.3 32.2 31.5 31.9 31.5 32.0  RDW 14.5 14.7 14.6 14.4 14.3 14.1  LYMPHSABS 1.4  --  1.1 1.2 0.9 1.0  MONOABS 0.8  --  0.8 0.7 0.3 0.8  EOSABS 0.1  --  0.0 0.0 0.0 0.0  BASOSABS 0.0  --  0.0 0.1 0.0 0.0    Chemistries  Recent Labs  Lab 06/22/19 1250 06/23/19 0420 06/24/19 0440 06/25/19 0500 06/26/19 0508  NA 136 137 138 138 137  K 4.7 4.6 4.6 4.6 4.3  CL 104 101 101 101 100  CO2 20* _1 GLUCOSE 155* 151* 147* 142* 129*  BUN 19 28* 31* 29* 29*  CREATININE 0.85 1.06* 0.98 0.90 0.86  CALCIUM 8.4* 8.5* 8.7* 8.7* 8.6*  MG  --  2.3 2.5* 2.5* 2.7*  AST _2 ALT 17 33 _3 ALKPHOS 66 72 78 82 71  BILITOT 1.0 0.7 0.8 0.8 1.2    ------------------------------------------------------------------------------------------------------------------ No results for input(s): CHOL, HDL, LDLCALC, TRIG, CHOLHDL, LDLDIRECT in the last 72 hours.  Lab Results  Component Value Date   HGBA1C 6.1 (H) 06/22/2019   ------------------------------------------------------------------------------------------------------------------ No results for input(s): TSH, T4TOTAL, T3FREE, THYROIDAB in the last 72 hours.  Invalid input(s): FREET3  Cardiac Enzymes No results for input(s): CKMB, TROPONINI, MYOGLOBIN in the last 168 hours.  Invalid input(s): CK ------------------------------------------------------------------------------------------------------------------    Component Value  Date/Time   BNP 199.2 (H) 06/26/2019 1610    Micro Results Recent Results (from the past 240 hour(s))  Blood Culture (routine x 2)     Status: None   Collection Time: 06/21/19  3:37 PM   Specimen: BLOOD  Result Value Ref Range Status   Specimen Description BLOOD RIGHT ANTECUBITAL  Final   Special Requests   Final    BOTTLES DRAWN AEROBIC AND ANAEROBIC Blood Culture results may not be optimal due to an excessive volume of blood received in culture bottles   Culture   Final    NO GROWTH 5 DAYS Performed at [facility], [address], [place], [place] [address]    Report Status 06/26/2019 FINAL  Final  Blood Culture (routine x 2)     Status: None   Collection Time: 06/21/19  3:38 PM   Specimen: BLOOD  Result Value Ref Range Status   Specimen Description BLOOD BLOOD RIGHT HAND  Final   Special Requests   Final    BOTTLES DRAWN AEROBIC AND ANAEROBIC Blood Culture adequate volume   Culture   Final    NO GROWTH 5 DAYS Performed at [facility], [address], [place], [place] [address]    Report Status 06/26/2019 FINAL  Final  Culture, Urine     Status: Abnormal   Collection Time: 06/23/19  4:12 PM   Specimen: Urine, Clean  Catch  Result Value Ref Range Status   Specimen Description   Final    URINE, CLEAN CATCH Performed at [facility], [place] [address] [place], [place] [address]    Special Requests   Final    NONE Performed at [facility], [place] [address] [place], [place] [address]    Culture >=100,000 COLONIES/mL KLEBSIELLA PNEUMONIAE (A)  Final   Report Status 06/25/2019 FINAL  Final   Organism ID, Bacteria KLEBSIELLA PNEUMONIAE (A)  Final      Susceptibility   Klebsiella pneumoniae - MIC*    AMPICILLIN >=32 RESISTANT Resistant     CEFAZOLIN <=4 SENSITIVE Sensitive     CEFTRIAXONE <=1 SENSITIVE Sensitive     CIPROFLOXACIN <=0.25 SENSITIVE Sensitive     GENTAMICIN <=1 SENSITIVE Sensitive     IMIPENEM <=0.25 SENSITIVE Sensitive     NITROFURANTOIN 64 INTERMEDIATE Intermediate     TRIMETH/SULFA <=20 SENSITIVE Sensitive     AMPICILLIN/SULBACTAM 4 SENSITIVE Sensitive     PIP/TAZO <=4 SENSITIVE Sensitive     Extended ESBL NEGATIVE Sensitive     * >=100,000 COLONIES/mL KLEBSIELLA PNEUMONIAE    Radiology Reports Dg Chest Port 1 View  Result Date: 06/22/2019 CLINICAL DATA:  Shortness of breath, history CHF, hypertension, COVID-19 EXAM: PORTABLE CHEST 1 VIEW COMPARISON:  Portable exam 1506 hours compared to 06/21/2019 FINDINGS: Markedly lordotic positioning. Normal heart size. Patchy BILATERAL airspace infiltrates consistent with pneumonia. No gross pleural effusion or pneumothorax. IMPRESSION: Patchy BILATERAL pulmonary infiltrates consistent with pneumonia. Electronically Signed   By: Lavonia Dana M.D.   On: 06/22/2019 15:20   Dg Chest Port 1 View  Result Date: 06/21/2019 CLINICAL DATA:  COVID-19 positive, weakness, cramping of the legs, increasing shortness of breath, worsening weakness, history CHF, hypertension EXAM: PORTABLE CHEST 1 VIEW COMPARISON:  Portable exam 1552 hours compared to 08/11/2011 FINDINGS: Upper normal heart size. Mediastinal contours normal.  Patchy airspace infiltrates bilaterally consistent with pneumonia. No pleural effusion or pneumothorax. Osseous structures unremarkable. IMPRESSION: Patchy BILATERAL pulmonary infiltrates consistent with pneumonia. Electronically Signed   By: Elta Guadeloupe  Thornton Papas M.D.   On: 06/21/2019 16:02   [place] Ekg Site Rite  Result Date: 06/22/2019 If Site Rite image not attached, placement could not be confirmed due to current cardiac rhythm.

## 2019-06-26 NOTE — Progress Notes (Signed)
SATURATION QUALIFICATIONS: (This note is used to comply with regulatory documentation for home oxygen)  Patient Saturations on Room Air at Rest = 88%  Patient Saturations on Room Air while Ambulating = 85%  Patient Saturations on 3 Liters of oxygen while Ambulating = 93%  Please briefly explain why patient needs home oxygen: Unable to maintain adequate oxygenation without supplemental O2.  [place] Pager [phone] Office [phone]

## 2019-06-26 NOTE — TOC Progression Note (Signed)
Transition of Care [facility]) - Progression Note    Patient Details  Name: Erika Schmidt MRN: [id number] Date of Birth: 1955/10/26  Transition of Care [facility]) CM/SW Contact  Joaquin Courts, RN Phone Number: 06/26/2019, 2:21 PM  Clinical Narrative:    Patient set up with [employer] for home O2 needs. AC notified to deliver portable tank to bedside.  [employer] to deliver concentrator to patient's home.     Expected Discharge Plan: Home/Self Care Barriers to Discharge: Continued Medical Work up  Expected Discharge Plan and Services Expected Discharge Plan: Home/Self Care   Discharge Planning Services: CM Consult   Living arrangements for the past 2 months: Single Family Home                 DME Arranged: Oxygen DME Agency: [place] Date DME Agency Contacted: 06/26/19 Time DME Agency Contacted: [id number] Representative spoke with at DME Agency: [employer] [place]: NA [place] Agency: NA         Social Determinants of Health ([place]) Interventions    Readmission Risk Interventions No flowsheet data found.

## 2019-06-27 LAB — COMPREHENSIVE METABOLIC PANEL
ALT: 18 U/L (ref 0–44)
AST: 16 U/L (ref 15–41)
Albumin: 3.3 g/dL — ABNORMAL LOW (ref 3.5–5.0)
Alkaline Phosphatase: 69 U/L (ref 38–126)
Anion gap: 10 (ref 5–15)
BUN: 29 mg/dL — ABNORMAL HIGH (ref 8–23)
CO2: 25 mmol/L (ref 22–32)
Calcium: 8.8 mg/dL — ABNORMAL LOW (ref 8.9–10.3)
Chloride: 102 mmol/L (ref 98–111)
Creatinine, Ser: 0.84 mg/dL (ref 0.44–1.00)
GFR calc Af Amer: >60 mL/min (ref >60)
GFR calc non Af Amer: >60 mL/min (ref >60)
Glucose, Bld: 117 mg/dL — ABNORMAL HIGH (ref 70–99)
Potassium: 4.8 mmol/L (ref 3.5–5.1)
Sodium: 137 mmol/L (ref 135–145)
Total Bilirubin: 1.5 mg/dL — ABNORMAL HIGH (ref 0.3–1.2)
Total Protein: 6.9 g/dL (ref 6.5–8.1)

## 2019-06-27 LAB — CBC WITH DIFFERENTIAL/PLATELET
Abs Immature Granulocytes: 0.28 10*3/uL — ABNORMAL HIGH (ref 0.00–0.07)
Basophils Absolute: 0 10*3/uL (ref 0.0–0.1)
Basophils Relative: 1 %
Eosinophils Absolute: 0 10*3/uL (ref 0.0–0.5)
Eosinophils Relative: 0 %
HCT: 42.8 % (ref 36.0–46.0)
Hemoglobin: 13.8 g/dL (ref 12.0–15.0)
Immature Granulocytes: 3 %
Lymphocytes Relative: 16 %
Lymphs Abs: 1.4 10*3/uL (ref 0.7–4.0)
MCH: 25.9 pg — ABNORMAL LOW (ref 26.0–34.0)
MCHC: 32.2 g/dL (ref 30.0–36.0)
MCV: 80.3 fL (ref 80.0–100.0)
Monocytes Absolute: 1 10*3/uL (ref 0.1–1.0)
Monocytes Relative: 12 %
Neutro Abs: 5.9 10*3/uL (ref 1.7–7.7)
Neutrophils Relative %: 68 %
Platelets: 400 10*3/uL (ref 150–400)
RBC: 5.33 MIL/uL — ABNORMAL HIGH (ref 3.87–5.11)
RDW: 14.5 % (ref 11.5–15.5)
WBC: 8.5 10*3/uL (ref 4.0–10.5)
nRBC: 0.7 % — ABNORMAL HIGH (ref 0.0–0.2)

## 2019-06-27 LAB — BRAIN NATRIURETIC PEPTIDE: B Natriuretic Peptide: 120.4 pg/mL — ABNORMAL HIGH (ref 0.0–100.0)

## 2019-06-27 LAB — GLUCOSE, CAPILLARY
Glucose-Capillary: 145 mg/dL — ABNORMAL HIGH (ref 70–99)
Glucose-Capillary: 137 mg/dL — ABNORMAL HIGH (ref 70–99)
Glucose-Capillary: 132 mg/dL — ABNORMAL HIGH (ref 70–99)
Glucose-Capillary: 145 mg/dL — ABNORMAL HIGH (ref 70–99)

## 2019-06-27 LAB — D-DIMER, QUANTITATIVE: D-Dimer, Quant: 2.11 ug/mL-FEU — ABNORMAL HIGH (ref 0.00–0.50)

## 2019-06-27 LAB — MAGNESIUM: Magnesium: 2.5 mg/dL — ABNORMAL HIGH (ref 1.7–2.4)

## 2019-06-27 LAB — C-REACTIVE PROTEIN: CRP: 0.8 mg/dL (ref <1.0)

## 2019-06-27 MED ORDER — METHYLPREDNISOLONE SODIUM SUCC 40 MG IJ SOLR
40.0000 mg | Freq: Every day | INTRAMUSCULAR | Status: DC
  Administered 2019-06-27: 40 mg via INTRAVENOUS
  Filled 2019-06-27: qty 1

## 2019-06-27 MED ORDER — FUROSEMIDE 10 MG/ML IJ SOLN
40.0000 mg | Freq: Once | INTRAMUSCULAR | Status: AC
  Administered 2019-06-27: 40 mg via INTRAVENOUS
  Filled 2019-06-27: qty 4

## 2019-06-27 MED ORDER — PREDNISONE 5 MG PO TABS: ORAL_TABLET | ORAL | 0 refills | Status: DC

## 2019-06-27 MED ORDER — ASPIRIN 81 MG PO TABS: 81.0000 mg | ORAL_TABLET | Freq: Every day | ORAL | Status: AC

## 2019-06-27 MED ORDER — ALBUTEROL SULFATE HFA 108 (90 BASE) MCG/ACT IN AERS: 2.0000 | INHALATION_SPRAY | Freq: Four times a day (QID) | RESPIRATORY_TRACT | 0 refills | Status: AC | PRN

## 2019-06-27 MED ORDER — APIXABAN 2.5 MG PO TABS: 2.5000 mg | ORAL_TABLET | Freq: Two times a day (BID) | ORAL | 0 refills | Status: DC

## 2019-06-27 NOTE — Discharge Instructions (Signed)
Follow with Primary MD Petra Kuba, MD in 7 days   Get CBC, CMP, 2 view Chest X ray -  checked next visit within 1 week by Primary MD    Activity: As tolerated with Full fall precautions use walker/cane & assistance as needed  Disposition Home   Diet: Heart Healthy   Special Instructions: If you have smoked or chewed Tobacco  in the last 2 yrs please stop smoking, stop any regular Alcohol  and or any Recreational drug use.  On your next visit with your primary care physician please Get Medicines reviewed and adjusted.  Please request your Prim.MD to go over all Hospital Tests and Procedure/Radiological results at the follow up, please get all Hospital records sent to your Prim MD by signing hospital release before you go home.  If you experience worsening of your admission symptoms, develop shortness of breath, life threatening emergency, suicidal or homicidal thoughts you must seek medical attention immediately by calling 911 or calling your MD immediately  if symptoms less severe.  You Must read complete instructions/literature along with all the possible adverse reactions/side effects for all the Medicines you take and that have been prescribed to you. Take any new Medicines after you have completely understood and accpet all the possible adverse reactions/side effects.                                                                           [place]                            [place], [place] [address]      Yoceline Linz was admitted to the Hospital on 06/22/2019 and Discharged  06/27/2019 and should be excused from work/school   for 21  days starting from date -  06/22/2019 , may return to work/school without any restrictions.  Call Lala Lund MD, [facility]  [phone] with questions.  Lala Lund M.D on 06/27/2019,at 9:02 AM  [facility]   Office  [phone]        Person Under Monitoring Name: HELMI LASCALA  Location: [address] [place] [address]   Infection Prevention Recommendations for Individuals Confirmed to have, or Being Evaluated for, 2019 Novel Coronavirus (COVID-19) Infection Who Receive Care at Home  Individuals who are confirmed to have, or are being evaluated for, COVID-19 should follow the prevention steps below until a healthcare provider or local or state health department says they can return to normal activities.  Stay home except to get medical care You should restrict activities outside your home, except for getting medical care. Do not go to work, school, or public areas, and do not use public transportation or taxis.  Call ahead before visiting your doctor Before your medical appointment, call the healthcare provider and tell them that you have, or are being evaluated for, COVID-19 infection. This will help the healthcare providers office take steps to keep other people from getting infected. Ask your healthcare provider to  call the local or state health department.  Monitor your symptoms Seek prompt medical attention if your illness is worsening (e.g., difficulty breathing). Before going to your medical appointment, call the healthcare provider and tell them that you have, or are being evaluated for, COVID-19 infection. Ask your healthcare provider to call the local or state health department.  Wear a facemask You should wear a facemask that covers your nose and mouth when you are in the same room with other people and when you visit a healthcare provider. People who live with or visit you should also wear a facemask while they are in the same room with you.  Separate yourself from other people in your home As much as possible, you should stay in a different room from other people in your home. Also, you should use a separate bathroom, if available.  Avoid sharing household items You should not  share dishes, drinking glasses, cups, eating utensils, towels, bedding, or other items with other people in your home. After using these items, you should wash them thoroughly with soap and water.  Cover your coughs and sneezes Cover your mouth and nose with a tissue when you cough or sneeze, or you can cough or sneeze into your sleeve. Throw used tissues in a lined trash can, and immediately wash your hands with soap and water for at least 20 seconds or use an alcohol-based hand rub.  Wash your [facility] your hands often and thoroughly with soap and water for at least 20 seconds. You can use an alcohol-based hand sanitizer if soap and water are not available and if your hands are not visibly dirty. Avoid touching your eyes, nose, and mouth with unwashed hands.   Prevention Steps for Caregivers and Household Members of Individuals Confirmed to have, or Being Evaluated for, COVID-19 Infection Being Cared for in the Home  If you live with, or provide care at home for, a person confirmed to have, or being evaluated for, COVID-19 infection please follow these guidelines to prevent infection:  Follow healthcare providers instructions Make sure that you understand and can help the patient follow any healthcare provider instructions for all care.  Provide for the patients basic needs You should help the patient with basic needs in the home and provide support for getting groceries, prescriptions, and other personal needs.  Monitor the patients symptoms If they are getting sicker, call his or her medical provider and tell them that the patient has, or is being evaluated for, COVID-19 infection. This will help the healthcare providers office take steps to keep other people from getting infected. Ask the healthcare provider to call the local or state health department.  Limit the number of people who have contact with the patient  If possible, have only one caregiver for the  patient.  Other household members should stay in another home or place of residence. If this is not possible, they should stay  in another room, or be separated from the patient as much as possible. Use a separate bathroom, if available.  Restrict visitors who do not have an essential need to be in the home.  Keep older adults, very young children, and other sick people away from the patient Keep older adults, very young children, and those who have compromised immune systems or chronic health conditions away from the patient. This includes people with chronic heart, lung, or kidney conditions, diabetes, and cancer.  Ensure good ventilation Make sure that shared spaces in the home have good  air flow, such as from an air conditioner or an opened window, weather permitting.  Wash your hands often  Wash your hands often and thoroughly with soap and water for at least 20 seconds. You can use an alcohol based hand sanitizer if soap and water are not available and if your hands are not visibly dirty.  Avoid touching your eyes, nose, and mouth with unwashed hands.  Use disposable paper towels to dry your hands. If not available, use dedicated cloth towels and replace them when they become wet.  Wear a facemask and gloves  Wear a disposable facemask at all times in the room and gloves when you touch or have contact with the patients blood, body fluids, and/or secretions or excretions, such as sweat, saliva, sputum, nasal mucus, vomit, urine, or feces.  Ensure the mask fits over your nose and mouth tightly, and do not touch it during use.  Throw out disposable facemasks and gloves after using them. Do not reuse.  Wash your hands immediately after removing your facemask and gloves.  If your personal clothing becomes contaminated, carefully remove clothing and launder. Wash your hands after handling contaminated clothing.  Place all used disposable facemasks, gloves, and other waste in a lined  container before disposing them with other household waste.  Remove gloves and wash your hands immediately after handling these items.  Do not share dishes, glasses, or other household items with the patient  Avoid sharing household items. You should not share dishes, drinking glasses, cups, eating utensils, towels, bedding, or other items with a patient who is confirmed to have, or being evaluated for, COVID-19 infection.  After the person uses these items, you should wash them thoroughly with soap and water.  Wash laundry thoroughly  Immediately remove and wash clothes or bedding that have blood, body fluids, and/or secretions or excretions, such as sweat, saliva, sputum, nasal mucus, vomit, urine, or feces, on them.  Wear gloves when handling laundry from the patient.  Read and follow directions on labels of laundry or clothing items and detergent. In general, wash and dry with the warmest temperatures recommended on the label.  Clean all areas the individual has used often  Clean all touchable surfaces, such as counters, tabletops, doorknobs, bathroom fixtures, toilets, phones, keyboards, tablets, and bedside tables, every day. Also, clean any surfaces that may have blood, body fluids, and/or secretions or excretions on them.  Wear gloves when cleaning surfaces the patient has come in contact with.  Use a diluted bleach solution (e.g., dilute bleach with 1 part bleach and 10 parts water) or a household disinfectant with a label that says EPA-registered for coronaviruses. To make a bleach solution at home, add 1 tablespoon of bleach to 1 quart (4 cups) of water. For a larger supply, add  cup of bleach to 1 gallon (16 cups) of water.  Read labels of cleaning products and follow recommendations provided on product labels. Labels contain instructions for safe and effective use of the cleaning product including precautions you should take when applying the product, such as wearing gloves or  eye protection and making sure you have good ventilation during use of the product.  Remove gloves and wash hands immediately after cleaning.  Monitor yourself for signs and symptoms of illness Caregivers and household members are considered close contacts, should monitor their health, and will be asked to limit movement outside of the home to the extent possible. Follow the monitoring steps for close contacts listed on the symptom monitoring  form.   ? If you have additional questions, contact your local health department or call the epidemiologist on call at [phone] (available 24/7). ? This guidance is subject to change. For the most up-to-date guidance from [facility], please refer to their website: [email or web address].pl

## 2019-06-27 NOTE — Plan of Care (Signed)
Priscille Loveless, RN

## 2019-06-27 NOTE — Progress Notes (Signed)
Occupational Therapy Treatment Patient Details Name: Erika Schmidt MRN: [id number] DOB: May 17, 1956 Today's Date: 06/27/2019    History of present illness Pt is a 63 y.o. female admitted 06/22/19 with worsening SOB, dx with COVID-19. PMH includes HTN, CHF, obesity.   OT comments  Pt progressing towards OT goals, presents seated in bathroom having just completing toileting/bathing ADL. Pt performing room level mobility at minguard assist level, intermittently using single UE support on items in room for stability during activity. Pt initially on 2L upon arrival with Spo2 noted decreased to 86% post activity, bumped pt to 3L with SpO2 maintaining >/=88%. Issued and further reviewed energy conservation strategies while completing functional tasks in preparation for discharge home, pt verbalizing understanding and handout issued. Will continue per POC at this time.   Follow Up Recommendations  No OT follow up    Equipment Recommendations  None recommended by OT          Precautions / Restrictions Precautions Precautions: Other (comment) Precaution Comments: watch SpO2 Restrictions Weight Bearing Restrictions: No       Mobility Bed Mobility               General bed mobility comments: OOB in bathroom upon arrival  Transfers Overall transfer level: Modified independent Equipment used: None Transfers: Sit to/from Stand Sit to Stand: Modified independent (Device/Increase time)              Balance Overall balance assessment: Needs assistance   Sitting balance-Leahy Scale: Good     Standing balance support: Single extremity supported;No upper extremity supported;During functional activity Standing balance-Leahy Scale: Fair Standing balance comment: pt able to maintain balance without UE support, but often seeking single UE support if available during mobility tasks                           ADL either performed or assessed with clinical judgement    ADL Overall ADL's : Needs assistance/impaired     Grooming: Sitting;Set up                   Toilet Transfer: Min guard;Ambulation Toilet Transfer Details (indicate cue type and reason): pt seated on toilet upon arrival, close minguard assist for mobility back to recliner, pt often utilizing furniture for single UE support with mobility tasks Toileting- Clothing Manipulation and Hygiene: Supervision/safety;Sitting/lateral lean;Sit to/from Nurse, children's Details (indicate cue type and reason): discussed option of using chair in shower for bathing ADL, pt reports spouse to assist Functional mobility during ADLs: Min guard General ADL Comments: pt reports just completed wash up prior to toileting ADL, further reviewed activity progression and energy conservation strategies as precursor to upcoming discharge home                       Cognition Arousal/Alertness: Awake/alert Behavior During Therapy: WFL for tasks assessed/performed;Flat affect Overall Cognitive Status: Within Functional Limits for tasks assessed                                          Exercises Exercises: Other exercises Other Exercises Other Exercises: IS - pt able to pull [phone] ml, x3 reps   Shoulder Instructions       General Comments      Pertinent Vitals/ Pain       Pain  Assessment: No/denies pain  Home Living                                          Prior Functioning/Environment              Frequency  Min 3X/week        Progress Toward Goals  OT Goals(current goals can now be found in the care plan section)  Progress towards OT goals: Progressing toward goals  Acute Rehab OT Goals Patient Stated Goal: Return home, better breathing OT Goal Formulation: With patient Time For Goal Achievement: 07/07/19 Potential to Achieve Goals: Good ADL Goals Pt/caregiver will Perform Home Exercise Program: Increased strength;Both  right and left upper extremity;With written HEP provided;With theraband Additional ADL Goal #1: Pt will recall and apply ECS strategies as they apply to BADL and functional mobility Additional ADL Goal #2: Pt will self monitor and maintain O2 saturations above 90% for safe and successful completion of BADL and functional mobility  Plan Discharge plan needs to be updated    Co-evaluation                 AM-PAC OT "6 Clicks" Daily Activity     Outcome Measure   Help from another person eating meals?: None Help from another person taking care of personal grooming?: A Little Help from another person toileting, which includes using toliet, bedpan, or urinal?: A Little Help from another person bathing (including washing, rinsing, drying)?: A Little Help from another person to put on and taking off regular upper body clothing?: None Help from another person to put on and taking off regular lower body clothing?: A Little 6 Click Score: 20    End of Session Equipment Utilized During Treatment: Oxygen  OT Visit Diagnosis: Unsteadiness on feet (R26.81);Other abnormalities of gait and mobility (R26.89);Other (comment)(decreased activity tolerance)   Activity Tolerance Patient tolerated treatment well   Patient Left in chair;with call bell/phone within reach   Nurse Communication Mobility status        Time: [id number] OT Time Calculation (min): 17 min  Charges: OT General Charges $OT Visit: 1 Visit OT Treatments $Self Care/Home Management : 8-22 mins  Lou Cal, OT Supplemental Rehabilitation Services Pager [phone] Office [place] 06/27/2019, 1:51 PM

## 2019-06-27 NOTE — TOC Progression Note (Signed)
Transition of Care [facility]) - Progression Note    Patient Details  Name: SHAWNDEE TEJERO MRN: [id number] Date of Birth: 25-Jul-1956  Transition of Care [facility]) CM/SW Contact  Joaquin Courts, RN Phone Number: 06/27/2019, 10:00 AM  Clinical Narrative:    CM spoke with spouse this morning and notified of impending dc. [employer] to deliver O2 concentrator to home. Patient will dc on Eliquis, 30-day free trial offer card was called into patient's pharmacy and copy of the card was faxed to nurses station to be provided to patient by bedside RN.   Expected Discharge Plan: Home/Self Care Barriers to Discharge: No Barriers Identified  Expected Discharge Plan and Services Expected Discharge Plan: Home/Self Care   Discharge Planning Services: CM Consult   Living arrangements for the past 2 months: Single Family Home Expected Discharge Date: 06/27/19               DME Arranged: Oxygen DME Agency: [place] Date DME Agency Contacted: 06/26/19 Time DME Agency Contacted: [phone] Representative spoke with at DME Agency: [place]: NA [place] Agency: NA         Social Determinants of Health ([place]) Interventions    Readmission Risk Interventions No flowsheet data found.

## 2019-06-27 NOTE — Discharge Summary (Signed)
Erika Schmidt [id number] DOB: 1955-10-02 DOA: 06/22/2019  PCP: Petra Kuba, MD  Admit date: 06/22/2019  Discharge date: 06/27/2019  Admitted From: Home   Disposition:  Home   Recommendations for Outpatient Follow-up:   Follow up with PCP in 1-2 weeks  PCP Please obtain BMP/CBC, 2 view CXR in 1week,  (see Discharge instructions)   PCP Please follow up on the following pending results: Check D dimer, CMP, CBC in 7-10 days   Home Health: PT, RN   Equipment/Devices: o2 2lit  Consultations: None  Discharge Condition: Stable    CODE STATUS: Full    Diet Recommendation: Heart Healthy   CC - SOB   Brief history of present illness from the day of admission and additional interim summary    Erika Schmidt a63 y.o.female,with H/O HTN,Chronic diastolic CHF EF 55 to 10% on echocardiogram noted from outside records, morbid obesity, patient was doing fairly well until about 5 to 6 days ago she and her husband started feeling poorly after getting flu shot, she was diagnosed with COVID-19 infection at [facility] urgent care on the seventh of this month, she tried to stay at home and take care of herself however she started feeling more short of breath and feverish hence came to [facility] ER yesterday evening where she was diagnosed with COVID-19 pneumonia, acute hypoxic respiratory failure, was placed on 4 L nasal cannula oxygen and transferred to [facility] Course    1. Acute Hypoxic Resp. Failure due to Acute Covid 19 Viral Pneumonitis during the ongoing 2020 Covid 19 Pandemic - she has been given IV steroids, remdesivir and convalescent plasma on the day of admission which was 06/22/2019.  Continue to get serially more hypoxic  requiring 15 L high flow nasal cannula oxygen even at rest, was given Actemra on 06/24/2019 after consent.  She has now shown remarkable improvement and now down to 1-2 L nasal cannula oxygen and symptom free, will be DC'd home on PO steroid taper and PRN 2lit o2, follow with PCP in 1 week.   SpO2: 98 % O2 Flow Rate (L/min): 2 L/min   COVID-19 Labs  Recent Labs    06/25/19 0500 06/26/19 0508 06/27/19 0500  DDIMER 3.87* 3.45* 2.11*  CRP 4.6* 1.5* 0.8    No results found for: SARSCOV2NAA Hepatic Function Latest Ref Rng & Units 06/27/2019 06/26/2019 06/25/2019  Total Protein 6.5 - 8.1 g/dL 6.9 7.1 6.9  Albumin 3.5 - 5.0 g/dL 3.3(L) 3.2(L) 3.1(L)  AST 15 - 41 U/L _0 ALT 0 - 44 U/L _1 Alk Phosphatase 38 - 126 U/L 69 71 82  Total Bilirubin 0.3 -  1.2 mg/dL 1.5(H) 1.2 0.8    2.Mild acute on chronic diastolic CHF last EF 55 to 60% in outside records. Resolved after IV lasix, continue home dose beta-blocker.  3.Hypertension. Stable on Home Rx.  4.Morbid obesity. Follow with PCP for weight loss.  5.Mild rise in high-sensitivity troponin at [facility] ER. Trend stable and not an ACS pattern, likely small troponin leak due to demand mismatch from hypoxia and tachycardia. Continue aspirin (after she stops Eliquis)  beta-blocker and add statin for secondary prevention. No chest pain and EKG was nonacute.   6.  UTI.  treated.  7. Mod elevation of D dimer - trend stable, inflammation related, 2 weeks of PO Eliquis, coupon given.   Discharge diagnosis     Active Problems:   COVID-19 virus infection    Discharge instructions    Discharge Instructions    Diet - low sodium heart healthy   Complete by: As directed    Discharge instructions   Complete by: As directed    Follow with Primary MD Petra Kuba, MD in 7 days   Get CBC, CMP, 2 view Chest X ray -  checked next visit within 1 week by Primary MD    Activity: As tolerated with Full fall  precautions use walker/cane & assistance as needed  Disposition Home   Diet: Heart Healthy   Special Instructions: If you have smoked or chewed Tobacco  in the last 2 yrs please stop smoking, stop any regular Alcohol  and or any Recreational drug use.  On your next visit with your primary care physician please Get Medicines reviewed and adjusted.  Please request your Prim.MD to go over all Hospital Tests and Procedure/Radiological results at the follow up, please get all Hospital records sent to your Prim MD by signing hospital release before you go home.  If you experience worsening of your admission symptoms, develop shortness of breath, life threatening emergency, suicidal or homicidal thoughts you must seek medical attention immediately by calling 911 or calling your MD immediately  if symptoms less severe.  You Must read complete instructions/literature along with all the possible adverse reactions/side effects for all the Medicines you take and that have been prescribed to you. Take any new Medicines after you have completely understood and accpet all the possible adverse reactions/side effects.                                     untitled image                                    [facility]                            [place], [place] [address]     Erika Schmidt was admitted to the Hospital on 06/22/2019 and Discharged  06/27/2019 and should be excused from work/school   for 21  days starting from date -  06/22/2019 , may return to work/school without any restrictions.  Call Lala Lund MD, [facility]  [phone] with questions.  Lala Lund M.D on 06/27/2019,at 9:02 AM  [facility]  Office  [phone]   Increase activity slowly   Complete by: As directed    MyChart COVID-19 home monitoring program   Complete by: Jun 27, 2019    Is the patient willing to use the [place]  for home monitoring?: Yes   Temperature monitoring   Complete by: Jun 27, 2019    After how many days would you like to receive a notification of this patient's flowsheet entries?: 1      Discharge Medications   Allergies as of 06/27/2019      Reactions   Floxin [ofloxacin]       Medication List    TAKE these medications   albuterol 108 (90 Base) MCG/ACT inhaler Commonly known as: VENTOLIN HFA Inhale 2 puffs into the lungs every 6 (six) hours as needed for wheezing or shortness of breath.   apixaban 2.5 MG Tabs tablet Commonly known as: Eliquis Take 1 tablet (2.5 mg total) by mouth 2 (two) times daily.   aspirin 81 MG tablet Take 1 tablet (81 mg total) by mouth daily. Start taking on: July 12, 2019 What changed: These instructions start on July 12, 2019. If you are unsure what to do until then, ask your doctor or other care provider.   furosemide 20 MG tablet Commonly known as: LASIX Take 20 mg by mouth daily.   lisinopril 5 MG tablet Commonly known as: ZESTRIL Take 5 mg by mouth daily.   metoprolol succinate 25 MG 24 hr tablet Commonly known as: TOPROL-XL Take 25 mg by mouth daily.   potassium chloride 10 MEQ tablet Commonly known as: KLOR-CON Take 20 mEq by mouth daily.   predniSONE 5 MG tablet Commonly known as: DELTASONE Label  & dispense according to the schedule below. take 8 Pills PO for 3 days, 6 Pills PO for 3 days, 4 Pills PO for 3 days, 2 Pills PO for 3 days, 1 Pills PO for 3 days, 1/2 Pill  PO for 3 days then STOP. Total 65 pills.            Durable Medical Equipment  (From admission, onward)         Start     Ordered   06/26/19 0944  For home use only DME oxygen  Once    Question Answer Comment  Length of Need 6 Months   Mode or (Route) Mask   Liters per Minute 2   Frequency Continuous (stationary and portable oxygen unit needed)   Oxygen conserving device Yes   Oxygen delivery system Gas      06/26/19 0943           Follow-up Information    Petra Kuba, MD. Schedule an appointment as soon as possible for a visit in 1 week(s).   Specialty: Family Medicine Contact information: [address] [place] [address] [phone]           Major procedures and Radiology Reports - PLEASE review detailed and final reports thoroughly  -        Dg Chest Port 1 View  Result Date: 06/22/2019 CLINICAL DATA:  Shortness of breath, history CHF, hypertension, COVID-19 EXAM: PORTABLE CHEST 1 VIEW COMPARISON:  Portable exam 1506 hours compared to 06/21/2019 FINDINGS: Markedly lordotic positioning. Normal heart size. Patchy BILATERAL airspace infiltrates consistent with pneumonia. No gross pleural effusion or pneumothorax. IMPRESSION: Patchy BILATERAL pulmonary infiltrates consistent with pneumonia. Electronically Signed   By: Lavonia Dana M.D.   On: 06/22/2019 15:20   Dg  Chest Port 1 View  Result Date: 06/21/2019 CLINICAL DATA:  COVID-19 positive, weakness, cramping of the legs, increasing shortness of breath, worsening weakness, history CHF, hypertension EXAM: PORTABLE CHEST 1 VIEW COMPARISON:  Portable exam 1552 hours compared to 08/11/2011 FINDINGS: Upper normal heart size. Mediastinal contours normal. Patchy airspace infiltrates bilaterally consistent with pneumonia. No pleural effusion or pneumothorax. Osseous structures unremarkable. IMPRESSION: Patchy BILATERAL pulmonary infiltrates consistent with pneumonia. Electronically Signed   By: Lavonia Dana M.D.   On: 06/21/2019 16:02   [place] Ekg Site Rite  Result Date: 06/22/2019 If Site Rite image not attached, placement could not be confirmed due to current cardiac rhythm.   Micro Results    Recent Results (from the past 240 hour(s))  Blood Culture (routine x 2)     Status: None   Collection Time: 06/21/19  3:37 PM   Specimen: BLOOD  Result Value Ref Range Status   Specimen Description BLOOD RIGHT ANTECUBITAL  Final   Special Requests   Final     BOTTLES DRAWN AEROBIC AND ANAEROBIC Blood Culture results may not be optimal due to an excessive volume of blood received in culture bottles   Culture   Final    NO GROWTH 5 DAYS Performed at [facility], [address] [place], [place] [address]    Report Status 06/26/2019 FINAL  Final  Blood Culture (routine x 2)     Status: None   Collection Time: 06/21/19  3:38 PM   Specimen: BLOOD  Result Value Ref Range Status   Specimen Description BLOOD BLOOD RIGHT HAND  Final   Special Requests   Final    BOTTLES DRAWN AEROBIC AND ANAEROBIC Blood Culture adequate volume   Culture   Final    NO GROWTH 5 DAYS Performed at [facility], [address] [place], [place] [address]    Report Status 06/26/2019 FINAL  Final  Culture, Urine     Status: Abnormal   Collection Time: 06/23/19  4:12 PM   Specimen: Urine, Clean Catch  Result Value Ref Range Status   Specimen Description   Final    URINE, CLEAN CATCH Performed at [facility], [place] [address] [place], [place] [address]    Special Requests   Final    NONE Performed at [facility], [place] [address] [place], [place] [address]    Culture >=100,000 COLONIES/mL KLEBSIELLA PNEUMONIAE (A)  Final   Report Status 06/25/2019 FINAL  Final   Organism ID, Bacteria KLEBSIELLA PNEUMONIAE (A)  Final      Susceptibility   Klebsiella pneumoniae - MIC*    AMPICILLIN >=32 RESISTANT Resistant     CEFAZOLIN <=4 SENSITIVE Sensitive     CEFTRIAXONE <=1 SENSITIVE Sensitive     CIPROFLOXACIN <=0.25 SENSITIVE Sensitive     GENTAMICIN <=1 SENSITIVE Sensitive     IMIPENEM <=0.25 SENSITIVE Sensitive     NITROFURANTOIN 64 INTERMEDIATE Intermediate     TRIMETH/SULFA <=20 SENSITIVE Sensitive     AMPICILLIN/SULBACTAM 4 SENSITIVE Sensitive     PIP/TAZO <=4 SENSITIVE Sensitive     Extended ESBL NEGATIVE Sensitive     * >=100,000 COLONIES/mL KLEBSIELLA PNEUMONIAE    Today   Subjective    Erika Schmidt today has no headache,no chest abdominal pain,no new weakness tingling or numbness, feels much better wants to go home today.    Objective   Blood pressure (!) 143/84, pulse 76, temperature 97.6 F (36.4 C), temperature source Oral, resp. rate 17, height  _0  (1.6 m), weight 110 kg, SpO2 98 %.   Intake/Output Summary (Last 24 hours) at 06/27/2019 0959 Last data filed at 06/27/2019 0900 Gross per 24 hour  Intake 750 ml  Output 3125 ml  Net -2375 ml    Exam Awake Alert, Oriented x 3, No new F.N deficits, Normal affect [place].AT,PERRAL Supple Neck,No JVD, No cervical lymphadenopathy appriciated.  Symmetrical Chest wall movement, Good air movement bilaterally, CTAB RRR,No Gallops,Rubs or new Murmurs, No Parasternal Heave +ve B.Sounds, Abd Soft, Non tender, No organomegaly appriciated, No rebound -guarding or rigidity. No Cyanosis, Clubbing or edema, No new Rash or bruise   Data Review   CBC w Diff:  Lab Results  Component Value Date   WBC 8.5 06/27/2019   HGB 13.8 06/27/2019   HGB 11.6 (L) 08/12/2011   HCT 42.8 06/27/2019   HCT 37.1 08/11/2011   PLT 400 06/27/2019   PLT 278 08/11/2011   LYMPHOPCT 16 06/27/2019   MONOPCT 12 06/27/2019   EOSPCT 0 06/27/2019   BASOPCT 1 06/27/2019    CMP:  Lab Results  Component Value Date   NA 137 06/27/2019   NA 145 08/12/2011   K 4.8 06/27/2019   K 3.9 08/12/2011   CL 102 06/27/2019   CL 107 08/12/2011   CO2 25 06/27/2019   CO2 27 08/12/2011   BUN 29 (H) 06/27/2019   BUN 12 08/12/2011   CREATININE 0.84 06/27/2019   CREATININE 0.80 08/12/2011   PROT 6.9 06/27/2019   PROT 7.4 08/11/2011   ALBUMIN 3.3 (L) 06/27/2019   ALBUMIN 3.4 08/11/2011   BILITOT 1.5 (H) 06/27/2019   BILITOT 0.5 08/11/2011   ALKPHOS 69 06/27/2019   ALKPHOS 73 08/11/2011   AST 16 06/27/2019   AST 28 08/11/2011   ALT 18 06/27/2019   ALT 22 08/11/2011  .   Total Time in preparing paper work, data evaluation and todays exam - 7 minutes   Lala Lund M.D on 06/27/2019 at 9:59 AM  [facility]   Office  [phone]

## 2019-07-03 ENCOUNTER — Other Ambulatory Visit: Payer: Self-pay | Admitting: Family Medicine

## 2019-07-03 DIAGNOSIS — U071 COVID-19: Secondary | ICD-10-CM

## 2019-07-13 ENCOUNTER — Ambulatory Visit
Admission: RE | Admit: 2019-07-13 | Discharge: 2019-07-13 | Disposition: A | Discharge status: Home / Self Care | Payer: 59 | Source: Ambulatory Visit | Attending: Family Medicine | Admitting: Family Medicine

## 2019-07-13 DIAGNOSIS — U071 COVID-19: Secondary | ICD-10-CM

## 2019-09-06 ENCOUNTER — Other Ambulatory Visit: Payer: Self-pay | Admitting: Physician Assistant

## 2019-09-06 ENCOUNTER — Ambulatory Visit
Admission: RE | Admit: 2019-09-06 | Discharge: 2019-09-06 | Disposition: A | Discharge status: Home / Self Care | Payer: 59 | Source: Ambulatory Visit | Attending: Physician Assistant | Admitting: Physician Assistant

## 2019-09-06 DIAGNOSIS — U071 COVID-19: Secondary | ICD-10-CM | POA: Insufficient documentation

## 2019-09-06 DIAGNOSIS — J1282 Pneumonia due to coronavirus disease 2019: Secondary | ICD-10-CM

## 2019-09-08 ENCOUNTER — Other Ambulatory Visit: Payer: Self-pay | Admitting: Physician Assistant

## 2019-09-08 DIAGNOSIS — Z1231 Encounter for screening mammogram for malignant neoplasm of breast: Secondary | ICD-10-CM

## 2019-10-02 ENCOUNTER — Other Ambulatory Visit: Payer: Self-pay | Admitting: Physician Assistant

## 2019-10-02 DIAGNOSIS — R9389 Abnormal findings on diagnostic imaging of other specified body structures: Secondary | ICD-10-CM

## 2019-10-09 ENCOUNTER — Ambulatory Visit: Payer: 59

## 2019-10-17 ENCOUNTER — Other Ambulatory Visit: Payer: 59

## 2019-10-17 ENCOUNTER — Ambulatory Visit: Payer: 59

## 2019-10-24 ENCOUNTER — Ambulatory Visit: Payer: 59

## 2019-11-15 ENCOUNTER — Ambulatory Visit
Admission: RE | Admit: 2019-11-15 | Discharge: 2019-11-15 | Disposition: A | Discharge status: Home / Self Care | Payer: 59 | Source: Ambulatory Visit | Attending: Family Medicine | Admitting: Family Medicine

## 2019-11-15 ENCOUNTER — Other Ambulatory Visit: Payer: Self-pay | Admitting: Family Medicine

## 2019-11-15 DIAGNOSIS — Z09 Encounter for follow-up examination after completed treatment for conditions other than malignant neoplasm: Secondary | ICD-10-CM | POA: Diagnosis present

## 2019-11-15 DIAGNOSIS — Z8616 Personal history of COVID-19: Secondary | ICD-10-CM | POA: Diagnosis not present

## 2019-12-28 ENCOUNTER — Ambulatory Visit
Admission: RE | Admit: 2019-12-28 | Discharge: 2019-12-28 | Disposition: A | Discharge status: Home / Self Care | Payer: 59 | Source: Ambulatory Visit | Attending: Physician Assistant | Admitting: Physician Assistant

## 2019-12-28 DIAGNOSIS — Z1231 Encounter for screening mammogram for malignant neoplasm of breast: Secondary | ICD-10-CM | POA: Insufficient documentation

## 2021-07-01 ENCOUNTER — Other Ambulatory Visit: Payer: Self-pay | Admitting: Internal Medicine

## 2021-07-01 DIAGNOSIS — R0602 Shortness of breath: Secondary | ICD-10-CM

## 2021-07-01 DIAGNOSIS — J189 Pneumonia, unspecified organism: Secondary | ICD-10-CM

## 2021-07-29 ENCOUNTER — Ambulatory Visit: Payer: Medicare HMO

## 2021-08-27 ENCOUNTER — Ambulatory Visit: Payer: Medicare HMO

## 2021-09-10 ENCOUNTER — Ambulatory Visit
Admission: RE | Admit: 2021-09-10 | Discharge: 2021-09-10 | Disposition: A | Discharge status: Home / Self Care | Payer: Medicare HMO | Source: Ambulatory Visit | Attending: Internal Medicine | Admitting: Internal Medicine

## 2021-09-10 ENCOUNTER — Other Ambulatory Visit: Payer: Self-pay

## 2021-09-10 DIAGNOSIS — J189 Pneumonia, unspecified organism: Secondary | ICD-10-CM | POA: Insufficient documentation

## 2021-09-10 DIAGNOSIS — R0602 Shortness of breath: Secondary | ICD-10-CM | POA: Insufficient documentation

## 2021-09-15 ENCOUNTER — Other Ambulatory Visit: Payer: Self-pay | Admitting: Internal Medicine

## 2021-09-15 DIAGNOSIS — Z1231 Encounter for screening mammogram for malignant neoplasm of breast: Secondary | ICD-10-CM

## 2021-10-07 ENCOUNTER — Emergency Department: Payer: Medicare HMO

## 2021-10-07 ENCOUNTER — Emergency Department
Admission: EM | Admit: 2021-10-07 | Discharge: 2021-10-07 | Disposition: A | Discharge status: Home / Self Care | Payer: Medicare HMO | Attending: Emergency Medicine | Admitting: Emergency Medicine

## 2021-10-07 ENCOUNTER — Other Ambulatory Visit: Payer: Self-pay

## 2021-10-07 DIAGNOSIS — R7401 Elevation of levels of liver transaminase levels: Secondary | ICD-10-CM | POA: Insufficient documentation

## 2021-10-07 DIAGNOSIS — R059 Cough, unspecified: Secondary | ICD-10-CM | POA: Insufficient documentation

## 2021-10-07 DIAGNOSIS — Z20822 Contact with and (suspected) exposure to covid-19: Secondary | ICD-10-CM | POA: Insufficient documentation

## 2021-10-07 DIAGNOSIS — I10 Essential (primary) hypertension: Secondary | ICD-10-CM | POA: Insufficient documentation

## 2021-10-07 DIAGNOSIS — R079 Chest pain, unspecified: Secondary | ICD-10-CM | POA: Insufficient documentation

## 2021-10-07 DIAGNOSIS — R0602 Shortness of breath: Secondary | ICD-10-CM | POA: Diagnosis not present

## 2021-10-07 DIAGNOSIS — R52 Pain, unspecified: Secondary | ICD-10-CM

## 2021-10-07 DIAGNOSIS — R112 Nausea with vomiting, unspecified: Secondary | ICD-10-CM | POA: Diagnosis not present

## 2021-10-07 LAB — CBC WITH DIFFERENTIAL/PLATELET
Abs Immature Granulocytes: 0.05 10*3/uL (ref 0.00–0.07)
Basophils Absolute: 0.1 10*3/uL (ref 0.0–0.1)
Basophils Relative: 0 %
Eosinophils Absolute: 0.3 10*3/uL (ref 0.0–0.5)
Eosinophils Relative: 2 %
HCT: 47.5 % — ABNORMAL HIGH (ref 36.0–46.0)
Hemoglobin: 15.1 g/dL — ABNORMAL HIGH (ref 12.0–15.0)
Immature Granulocytes: 0 %
Lymphocytes Relative: 15 %
Lymphs Abs: 1.8 10*3/uL (ref 0.7–4.0)
MCH: 25.5 pg — ABNORMAL LOW (ref 26.0–34.0)
MCHC: 31.8 g/dL (ref 30.0–36.0)
MCV: 80.2 fL (ref 80.0–100.0)
Monocytes Absolute: 0.8 10*3/uL (ref 0.1–1.0)
Monocytes Relative: 6 %
Neutro Abs: 9.6 10*3/uL — ABNORMAL HIGH (ref 1.7–7.7)
Neutrophils Relative %: 77 %
Platelets: 287 10*3/uL (ref 150–400)
RBC: 5.92 MIL/uL — ABNORMAL HIGH (ref 3.87–5.11)
RDW: 14.5 % (ref 11.5–15.5)
WBC: 12.6 10*3/uL — ABNORMAL HIGH (ref 4.0–10.5)
nRBC: 0 % (ref 0.0–0.2)

## 2021-10-07 LAB — RESP PANEL BY RT-PCR (FLU A&B, COVID) ARPGX2
Influenza A by PCR: NEGATIVE
Influenza B by PCR: NEGATIVE
SARS Coronavirus 2 by RT PCR: NEGATIVE

## 2021-10-07 LAB — URINALYSIS, ROUTINE W REFLEX MICROSCOPIC
Bilirubin Urine: NEGATIVE
Glucose, UA: NEGATIVE mg/dL
Ketones, ur: 5 mg/dL — AB
Nitrite: NEGATIVE
Protein, ur: NEGATIVE mg/dL
Specific Gravity, Urine: 1.009 (ref 1.005–1.030)
pH: 7 (ref 5.0–8.0)

## 2021-10-07 LAB — COMPREHENSIVE METABOLIC PANEL
ALT: 61 U/L — ABNORMAL HIGH (ref 0–44)
AST: 127 U/L — ABNORMAL HIGH (ref 15–41)
Albumin: 3.7 g/dL (ref 3.5–5.0)
Alkaline Phosphatase: 146 U/L — ABNORMAL HIGH (ref 38–126)
Anion gap: 6 (ref 5–15)
BUN: 12 mg/dL (ref 8–23)
CO2: 26 mmol/L (ref 22–32)
Calcium: 8.9 mg/dL (ref 8.9–10.3)
Chloride: 104 mmol/L (ref 98–111)
Creatinine, Ser: 0.76 mg/dL (ref 0.44–1.00)
GFR, Estimated: >60 mL/min (ref >60)
Glucose, Bld: 111 mg/dL — ABNORMAL HIGH (ref 70–99)
Potassium: 4.2 mmol/L (ref 3.5–5.1)
Sodium: 136 mmol/L (ref 135–145)
Total Bilirubin: 2.9 mg/dL — ABNORMAL HIGH (ref 0.3–1.2)
Total Protein: 8 g/dL (ref 6.5–8.1)

## 2021-10-07 LAB — LIPASE, BLOOD: Lipase: 40 U/L (ref 11–51)

## 2021-10-07 LAB — TROPONIN I (HIGH SENSITIVITY)
Troponin I (High Sensitivity): 5 ng/L (ref <18)
Troponin I (High Sensitivity): 5 ng/L (ref <18)

## 2021-10-07 LAB — BRAIN NATRIURETIC PEPTIDE: B Natriuretic Peptide: 52 pg/mL (ref 0.0–100.0)

## 2021-10-07 MED ORDER — MORPHINE SULFATE (PF) 4 MG/ML IV SOLN
4.0000 mg | Freq: Once | INTRAVENOUS | Status: AC
  Administered 2021-10-07: 4 mg via INTRAVENOUS
  Filled 2021-10-07: qty 1

## 2021-10-07 MED ORDER — LORAZEPAM 2 MG/ML IJ SOLN
1.0000 mg | Freq: Once | INTRAMUSCULAR | Status: DC
  Filled 2021-10-07: qty 1

## 2021-10-07 MED ORDER — GADOBUTROL 1 MMOL/ML IV SOLN: 7.5000 mL | Freq: Once | INTRAVENOUS | Status: DC | PRN

## 2021-10-07 MED ORDER — GADOBUTROL 1 MMOL/ML IV SOLN
10.0000 mL | Freq: Once | INTRAVENOUS | Status: AC | PRN
  Administered 2021-10-07: 10 mL via INTRAVENOUS

## 2021-10-07 NOTE — ED Provider Notes (Signed)
[facility] Provider Note    Event Date/Time   First MD Initiated Contact with Patient 10/07/21 [phone]     (approximate)   History   Chest Pain   HPI  Erika Schmidt is a 66 y.o. female who presents to the ED for evaluation of Chest Pain   Review outpatient PCP visit from 2/6. Her annual Medicare visit.  HTN, HLD and morbid obesity.  Outpatient CT chest on 2/1, and patient was given a 10-day course of Augmentin to treat multifocal pneumonia.  Patient presents to the ED via EMS from home for evaluation of chest pain, nausea and an episode of emesis this evening.  Pain started while she was at rest, seated on the couch, around 7 PM.  Persistent pain with associated nausea since that time.  Reports her cough improved and denies any residual symptoms from her pneumonia few weeks ago.   Physical Exam   Triage Vital Signs: ED Triage Vitals  Enc Vitals Group     BP      Pulse      Resp      Temp      Temp src      SpO2      Weight      Height      Head Circumference      Peak Flow      Pain Score      Pain Loc      Pain Edu?      Excl. in [place]?     Most recent vital signs: Vitals:   10/07/21 0430 10/07/21 0530  BP: (!) 130/57 124/60  Pulse: 85 83  Resp: 14 18  Temp:    SpO2: 92% 94%    General: Awake, no distress.  Morbidly obese.  Appears uncomfortable. CV:  Good peripheral perfusion. RRR Resp:  Normal effort.  Clear lungs Abd:  No distention.  Epigastric and RUQ tenderness.  Benign lower abdomen. MSK:  No deformity noted.  Neuro:  No focal deficits appreciated. Other:     ED Results / Procedures / Treatments   Labs (all labs ordered are listed, but only abnormal results are displayed) Labs Reviewed  COMPREHENSIVE METABOLIC PANEL - Abnormal; Notable for the following components:      Result Value   Glucose, Bld 111 (*)    AST 127 (*)    ALT 61 (*)    Alkaline Phosphatase 146 (*)    Total Bilirubin 2.9 (*)    All other  components within normal limits  CBC WITH DIFFERENTIAL/PLATELET - Abnormal; Notable for the following components:   WBC 12.6 (*)    RBC 5.92 (*)    Hemoglobin 15.1 (*)    HCT 47.5 (*)    MCH 25.5 (*)    Neutro Abs 9.6 (*)    All other components within normal limits  RESP PANEL BY RT-PCR (FLU A&B, COVID) ARPGX2  BRAIN NATRIURETIC PEPTIDE  LIPASE, BLOOD  URINALYSIS, ROUTINE W REFLEX MICROSCOPIC  TROPONIN I (HIGH SENSITIVITY)  TROPONIN I (HIGH SENSITIVITY)    EKG Sinus rhythm, rate of 80 bpm.  Normal axis and intervals.  No evidence of acute ischemia  RADIOLOGY CXR reviewed by me without evidence of acute cardiopulmonary pathology. RUQ ultrasound reviewed by me without evidence of biliary stones or obstruction.  Official radiology report(s): DG Chest 2 View  Result Date: 10/07/2021 CLINICAL DATA:  Chest pain and shortness of breath. EXAM: CHEST - 2 VIEW COMPARISON:  Chest radiograph  dated 11/15/2019. FINDINGS: Bilateral streaky densities and diffuse interstitial coarsening may be chronic. Developing infiltrate is less likely but not excluded no focal consolidation, pleural effusion, or pneumothorax. The cardiac silhouette is within limits. No acute osseous pathology. IMPRESSION: Probable chronic bronchitic changes. No focal consolidation. Electronically Signed   By: Anner Crete M.D.   On: 10/07/2021 03:08    PROCEDURES and INTERVENTIONS:  Procedures  Medications  morphine (PF) 4 MG/ML injection 4 mg (4 mg Intravenous Given 10/07/21 0324)     IMPRESSION / MDM / ASSESSMENT AND PLAN / ED COURSE  I reviewed the triage vital signs and the nursing notes.  66 year old female presents to the ED with epigastric and chest discomfort with LFT derangements concerning for the possibility of choledocholithiasis.  Reassuring work-up from a cardiopulmonary perspective without evidence of ACS, volume overload or CHF, CAP.  Mild nonspecific leukocytosis is noted.  Metabolic panel with LFT  derangements with elevated bilirubin, ALP and LFTs.  No evidence of pancreatitis.  Due to her continued epigastric discomfort and hyperbilirubinemia that is seems to be acute, concerned about choledocholithiasis.  We will obtain MRCP to help rule this out.  Clinical Course as of 10/07/21 0610  Tue Oct 07, 2021  0608 Reassessed and discussed work-up so far.  I discussed my concerns for choledocholithiasis.  We discussed MRI and she is agreeable.  She reports feeling better now and no longer has chest pain, but she is still tender to epigastric palpation. [DS]    Clinical Course User Index [DS] Vladimir Crofts, MD     FINAL CLINICAL IMPRESSION(S) / ED DIAGNOSES   Final diagnoses:  Chest pain  Transaminitis     Rx / DC Orders   ED Discharge Orders     None        Note:  This document was prepared using Dragon voice recognition software and may include unintentional dictation errors.   Vladimir Crofts, MD 10/07/21 [phone]

## 2021-10-07 NOTE — ED Triage Notes (Signed)
Pt to ED by EMS from home with c/o CP which began approx 6 hours ago. Pt also endorses nausea and recent treatment for PNA. Pt arrives A+O, BP is elevated, all other VSS. MD at bedside on arrival.

## 2021-10-07 NOTE — ED Provider Notes (Signed)
----------------------------------------- °  8:27 AM on 10/07/2021 ----------------------------------------- Patient care assumed from Dr. Tamala Julian.  MRCP is negative.  Patient reassured by negative results we will discharge home with PCP follow-up.   Harvest Dark, MD 10/07/21 [phone]

## 2021-10-23 ENCOUNTER — Other Ambulatory Visit: Payer: Self-pay

## 2021-10-23 ENCOUNTER — Ambulatory Visit
Admission: RE | Admit: 2021-10-23 | Discharge: 2021-10-23 | Disposition: A | Discharge status: Home / Self Care | Payer: Medicare HMO | Source: Ambulatory Visit | Attending: Internal Medicine | Admitting: Internal Medicine

## 2021-10-23 DIAGNOSIS — Z1231 Encounter for screening mammogram for malignant neoplasm of breast: Secondary | ICD-10-CM | POA: Diagnosis not present

## 2022-03-31 ENCOUNTER — Other Ambulatory Visit (HOSPITAL_COMMUNITY): Payer: Self-pay | Admitting: Internal Medicine

## 2022-03-31 ENCOUNTER — Other Ambulatory Visit: Payer: Self-pay | Admitting: Internal Medicine

## 2022-03-31 DIAGNOSIS — J189 Pneumonia, unspecified organism: Secondary | ICD-10-CM

## 2022-04-03 ENCOUNTER — Ambulatory Visit
Admission: RE | Admit: 2022-04-03 | Discharge: 2022-04-03 | Disposition: A | Discharge status: Home / Self Care | Payer: Medicare HMO | Source: Ambulatory Visit | Attending: Internal Medicine | Admitting: Internal Medicine

## 2022-04-03 DIAGNOSIS — J189 Pneumonia, unspecified organism: Secondary | ICD-10-CM | POA: Diagnosis present

## 2022-04-22 IMAGING — MG MM DIGITAL SCREENING BILAT W/ TOMO AND CAD
8 of 15 series · 8 of 40 positions shown · non-contrast
Comparison: Previous exam(s).

CLINICAL DATA: Screening.

EXAM:
DIGITAL SCREENING BILATERAL MAMMOGRAM WITH TOMOSYNTHESIS AND CAD
TECHNIQUE: Bilateral screening digital craniocaudal and mediolateral oblique
mammograms were obtained. Bilateral screening digital breast
tomosynthesis was performed. The images were evaluated with
computer-aided detection.

[R MLO synth-2D]
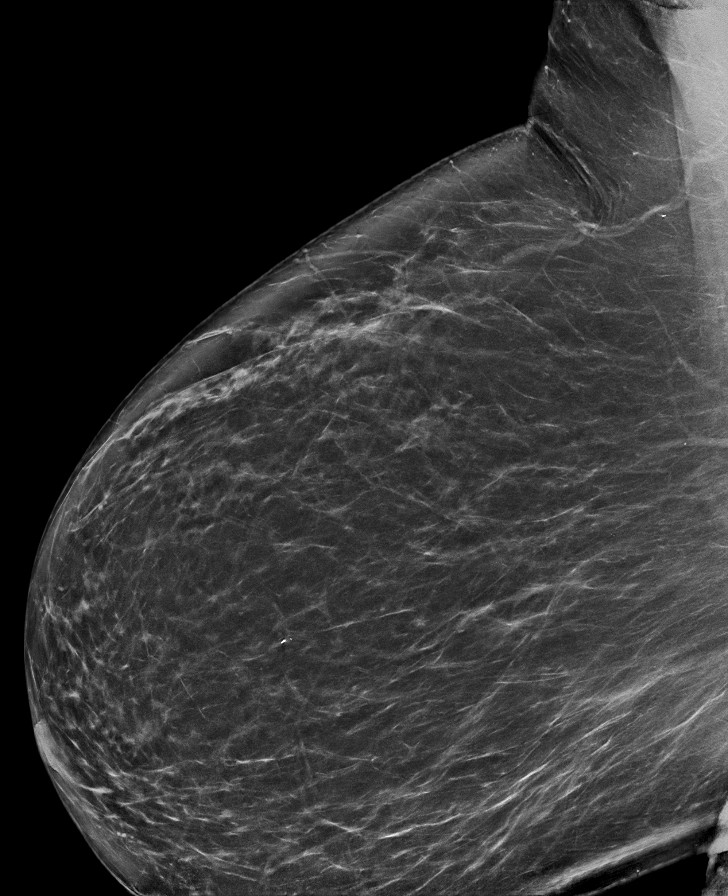

[R CC synth-2D (1 of 2)]
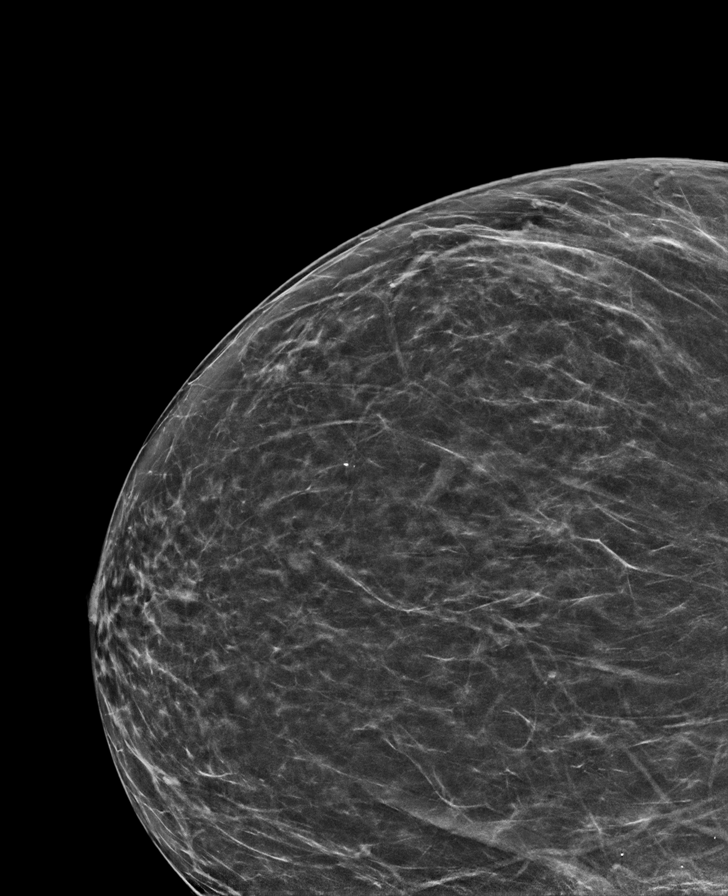

[L CC synth-2D (1 of 2)]
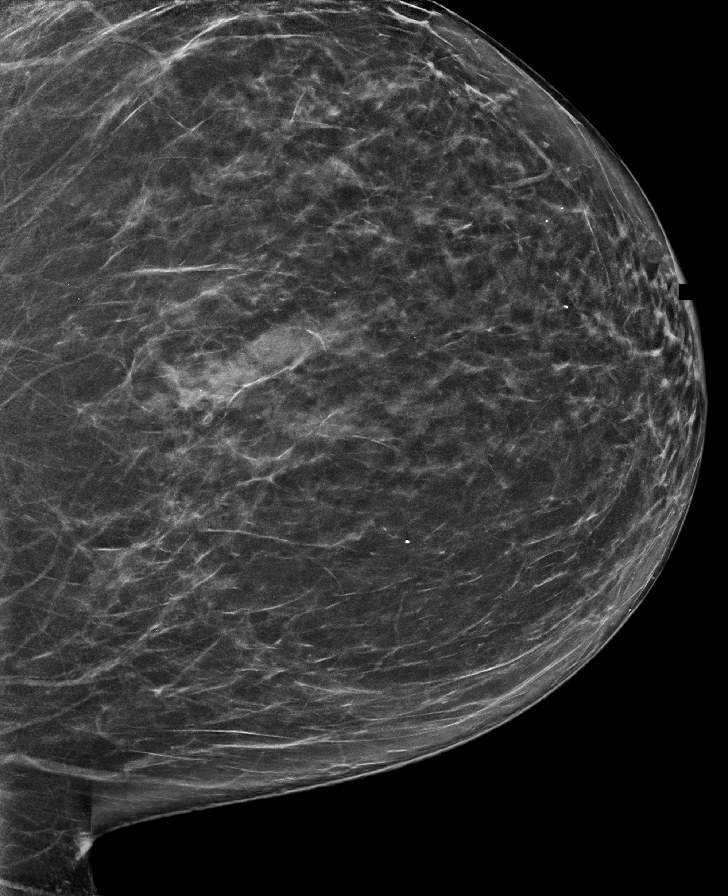

[L MLO synth-2D (1 of 2)]
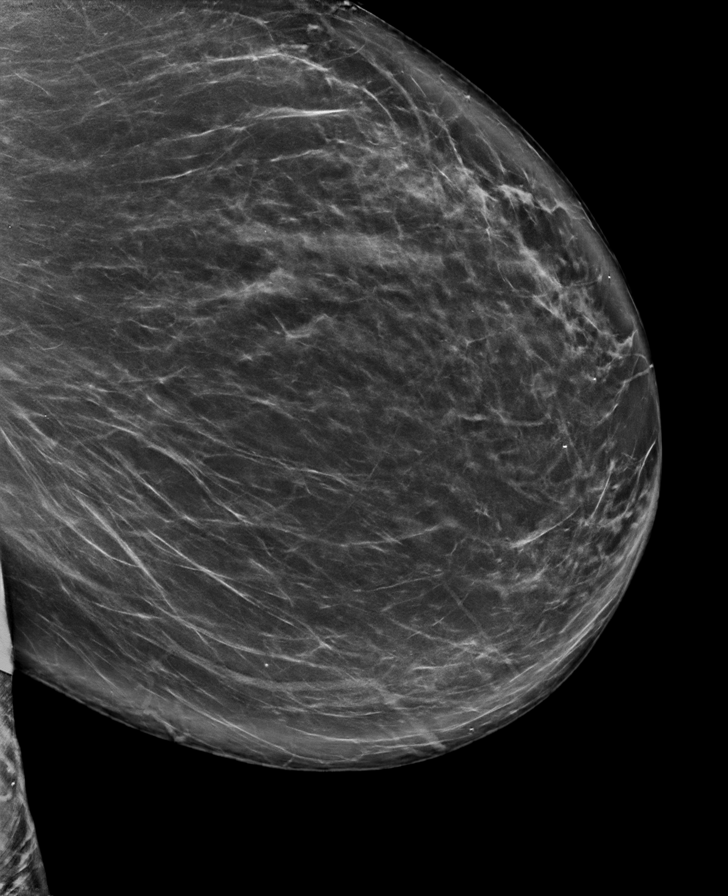

[L CC synth-2D (2 of 2)]
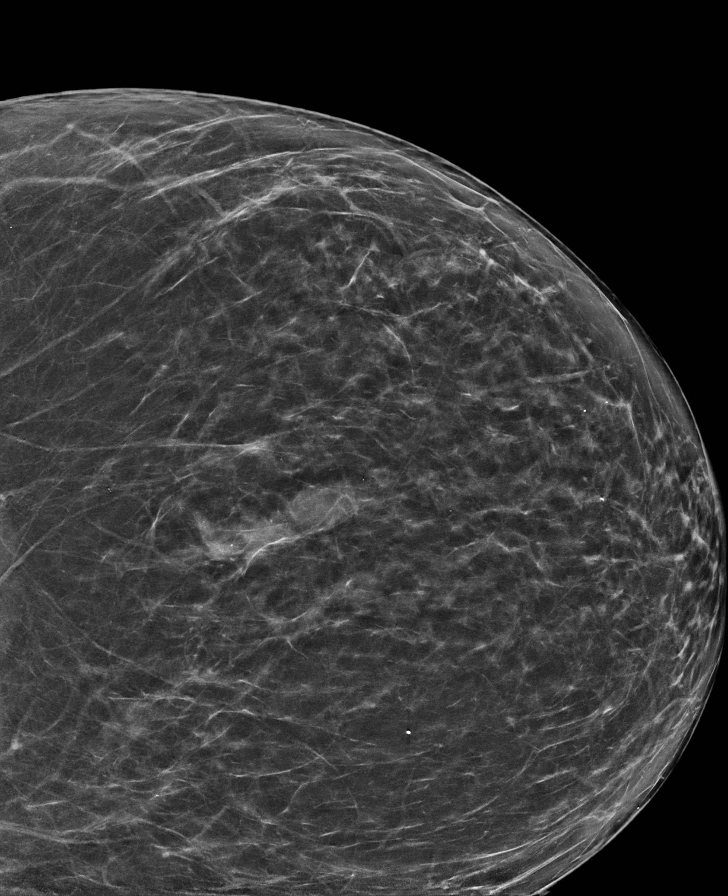

[R CC synth-2D (2 of 2)]
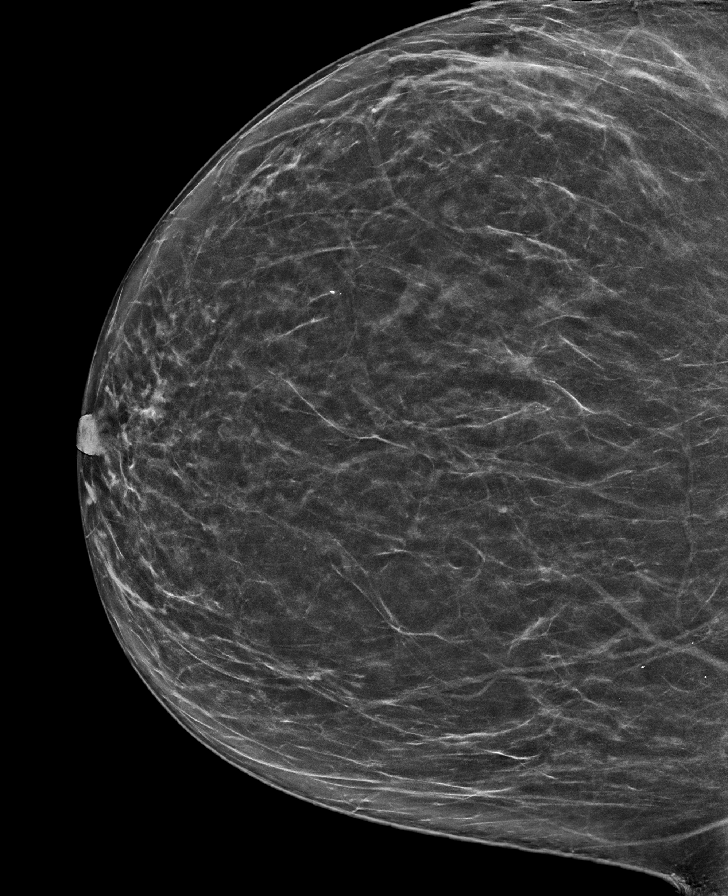

[L MLO synth-2D (2 of 2)]
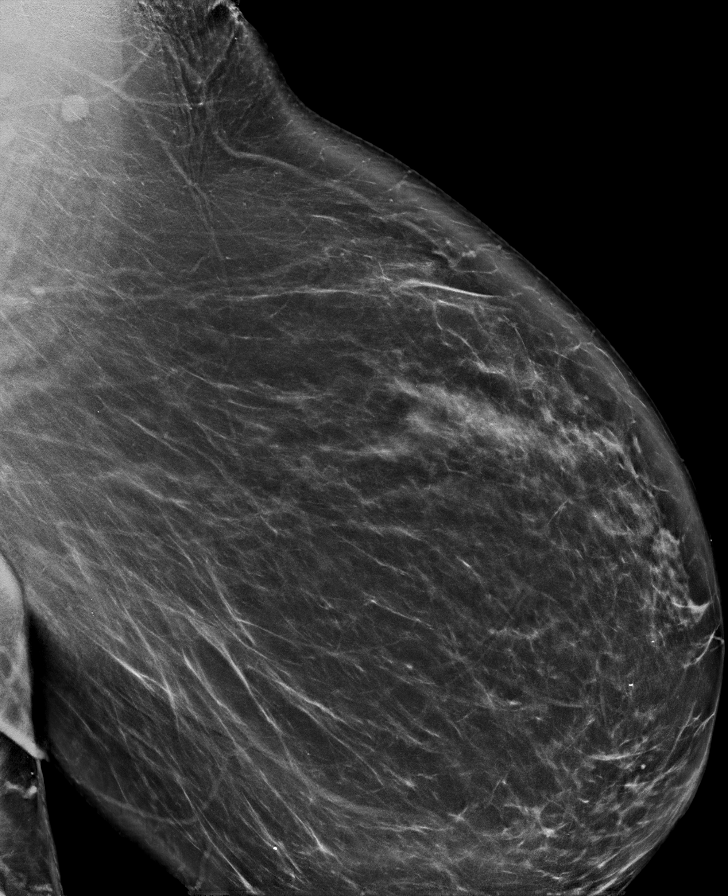

[L CC tomo · tomo slice 59/87.0]
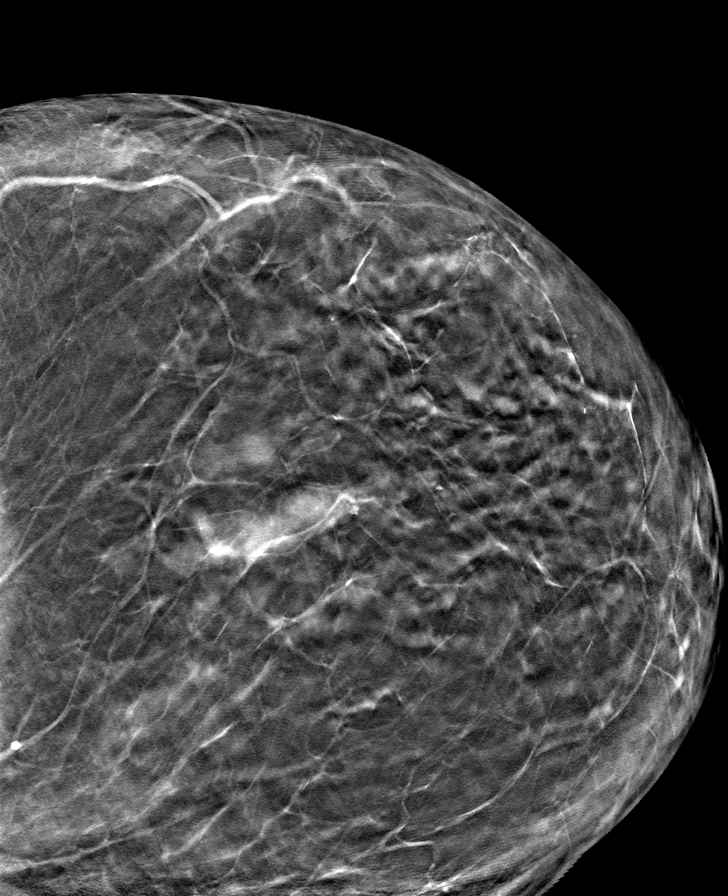

[8 of 40 positions shown; findings below may reference images not displayed]

ACR Breast Density Category c: The breast tissue is heterogeneously
dense, which may obscure small masses.
FINDINGS: There are no findings suspicious for malignancy.
IMPRESSION: No mammographic evidence of malignancy. A result letter of this
screening mammogram will be mailed directly to the patient.

RECOMMENDATION:
Screening mammogram in one year. (Code:Q3-W-BC3)

BI-RADS CATEGORY  1: Negative.

## 2022-09-16 ENCOUNTER — Other Ambulatory Visit: Payer: Self-pay

## 2022-09-16 DIAGNOSIS — R1084 Generalized abdominal pain: Secondary | ICD-10-CM

## 2022-09-25 ENCOUNTER — Ambulatory Visit: Admission: RE | Admit: 2022-09-25 | Payer: Medicare HMO | Source: Ambulatory Visit

## 2022-10-13 ENCOUNTER — Other Ambulatory Visit: Payer: Self-pay | Admitting: Internal Medicine

## 2022-10-13 DIAGNOSIS — Z1231 Encounter for screening mammogram for malignant neoplasm of breast: Secondary | ICD-10-CM

## 2022-10-29 ENCOUNTER — Ambulatory Visit
Admission: RE | Admit: 2022-10-29 | Discharge: 2022-10-29 | Disposition: A | Discharge status: Home / Self Care | Payer: Medicare HMO | Source: Ambulatory Visit | Attending: Internal Medicine | Admitting: Internal Medicine

## 2022-10-29 DIAGNOSIS — Z1231 Encounter for screening mammogram for malignant neoplasm of breast: Secondary | ICD-10-CM | POA: Diagnosis present

## 2023-02-12 ENCOUNTER — Other Ambulatory Visit: Payer: Self-pay | Admitting: Internal Medicine

## 2023-02-12 DIAGNOSIS — R0602 Shortness of breath: Secondary | ICD-10-CM

## 2023-02-15 ENCOUNTER — Encounter: Payer: Self-pay | Admitting: Internal Medicine

## 2023-02-18 ENCOUNTER — Ambulatory Visit
Admission: RE | Admit: 2023-02-18 | Discharge: 2023-02-18 | Disposition: A | Discharge status: Home / Self Care | Payer: Medicare HMO | Source: Ambulatory Visit | Attending: Internal Medicine | Admitting: Internal Medicine

## 2023-02-18 DIAGNOSIS — R0602 Shortness of breath: Secondary | ICD-10-CM

## 2023-02-18 MED ORDER — IOPAMIDOL (ISOVUE-370) INJECTION 76%
75.0000 mL | Freq: Once | INTRAVENOUS | Status: AC | PRN
  Administered 2023-02-18: 75 mL via INTRAVENOUS

## 2023-02-19 ENCOUNTER — Encounter: Payer: Self-pay | Admitting: Internal Medicine

## 2023-02-19 DIAGNOSIS — R0602 Shortness of breath: Secondary | ICD-10-CM

## 2023-02-19 DIAGNOSIS — I10 Essential (primary) hypertension: Secondary | ICD-10-CM

## 2023-03-25 MED ORDER — SODIUM CHLORIDE 0.9 % IV SOLN: INTRAVENOUS | Status: DC

## 2023-03-26 ENCOUNTER — Ambulatory Visit (HOSPITAL_BASED_OUTPATIENT_CLINIC_OR_DEPARTMENT_OTHER)
Admission: RE | Admit: 2023-03-26 | Discharge: 2023-03-26 | Disposition: A | Discharge status: Home / Self Care | Payer: Medicare HMO | Source: Home / Self Care | Attending: Cardiology | Admitting: Cardiology

## 2023-03-26 ENCOUNTER — Ambulatory Visit: Payer: Medicare HMO | Admitting: Anesthesiology

## 2023-03-26 ENCOUNTER — Encounter: Payer: Self-pay | Admitting: Internal Medicine

## 2023-03-26 ENCOUNTER — Encounter
Admission: RE | Disposition: A | Discharge status: Home / Self Care | Payer: Self-pay | Source: Home / Self Care | Attending: Internal Medicine

## 2023-03-26 ENCOUNTER — Other Ambulatory Visit: Payer: Self-pay

## 2023-03-26 ENCOUNTER — Ambulatory Visit
Admission: RE | Admit: 2023-03-26 | Discharge: 2023-03-26 | Disposition: A | Discharge status: Home / Self Care | Payer: Medicare HMO | Attending: Internal Medicine | Admitting: Internal Medicine

## 2023-03-26 DIAGNOSIS — I340 Nonrheumatic mitral (valve) insufficiency: Secondary | ICD-10-CM | POA: Insufficient documentation

## 2023-03-26 DIAGNOSIS — E782 Mixed hyperlipidemia: Secondary | ICD-10-CM | POA: Insufficient documentation

## 2023-03-26 DIAGNOSIS — E785 Hyperlipidemia, unspecified: Secondary | ICD-10-CM | POA: Diagnosis not present

## 2023-03-26 DIAGNOSIS — I110 Hypertensive heart disease with heart failure: Secondary | ICD-10-CM | POA: Insufficient documentation

## 2023-03-26 DIAGNOSIS — R0602 Shortness of breath: Secondary | ICD-10-CM | POA: Diagnosis not present

## 2023-03-26 DIAGNOSIS — I5189 Other ill-defined heart diseases: Secondary | ICD-10-CM | POA: Diagnosis not present

## 2023-03-26 DIAGNOSIS — I509 Heart failure, unspecified: Secondary | ICD-10-CM | POA: Diagnosis not present

## 2023-03-26 HISTORY — PX: TEE WITHOUT CARDIOVERSION: SHX5443

## 2023-03-26 SURGERY — ECHOCARDIOGRAM, TRANSESOPHAGEAL
Anesthesia: General

## 2023-03-26 MED ORDER — LIDOCAINE HCL (CARDIAC) PF 100 MG/5ML IV SOSY
PREFILLED_SYRINGE | INTRAVENOUS | Status: DC | PRN
  Administered 2023-03-26: 50 mg via INTRAVENOUS

## 2023-03-26 MED ORDER — LIDOCAINE VISCOUS HCL 2 % MT SOLN
OROMUCOSAL | Status: AC
  Filled 2023-03-26: qty 15

## 2023-03-26 MED ORDER — ACETAMINOPHEN 325 MG PO TABS: 650.0000 mg | ORAL_TABLET | Freq: Once | ORAL | Status: DC | PRN

## 2023-03-26 MED ORDER — ACETAMINOPHEN 160 MG/5ML PO SOLN: 325.0000 mg | ORAL | Status: DC | PRN

## 2023-03-26 MED ORDER — ONDANSETRON HCL 4 MG/2ML IJ SOLN: 4.0000 mg | Freq: Once | INTRAMUSCULAR | Status: DC | PRN

## 2023-03-26 MED ORDER — PROPOFOL 1000 MG/100ML IV EMUL
INTRAVENOUS | Status: AC
  Filled 2023-03-26: qty 100

## 2023-03-26 MED ORDER — PROPOFOL 10 MG/ML IV BOLUS
INTRAVENOUS | Status: DC | PRN
  Administered 2023-03-26: 60 mg via INTRAVENOUS
  Administered 2023-03-26: 40 mg via INTRAVENOUS
  Administered 2023-03-26: 30 mg via INTRAVENOUS
  Administered 2023-03-26: 20 mg via INTRAVENOUS

## 2023-03-26 MED ORDER — BUTAMBEN-TETRACAINE-BENZOCAINE 2-2-14 % EX AERO
INHALATION_SPRAY | CUTANEOUS | Status: AC
  Filled 2023-03-26: qty 5

## 2023-03-26 NOTE — Progress Notes (Signed)
*  PRELIMINARY RESULTS* Echocardiogram Echocardiogram Transesophageal has been performed.  Carolyne Fiscal 03/26/2023, 12:50 PM

## 2023-03-26 NOTE — Anesthesia Preprocedure Evaluation (Addendum)
Anesthesia Evaluation  Patient identified by MRN, date of birth, ID band Patient awake    Reviewed: Allergy & Precautions, NPO status , Patient's Chart, lab work & pertinent test results  History of Anesthesia Complications Negative for: history of anesthetic complications  Airway Mallampati: I   Neck ROM: Full    Dental  (+) Missing   Pulmonary  Pulmonary fibrosis   Pulmonary exam normal breath sounds clear to auscultation       Cardiovascular hypertension, +CHF  Normal cardiovascular exam+ Valvular Problems/Murmurs (moderate MR)  Rhythm:Regular Rate:Normal  ECG 03/23/23:  Sinus tachycardia  Possible Left atrial enlargement  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)  Anterolateral infarct (cited on or before 05-Mar-2015)   ECHO 03/01/23: NORMAL LEFT VENTRICULAR SYSTOLIC FUNCTION NORMAL RIGHT VENTRICULAR SYSTOLIC FUNCTION TRIVIAL REGURGITATION NOTED  NO VALVULAR STENOSIS GLS 17.6% Hyperechoic small mass attached to the mid LA wall - appears separate from the wall structure   Myocardial perfusion 01/03/20:  Normal treadmill EKG without evidence of ischemia or arrhythmia  Normal myocardial perfusion without evidence of myocardial ischemia     Neuro/Psych negative neurological ROS     GI/Hepatic negative GI ROS,,,  Endo/Other  Class 3 obesity  Renal/GU negative Renal ROS     Musculoskeletal   Abdominal   Peds  Hematology negative hematology ROS (+)   Anesthesia Other Findings   Reproductive/Obstetrics                             Anesthesia Physical Anesthesia Plan  ASA: 3  Anesthesia Plan: General   Post-op Pain Management:    Induction: Intravenous  PONV Risk Score and Plan: 3 and Propofol infusion, TIVA and Treatment may vary due to age or medical condition  Airway Management Planned: Natural Airway  Additional Equipment:   Intra-op Plan:   Post-operative  Plan:   Informed Consent: I have reviewed the patients History and Physical, chart, labs and discussed the procedure including the risks, benefits and alternatives for the proposed anesthesia with the patient or authorized representative who has indicated his/her understanding and acceptance.       Plan Discussed with: CRNA  Anesthesia Plan Comments: (LMA/GETA backup discussed.  Patient consented for risks of anesthesia including but not limited to:  - adverse reactions to medications - damage to eyes, teeth, lips or other oral mucosa - nerve damage due to positioning  - sore throat or hoarseness - damage to heart, brain, nerves, lungs, other parts of body or loss of life  Informed patient about role of CRNA in peri- and intra-operative care.  Patient voiced understanding.)        Anesthesia Quick Evaluation

## 2023-03-26 NOTE — Transfer of Care (Signed)
Immediate Anesthesia Transfer of Care Note  Patient: Erika Schmidt  Procedure(s) Performed: TRANSESOPHAGEAL ECHOCARDIOGRAM (TEE)  Patient Location: PACU  Anesthesia Type:MAC  Level of Consciousness: awake, oriented, and patient cooperative  Airway & Oxygen Therapy: Patient Spontanous Breathing and Patient connected to nasal cannula oxygen  Post-op Assessment: Report given to RN and Post -op Vital signs reviewed and stable  Post vital signs: Reviewed and stable  Last Vitals:  Vitals Value Taken Time  BP 108/66 1239  Temp    Pulse 85 03/26/23 1239  Resp 11 03/26/23 1239  SpO2 100 % 03/26/23 1239  Vitals shown include unfiled device data.  Last Pain:  Vitals:   03/26/23 1144  TempSrc: Oral  PainSc: 0-No pain         Complications: No notable events documented.

## 2023-03-26 NOTE — CV Procedure (Signed)
TEE: Under moderate sedation, TEE was performed without complications: LV: Normal. Normal EF. RV: Normal LA: Normal. Left atrial appendage: Normal without thrombus. Normal function. Inter atrial septum is intact with thickened portion in the mid-septum likely lipomatous hypertrophy. RA: Normal  MV: Normal Mild-mod MR. TV: Normal Trace TR AV: Normal. No AI or AS. PV: Normal. Trace PI.  Thoracic and ascending aorta: Normal without significant plaque or atheromatous changes.  Patient stable for discharge home ~ 1 hour post procedure.   Rozell Searing Kimi Kroft, DO 03/26/23 12:35 PM

## 2023-03-26 NOTE — H&P (Signed)
New Patient Visit   Chief Complaint: Chief Complaint  Patient presents with  Hypertension  Shortness of Breath  Date of Service: 03/23/2023 Date of Birth: 05-31-1956 PCP: Enid Baas, MD [address] [place] [address]  History of Present Illness:   Erika Schmidt is a 67 y.o.female patient with past medical history significant for hypertension, hyperlipidemia, and moderate mitral regurgitation who is here to re-establish care with cardiology. Patient was last seen in our office in 2021 for the management of hypertension. She recently had an echocardiogram which showed a left atrial mass. The patient denies history of TIA or stroke. She has never been told she had something like that before. Her shortness of breath is chronic in nature and has not changed significantly. She has not had a TEE to further work-up this left atrial mass. Patient is agreeable to proceed with this. Patient denies chest pain, palpitations, diaphoresis, syncope, edema, PND, orthopnea.  Past Medical and Surgical History  Past Medical History Past Medical History:  Diagnosis Date  CHF (congestive heart failure) (CMS/HHS-HCC)  Hyperplastic colon polyp 03/25/2015  Hypertension  Mixed hyperlipidemia 03/05/2015  Moderate mitral insufficiency 03/05/2015  Obesity  Pneumonia 09/2021   Past Surgical History She has a past surgical history that includes Colonoscopy (03/25/2015) and Hysterectomy Vaginal.   Medications and Allergies  Current Medications  Current Outpatient Medications on File Prior to Visit  Medication Sig Dispense Refill  acetaminophen (TYLENOL) 500 MG tablet Take 500 mg by mouth every 8 (eight) hours as needed for Pain  albuterol MDI, PROVENTIL, VENTOLIN, PROAIR, HFA 90 mcg/actuation inhaler INHALE 2 PUFFS BY MOUTH EVERY 6 HOURS AS NEEDED FOR WHEEZE 8.5 each 2  aspirin 81 MG EC tablet Take 81 mg by mouth once daily.  atorvastatin (LIPITOR) 40 MG tablet take 1 tablet by mouth every  day 90 tablet 1  dilTIAZem (CARDIZEM CD) 120 MG XR capsule TAKE 1 CAPSULE BY MOUTH ONCE DAILY 90 capsule 1  FUROsemide (LASIX) 20 MG tablet Take 1 tablet (20 mg total) by mouth once daily 90 tablet 1  losartan (COZAAR) 25 MG tablet TAKE 1 TABLET BY MOUTH EVERY DAY 90 tablet 1  predniSONE (DELTASONE) 2.5 MG tablet Take 1 tablet (2.5 mg total) by mouth once daily 90 tablet 0  predniSONE (DELTASONE) 20 MG tablet Take 2 tablets by mouth daily for 5 days followed by 1 tablet by mouth daily for 5 days and stop 15 tablet 0  sulfamethoxazole-trimethoprim (BACTRIM SS) 400-80 mg tablet Take 1 tablet (80 mg of trimethoprim total) by mouth 3 (three) times a week for 39 doses 39 tablet 0  triamcinolone 0.5 % cream Apply topically 2 (two) times daily 30 g 2   No current facility-administered medications on file prior to visit.   Allergies: Augmentin [amoxicillin-pot clavulanate] and Floxin [ofloxacin]  Social and Family History  Social History reports that she has never smoked. She has never used smokeless tobacco. She reports current alcohol use of about 3.0 standard drinks of alcohol per week. She reports that she does not use drugs.  Family History Family History  Problem Relation Name Age of Onset  Stroke Father Melburn Popper  Liver disease Father Melburn Popper  alcohol related  Colon cancer Neg Hx  Colon polyps Neg Hx  Rectal cancer Neg Hx  Ulcers Neg Hx   Review of Systems   Review of Systems  Respiratory: Positive for shortness of breath.   Physical Examination   Vitals:BP 110/64 (BP Location: Left upper arm, Patient  Position: Sitting, BP Cuff Size: Large Adult)  Pulse 102  Ht 160 cm (5\' 3" )  Wt 90.3 kg (199 lb)  BMI 35.25 kg/m  Ht:160 cm (5\' 3" ) Wt:90.3 kg (199 lb) ZOX:WRUE surface area is 2 meters squared. Body mass index is 35.25 kg/m.  Physical Exam Vitals reviewed.  HENT:  Head: Normocephalic and atraumatic.  Cardiovascular:  Rate and Rhythm: Normal rate and regular  rhythm.  Pulses: Normal pulses.  Heart sounds: Murmur heard.  Pulmonary:  Effort: Pulmonary effort is normal.  Breath sounds: Normal breath sounds.  Abdominal:  General: Bowel sounds are normal.  Musculoskeletal:  Right lower leg: No edema.  Left lower leg: No edema.  Skin: General: Skin is warm and dry.  Neurological:  Mental Status: She is alert.   Data & Results   Recent Labs  06/30/21 0903 03/16/22 1232 09/10/22 0843  CHOLTOTAL 144 128 146  HDL 31.6* 32.3* 33.9*  LDLCALC 85 76 89  VLDL 28 20 24   TRIG 139 100 118   Recent Labs  10/09/21 1154 03/16/22 1232 09/10/22 0843  NA 140 141 141  K 3.8 4.0 4.4  BUN 11 14 11   CREATININE 0.8 0.7 0.8  CO2 28.1 31.7 29.4  GLUCOSE 96 60* 88  ALT 47* 26 48*  AST 33 30 49*  TBILI 1.6* 1.1 1.5*  ALB 4.0 3.7 3.6   Recent Labs  06/30/21 0903 10/09/21 1154 09/10/22 0843  WBC 9.0 8.7 8.6  HGB 15.2* 15.6* 16.0*  HCT 47.6* 48.9* 49.1*  MCV 81.9 80.8 82.9  PLT 313 343 280   Recent Labs  04/25/20 0939 06/30/21 0903 03/16/22 1232 09/10/22 0843  TSH 1.763 2.358 -- 2.261  HGBA1C 5.8* 6.1* 6.3* 6.1*    Assessment   67 y.o. female with  Encounter Diagnoses  Name Primary?  History of hypertension Yes  Mixed hyperlipidemia  Moderate mitral insufficiency  Left atrial mass   ECHO 03/01/2023: NORMAL LEFT VENTRICULAR SYSTOLIC FUNCTION NORMAL RIGHT VENTRICULAR SYSTOLIC FUNCTION TRIVIAL REGURGITATION NOTED (See above) NO VALVULAR STENOSIS GLS 17.6% Hyperechoic small mass attached to the mid LA wall - appears separate from the wall structure  Plan   Orders Placed This Encounter  Procedures  ECG 12-lead   Continue current cardiac medications. Encourage low-sodium diet, less than 2000 mg daily. Will schedule TEE to evaluate left atrial mass and moderate MR. EKG today is negative for ischemia. SOB possibly due to MR however could be pulmonary  Return in about 6 weeks (around 05/04/2023).  Attestation Statement:    I personally performed the service, non-incident to. (WP)   Anniebelle Devore, DO

## 2023-03-26 NOTE — Anesthesia Postprocedure Evaluation (Signed)
Anesthesia Post Note  Patient: Erika Schmidt  Procedure(s) Performed: TRANSESOPHAGEAL ECHOCARDIOGRAM (TEE)  Patient location during evaluation: PACU Anesthesia Type: General Level of consciousness: awake and alert, oriented and patient cooperative Pain management: pain level controlled Vital Signs Assessment: post-procedure vital signs reviewed and stable Respiratory status: spontaneous breathing, nonlabored ventilation and respiratory function stable Cardiovascular status: blood pressure returned to baseline and stable Postop Assessment: adequate PO intake Anesthetic complications: no   No notable events documented.   Last Vitals:  Vitals:   03/26/23 1245 03/26/23 1300  BP: 107/68 118/61  Pulse: 78 73  Resp: 15 (!) 22  Temp:    SpO2: 100% 92%    Last Pain:  Vitals:   03/26/23 1300  TempSrc:   PainSc: 0-No pain                 Reed Breech

## 2023-03-30 LAB — ECHO TEE

## 2023-03-31 ENCOUNTER — Encounter: Payer: Self-pay | Admitting: Internal Medicine

## 2023-08-02 ENCOUNTER — Other Ambulatory Visit: Payer: Self-pay | Admitting: Nephrology

## 2023-08-02 DIAGNOSIS — R809 Proteinuria, unspecified: Secondary | ICD-10-CM

## 2023-08-02 DIAGNOSIS — R8290 Unspecified abnormal findings in urine: Secondary | ICD-10-CM

## 2023-08-27 ENCOUNTER — Ambulatory Visit
Admission: RE | Admit: 2023-08-27 | Discharge: 2023-08-27 | Disposition: A | Discharge status: Home / Self Care | Payer: Medicare HMO | Source: Ambulatory Visit | Attending: Nephrology | Admitting: Nephrology

## 2023-08-27 DIAGNOSIS — R8290 Unspecified abnormal findings in urine: Secondary | ICD-10-CM | POA: Diagnosis present

## 2023-08-27 DIAGNOSIS — R809 Proteinuria, unspecified: Secondary | ICD-10-CM | POA: Diagnosis present

## 2023-10-11 ENCOUNTER — Other Ambulatory Visit: Payer: Self-pay | Admitting: Internal Medicine

## 2023-10-11 DIAGNOSIS — Z1231 Encounter for screening mammogram for malignant neoplasm of breast: Secondary | ICD-10-CM

## 2023-11-01 ENCOUNTER — Encounter

## 2023-11-16 ENCOUNTER — Ambulatory Visit
Admission: RE | Admit: 2023-11-16 | Discharge: 2023-11-16 | Disposition: A | Discharge status: Home / Self Care | Source: Ambulatory Visit | Attending: Internal Medicine | Admitting: Internal Medicine

## 2023-11-16 DIAGNOSIS — Z1231 Encounter for screening mammogram for malignant neoplasm of breast: Secondary | ICD-10-CM | POA: Insufficient documentation

## 2023-11-22 ENCOUNTER — Other Ambulatory Visit: Payer: Self-pay | Admitting: Internal Medicine

## 2023-11-22 DIAGNOSIS — R928 Other abnormal and inconclusive findings on diagnostic imaging of breast: Secondary | ICD-10-CM

## 2023-11-24 ENCOUNTER — Ambulatory Visit
Admission: RE | Admit: 2023-11-24 | Discharge: 2023-11-24 | Disposition: A | Discharge status: Home / Self Care | Source: Ambulatory Visit | Attending: Internal Medicine | Admitting: Internal Medicine

## 2023-11-24 DIAGNOSIS — R928 Other abnormal and inconclusive findings on diagnostic imaging of breast: Secondary | ICD-10-CM | POA: Insufficient documentation

## 2023-11-29 ENCOUNTER — Other Ambulatory Visit: Payer: Self-pay | Admitting: Internal Medicine

## 2023-11-29 DIAGNOSIS — R928 Other abnormal and inconclusive findings on diagnostic imaging of breast: Secondary | ICD-10-CM

## 2023-12-01 ENCOUNTER — Ambulatory Visit
Admission: RE | Admit: 2023-12-01 | Discharge: 2023-12-01 | Discharge status: Home / Self Care | Source: Ambulatory Visit | Attending: Internal Medicine | Admitting: Internal Medicine

## 2023-12-01 ENCOUNTER — Ambulatory Visit
Admission: RE | Admit: 2023-12-01 | Discharge: 2023-12-01 | Disposition: A | Discharge status: Home / Self Care | Source: Ambulatory Visit | Attending: Internal Medicine | Admitting: Internal Medicine

## 2023-12-01 DIAGNOSIS — R928 Other abnormal and inconclusive findings on diagnostic imaging of breast: Secondary | ICD-10-CM | POA: Insufficient documentation

## 2023-12-01 DIAGNOSIS — N6489 Other specified disorders of breast: Secondary | ICD-10-CM | POA: Insufficient documentation

## 2023-12-01 HISTORY — PX: BREAST BIOPSY: SHX20

## 2023-12-01 MED ORDER — LIDOCAINE-EPINEPHRINE 1 %-1:100000 IJ SOLN
8.0000 mL | Freq: Once | INTRAMUSCULAR | Status: AC
  Administered 2023-12-01: 8 mL
  Filled 2023-12-01: qty 8

## 2023-12-01 MED ORDER — LIDOCAINE 1 % OPTIME INJ - NO CHARGE
5.0000 mL | Freq: Once | INTRAMUSCULAR | Status: AC
  Administered 2023-12-01: 5 mL
  Filled 2023-12-01: qty 6

## 2023-12-01 MED ORDER — LIDOCAINE HCL (PF) 1 % IJ SOLN
5.0000 mL | Freq: Once | INTRAMUSCULAR | Status: AC
  Administered 2023-12-01: 5 mL
  Filled 2023-12-01: qty 5

## 2023-12-01 MED ORDER — LIDOCAINE-EPINEPHRINE 1 %-1:100000 IJ SOLN
10.0000 mL | Freq: Once | INTRAMUSCULAR | Status: AC
Start: 2023-12-01 — End: 2023-12-01
  Administered 2023-12-01: 10 mL
  Filled 2023-12-01: qty 10

## 2023-12-01 MED ORDER — LIDOCAINE 1 % OPTIME INJ - NO CHARGE
2.0000 mL | Freq: Once | INTRAMUSCULAR | Status: AC
  Administered 2023-12-01: 2 mL
  Filled 2023-12-01: qty 2

## 2023-12-01 MED ORDER — LIDOCAINE-EPINEPHRINE 1 %-1:100000 IJ SOLN
10.0000 mL | Freq: Once | INTRAMUSCULAR | Status: AC
  Administered 2023-12-01: 10 mL
  Filled 2023-12-01: qty 10

## 2023-12-02 LAB — SURGICAL PATHOLOGY

## 2023-12-03 ENCOUNTER — Encounter: Payer: Self-pay | Admitting: *Deleted

## 2023-12-03 DIAGNOSIS — D0512 Intraductal carcinoma in situ of left breast: Secondary | ICD-10-CM

## 2023-12-03 NOTE — Progress Notes (Signed)
 Received referral for newly diagnosed breast cancer from [facility].  Navigation initiated. She will see Dr. Rosea Conch on Monday at 8:15 and Dr.  Randy Buttery at 10:45.

## 2023-12-06 ENCOUNTER — Inpatient Hospital Stay: Attending: Oncology | Admitting: Oncology

## 2023-12-06 ENCOUNTER — Encounter: Payer: Self-pay | Admitting: Oncology

## 2023-12-06 ENCOUNTER — Inpatient Hospital Stay

## 2023-12-06 ENCOUNTER — Ambulatory Visit: Payer: Self-pay | Admitting: Surgery

## 2023-12-06 ENCOUNTER — Telehealth: Payer: Self-pay | Admitting: *Deleted

## 2023-12-06 VITALS — BP 118/57 | HR 97 | Temp 98.9°F | Resp 18 | Ht 63.0 in | Wt 189.8 lb

## 2023-12-06 DIAGNOSIS — Z7189 Other specified counseling: Secondary | ICD-10-CM

## 2023-12-06 DIAGNOSIS — I110 Hypertensive heart disease with heart failure: Secondary | ICD-10-CM

## 2023-12-06 DIAGNOSIS — D0512 Intraductal carcinoma in situ of left breast: Secondary | ICD-10-CM

## 2023-12-06 DIAGNOSIS — I509 Heart failure, unspecified: Secondary | ICD-10-CM

## 2023-12-06 DIAGNOSIS — Z170 Estrogen receptor positive status [ER+]: Secondary | ICD-10-CM

## 2023-12-06 NOTE — Progress Notes (Signed)
 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 Primus Brookes  Date of visit: 12/06/23   History of presenting illness- Patient is a 68 year old female who underwent a routine screening mammogram in April 2025 which showed calcifications in the left breast.  This was followed by diagnostic mammogram and an ultrasound which showed calcifications spanning 9 cm in the AP dimension and 5.3 cm in the transverse dimensions.  There were 2 hypoechoic masses noted at the 1 o'clock position of the left breast 13 cm and 15 cm from the nipple measuring 1.2 x 1 x 0.8 and 0.9 x 0.8 x 0.8 cm respectively.  Left axillary ultrasound showed benign lymph nodes.  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  Tumor was ER positive.  Patient has met with Dr. Rosea Conch who plans to perform left lumpectomy on 12/30/2023.  Patient is G2, P2.  She has 2 sons.  Menarche at the age of 33.  History of breast cancer in both paternal and maternal first cousins.  ECOG PS- 1  Pain scale- 0   Review of systems- Review of Systems  Constitutional:  Negative for chills, fever, malaise/fatigue and weight loss.  HENT:  Negative for congestion, ear discharge and nosebleeds.   Eyes:  Negative for blurred vision.  Respiratory:  Negative for cough, hemoptysis, sputum production, shortness of breath and wheezing.   Cardiovascular:  Negative for chest pain, palpitations, orthopnea and claudication.  Gastrointestinal:  Negative for abdominal pain, blood in stool,  constipation, diarrhea, heartburn, melena, nausea and vomiting.  Genitourinary:  Negative for dysuria, flank pain, frequency, hematuria and urgency.  Musculoskeletal:  Negative for back pain, joint pain and myalgias.  Skin:  Negative for rash.  Neurological:  Negative for dizziness, tingling, focal weakness, seizures, weakness and headaches.  Endo/Heme/Allergies:  Does not bruise/bleed easily.  Psychiatric/Behavioral:  Negative for depression and suicidal ideas. The patient does not have insomnia.     Allergies  Allergen Reactions   Augmentin [Amoxicillin-Pot Clavulanate] Diarrhea   Floxin [Ofloxacin]     Patient Active Problem List   Diagnosis Date Noted   Pneumonia due to COVID-19 virus 06/22/2019   COVID-19 virus infection 06/22/2019   Benign essential hypertension 03/05/2015   Mixed hyperlipidemia 03/05/2015   Moderate mitral insufficiency 03/05/2015   History of hypertension 02/05/2015     Past Medical History:  Diagnosis Date   Breast cancer (HCC)    CHF (congestive heart failure) (HCC)    Hypertension      Past Surgical History:  Procedure Laterality Date   ABDOMINAL HYSTERECTOMY     BREAST BIOPSY Left 12/01/2023   MM LT BREAST BX W LOC DEV 1ST LESION IMAGE BX SPEC STEREO GUIDE 12/01/2023 [facility]   BREAST BIOPSY Left 12/01/2023   MM LT BREAST BX W LOC DEV EA AD LESION IMG BX SPEC STEREO GUIDE 12/01/2023 [facility]   BREAST BIOPSY Left 12/01/2023   US  LT BREAST BX W LOC DEV 1ST LESION IMG BX SPEC US  GUIDE 12/01/2023 [facility]   BREAST BIOPSY Left 12/01/2023   US  LT BREAST BX W LOC DEV EA ADD LESION IMG BX SPEC US   GUIDE 12/01/2023 [facility]   COLONOSCOPY WITH PROPOFOL  N/A 03/25/2015   Procedure: COLONOSCOPY WITH PROPOFOL ;  Surgeon: Deveron Fly, MD;  Location: [facility] ENDOSCOPY;  Service: Endoscopy;  Laterality: N/A;   TEE WITHOUT CARDIOVERSION N/A 03/26/2023   Procedure: TRANSESOPHAGEAL ECHOCARDIOGRAM (TEE);  Surgeon: Isabell Manzanilla, DO;   Location: [facility] ORS;  Service: Cardiovascular;  Laterality: N/A;    Social History   Socioeconomic History   Marital status: Married    Spouse name: Not on file   Number of children: 2   Years of education: Not on file   Highest education level: Not on file  Occupational History   Not on file  Tobacco Use   Smoking status: Never   Smokeless tobacco: Never  Vaping Use   Vaping status: Never Used  Substance and Sexual Activity   Alcohol use: Not Currently    Comment: Occasionally   Drug use: Never   Sexual activity: Not Currently  Other Topics Concern   Not on file  Social History Narrative   Patient has 7 grandchildren & 3 great-grandchildren!    Social Drivers of Health   Financial Resource Strain: Medium Risk (09/22/2023)   Received [facility]   Overall Financial Resource Strain (CARDIA)    Difficulty of Paying Living Expenses: Somewhat hard  Food Insecurity: No Food Insecurity (12/06/2023)   Hunger Vital Sign    Worried About Running Out of Food in the Last Year: Never true    Ran Out of Food in the Last Year: Never true  Transportation Needs: No Transportation Needs (12/06/2023)   PRAPARE - Administrator, Civil Service (Medical): No    Lack of Transportation (Non-Medical): No  Physical Activity: Not on file  Stress: Not on file  Social Connections: Not on file  Intimate Partner Violence: Not At Risk (12/06/2023)   Humiliation, Afraid, Rape, and Kick questionnaire    Fear of Current or Ex-Partner: No    Emotionally Abused: No    Physically Abused: No    Sexually Abused: No     Family History  Problem Relation Age of Onset   Stroke Father    Congestive Heart Failure Sister    Graves' disease Sister    Prostate cancer Brother    Heart attack Brother    Heart attack Brother    Aneurysm Paternal Grandmother    Breast cancer Neg Hx      Current Outpatient Medications:    diltiazem (CARDIZEM) 120 MG tablet, Take 120 mg by  mouth., Disp: , Rfl:    metFORMIN (GLUCOPHAGE-XR) 500 MG 24 hr tablet, Take 500 mg by mouth., Disp: , Rfl:    acetaminophen  (TYLENOL ) 500 MG tablet, Take 500 mg by mouth., Disp: , Rfl:    albuterol  (VENTOLIN  HFA) 108 (90 Base) MCG/ACT inhaler, Inhale 2 puffs into the lungs every 6 (six) hours as needed for wheezing or shortness of breath., Disp: 6.7 g, Rfl: 0   apixaban  (ELIQUIS ) 2.5 MG TABS tablet, Take 1 tablet (2.5 mg total) by mouth 2 (two) times daily. (Patient not taking: Reported on 03/26/2023), Disp: 30 tablet, Rfl: 0   aspirin  81 MG tablet, Take 1 tablet (81 mg total) by mouth daily., Disp: 30 tablet, Rfl:    atorvastatin  (LIPITOR) 40 MG tablet, Take 40 mg by mouth daily., Disp: , Rfl:    Cholecalciferol 50 MCG (2000 UT) TABS, Take 2,000 Units by mouth., Disp: , Rfl:    furosemide  (LASIX ) 20 MG tablet, Take 20 mg  by mouth daily. , Disp: , Rfl:    lisinopril (PRINIVIL,ZESTRIL) 5 MG tablet, Take 5 mg by mouth daily. (Patient not taking: Reported on 03/26/2023), Disp: , Rfl:    losartan (COZAAR) 50 MG tablet, Take 50 mg by mouth daily., Disp: , Rfl:    metoprolol  succinate (TOPROL -XL) 25 MG 24 hr tablet, Take 25 mg by mouth daily. (Patient not taking: Reported on 03/26/2023), Disp: , Rfl:    potassium chloride (K-DUR,KLOR-CON) 10 MEQ tablet, Take 20 mEq by mouth daily. (Patient not taking: Reported on 03/26/2023), Disp: , Rfl:    predniSONE  (DELTASONE ) 5 MG tablet, Label  & dispense according to the schedule below. take 8 Pills PO for 3 days, 6 Pills PO for 3 days, 4 Pills PO for 3 days, 2 Pills PO for 3 days, 1 Pills PO for 3 days, 1/2 Pill  PO for 3 days then STOP. Total 65 pills., Disp: 65 tablet, Rfl: 0   Physical exam:  Vitals:   12/06/23 1106  BP: (!) 118/57  Pulse: 97  Resp: 18  Temp: 98.9 F (37.2 C)  TempSrc: Tympanic  SpO2: 99%  Weight: 189 lb 12.8 oz (86.1 kg)  Height: 5\' 3"  (1.6 m)   Physical Exam Cardiovascular:     Rate and Rhythm: Normal rate and regular rhythm.      Heart sounds: Normal heart sounds.  Pulmonary:     Effort: Pulmonary effort is normal.     Breath sounds: Normal breath sounds.  Abdominal:     General: Bowel sounds are normal.     Palpations: Abdomen is soft.  Skin:    General: Skin is warm and dry.  Neurological:     Mental Status: She is alert and oriented to person, place, and time.   Breast exam: No palpable masses in either breast.  Induration at the site of biopsy.  No palpable bilateral axillary adenopathy.       Latest Ref Rng & Units 10/07/2021    2:32 AM  CMP  Glucose 70 - 99 mg/dL 161   BUN 8 - 23 mg/dL 12   Creatinine 0.96 - 1.00 mg/dL 0.45   Sodium 409 - 811 mmol/L 136   Potassium 3.5 - 5.1 mmol/L 4.2   Chloride 98 - 111 mmol/L 104   CO2 22 - 32 mmol/L 26   Calcium  8.9 - 10.3 mg/dL 8.9   Total Protein 6.5 - 8.1 g/dL 8.0   Total Bilirubin 0.3 - 1.2 mg/dL 2.9   Alkaline Phos 38 - 126 U/L 146   AST 15 - 41 U/L 127   ALT 0 - 44 U/L 61       Latest Ref Rng & Units 10/07/2021    2:32 AM  CBC  WBC 4.0 - 10.5 K/uL 12.6   Hemoglobin 12.0 - 15.0 g/dL 91.4   Hematocrit 78.2 - 46.0 % 47.5   Platelets 150 - 400 K/uL 287     No images are attached to the encounter.  MM LT BREAST BX W LOC DEV 1ST LESION IMAGE BX SPEC STEREO GUIDE Addendum Date: 12/06/2023 ADDENDUM REPORT: 12/06/2023 09:07 ADDENDUM: PATHOLOGY revealed: Site 1. Breast, left, needle core biopsy, 1 o'clock, 13 cmfn, heart clip : - DUCTAL CARCINOMA IN SITU (DCIS), INTERMEDIATE GRADE, AND INVOLVING AN INTRADUCTAL PAPILLOMA WITH ASSOCIATED CALCIFICATIONS AND SCLEROSIS. Pathology results are CONCORDANT with imaging findings, per Dr. Mercie Stalker PATHOLOGY revealed: Site 2. Breast, left, needle core biopsy, 1 o'clock, 15 cmfn, coil clip : - DUCTAL CARCINOMA IN  SITU (DCIS), INTERMEDIATE GRADE. Pathology results are CONCORDANT with imaging findings, per Dr. Mercie Stalker. PATHOLOGY revealed: Site 3. Breast, left, needle core biopsy, upper outer calcifications, x clip -  FIBROADENOMATOID CHANGE WITH ASSOCIATED COARSE CALCIFICATIONS - NEGATIVE FOR ATYPIA AND MALIGNANCY. Pathology results are CONCORDANT with imaging findings, per Dr. Mercie Stalker. PATHOLOGY revealed: Site 4. Breast, left, needle core biopsy, central upper calcifications, ribbon clip : - DUCTAL CARCINOMA IN SITU (DCIS), INTERMEDIATE GRADE, AND FOCALLY INVOLVING AN INTRADUCTAL PAPILLOMA WITH ASSOCIATED CALCIFICATIONS. Pathology results are CONCORDANT with imaging findings, per Dr. Mercie Stalker. Findings are consistent with multifocal DCIS. The distance between the anterior extent marked by the ribbon clip and posterior extent marked by the coil clip spans 6 cm AP dimension. Pathology results and recommendations were discussed with patient via telephone on 12/02/23 by Ladonna Pickup RN. Patient reported biopsy site doing well with no adverse symptoms, and only slight tenderness at the site. Post biopsy care instructions were reviewed, questions were answered and my direct phone number was provided. Patient was instructed to call [facility] for any additional questions or concerns related to biopsy site. RECOMMENDATIONS: 1. Surgical and oncological consultation for sites 1, 2 and 4. Request for surgical and oncological consultation relayed to Gwenn Lenz RN at [facility] by Ladonna Pickup RN on 12/02/23. 2. Consider pretreatment bilateral breast MRI with and without contrast to assess extent of breast disease. Pathology results reported by Ladonna Pickup RN on 12/02/2023. Electronically Signed   By: Mercie Stalker M.D.   On: 12/06/2023 09:07   Result Date: 12/06/2023 CLINICAL DATA:  Suspicious calcifications in upper central and upper outer left breast middle depth. EXAM: LEFT BREAST STEREOTACTIC CORE NEEDLE BIOPSY COMPARISON:  Previous exam(s). FINDINGS: The patient and I discussed the procedure of stereotactic-guided biopsy including benefits and alternatives. We discussed the high likelihood of a  successful procedure. We discussed the risks of the procedure including infection, bleeding, tissue injury, clip migration, and inadequate sampling. Informed written consent was given. The usual time out protocol was performed immediately prior to the procedure. Site 1: Lesion quadrant: Upper outer, X clip Using sterile technique and 1% lidocaine  as local anesthetic, under stereotactic guidance, a 9 gauge vacuum assisted device was used to perform core needle biopsy of calcifications in the upper outer left breast at middle depth using a superior approach. Specimen radiograph was performed showing representative calcifications in 2 of the 5 samples. Specimens with calcifications are identified for pathology. At the conclusion of the procedure, an X shaped tissue marker clip was deployed into the biopsy cavity. Site 2: Lesion quadrant: Upper central, ribbon clip Using sterile technique and 1% lidocaine  as local anesthetic, under stereotactic guidance, a 9 gauge vacuum assisted device was used to perform core needle biopsy of calcifications in the upper central left breast at middle depth using a superior approach. Specimen radiograph was performed showing representative calcifications in 5 of the 6 samples. Specimens with calcifications are identified for pathology. At the conclusion of the procedure, a ribbon shaped tissue marker clip was deployed into the biopsy cavity. Follow-up 2-view mammogram was performed and dictated separately. IMPRESSION: Stereotactic-guided biopsy of calcifications in the upper outer and upper central left breast at middle depth. A small postprocedure hematoma was noted at the biopsy site in the upper central left breast. Conservative management of the hematoma was discussed with the patient. Electronically Signed: By: Mercie Stalker M.D. On: 12/01/2023 10:52   MM LT BREAST BX W LOC DEV EA  AD LESION IMG BX SPEC STEREO GUIDE Addendum Date: 12/06/2023 ADDENDUM REPORT: 12/06/2023 09:07  ADDENDUM: PATHOLOGY revealed: Site 1. Breast, left, needle core biopsy, 1 o'clock, 13 cmfn, heart clip : - DUCTAL CARCINOMA IN SITU (DCIS), INTERMEDIATE GRADE, AND INVOLVING AN INTRADUCTAL PAPILLOMA WITH ASSOCIATED CALCIFICATIONS AND SCLEROSIS. Pathology results are CONCORDANT with imaging findings, per Dr. Mercie Stalker PATHOLOGY revealed: Site 2. Breast, left, needle core biopsy, 1 o'clock, 15 cmfn, coil clip : - DUCTAL CARCINOMA IN SITU (DCIS), INTERMEDIATE GRADE. Pathology results are CONCORDANT with imaging findings, per Dr. Mercie Stalker. PATHOLOGY revealed: Site 3. Breast, left, needle core biopsy, upper outer calcifications, x clip - FIBROADENOMATOID CHANGE WITH ASSOCIATED COARSE CALCIFICATIONS - NEGATIVE FOR ATYPIA AND MALIGNANCY. Pathology results are CONCORDANT with imaging findings, per Dr. Mercie Stalker. PATHOLOGY revealed: Site 4. Breast, left, needle core biopsy, central upper calcifications, ribbon clip : - DUCTAL CARCINOMA IN SITU (DCIS), INTERMEDIATE GRADE, AND FOCALLY INVOLVING AN INTRADUCTAL PAPILLOMA WITH ASSOCIATED CALCIFICATIONS. Pathology results are CONCORDANT with imaging findings, per Dr. Mercie Stalker. Findings are consistent with multifocal DCIS. The distance between the anterior extent marked by the ribbon clip and posterior extent marked by the coil clip spans 6 cm AP dimension. Pathology results and recommendations were discussed with patient via telephone on 12/02/23 by Ladonna Pickup RN. Patient reported biopsy site doing well with no adverse symptoms, and only slight tenderness at the site. Post biopsy care instructions were reviewed, questions were answered and my direct phone number was provided. Patient was instructed to call [facility] for any additional questions or concerns related to biopsy site. RECOMMENDATIONS: 1. Surgical and oncological consultation for sites 1, 2 and 4. Request for surgical and oncological consultation relayed to Gwenn Lenz RN at [facility] by Ladonna Pickup RN on 12/02/23. 2. Consider pretreatment bilateral breast MRI with and without contrast to assess extent of breast disease. Pathology results reported by Ladonna Pickup RN on 12/02/2023. Electronically Signed   By: Mercie Stalker M.D.   On: 12/06/2023 09:07   Result Date: 12/06/2023 CLINICAL DATA:  Suspicious calcifications in upper central and upper outer left breast middle depth. EXAM: LEFT BREAST STEREOTACTIC CORE NEEDLE BIOPSY COMPARISON:  Previous exam(s). FINDINGS: The patient and I discussed the procedure of stereotactic-guided biopsy including benefits and alternatives. We discussed the high likelihood of a successful procedure. We discussed the risks of the procedure including infection, bleeding, tissue injury, clip migration, and inadequate sampling. Informed written consent was given. The usual time out protocol was performed immediately prior to the procedure. Site 1: Lesion quadrant: Upper outer, X clip Using sterile technique and 1% lidocaine  as local anesthetic, under stereotactic guidance, a 9 gauge vacuum assisted device was used to perform core needle biopsy of calcifications in the upper outer left breast at middle depth using a superior approach. Specimen radiograph was performed showing representative calcifications in 2 of the 5 samples. Specimens with calcifications are identified for pathology. At the conclusion of the procedure, an X shaped tissue marker clip was deployed into the biopsy cavity. Site 2: Lesion quadrant: Upper central, ribbon clip Using sterile technique and 1% lidocaine  as local anesthetic, under stereotactic guidance, a 9 gauge vacuum assisted device was used to perform core needle biopsy of calcifications in the upper central left breast at middle depth using a superior approach. Specimen radiograph was performed showing representative calcifications in 5 of the 6 samples. Specimens with calcifications are identified for pathology. At the conclusion of the  procedure, a ribbon  shaped tissue marker clip was deployed into the biopsy cavity. Follow-up 2-view mammogram was performed and dictated separately. IMPRESSION: Stereotactic-guided biopsy of calcifications in the upper outer and upper central left breast at middle depth. A small postprocedure hematoma was noted at the biopsy site in the upper central left breast. Conservative management of the hematoma was discussed with the patient. Electronically Signed: By: Mercie Stalker M.D. On: 12/01/2023 10:52   US  LT BREAST BX W LOC DEV EA ADD LESION IMG BX SPEC US  GUIDE Addendum Date: 12/06/2023 ADDENDUM REPORT: 12/06/2023 09:07 ADDENDUM: PATHOLOGY revealed: Site 1. Breast, left, needle core biopsy, 1 o'clock, 13 cmfn, heart clip : - DUCTAL CARCINOMA IN SITU (DCIS), INTERMEDIATE GRADE, AND INVOLVING AN INTRADUCTAL PAPILLOMA WITH ASSOCIATED CALCIFICATIONS AND SCLEROSIS. Pathology results are CONCORDANT with imaging findings, per Dr. Mercie Stalker PATHOLOGY revealed: Site 2. Breast, left, needle core biopsy, 1 o'clock, 15 cmfn, coil clip : - DUCTAL CARCINOMA IN SITU (DCIS), INTERMEDIATE GRADE. Pathology results are CONCORDANT with imaging findings, per Dr. Mercie Stalker. PATHOLOGY revealed: Site 3. Breast, left, needle core biopsy, upper outer calcifications, x clip - FIBROADENOMATOID CHANGE WITH ASSOCIATED COARSE CALCIFICATIONS - NEGATIVE FOR ATYPIA AND MALIGNANCY. Pathology results are CONCORDANT with imaging findings, per Dr. Mercie Stalker. PATHOLOGY revealed: Site 4. Breast, left, needle core biopsy, central upper calcifications, ribbon clip : - DUCTAL CARCINOMA IN SITU (DCIS), INTERMEDIATE GRADE, AND FOCALLY INVOLVING AN INTRADUCTAL PAPILLOMA WITH ASSOCIATED CALCIFICATIONS. Pathology results are CONCORDANT with imaging findings, per Dr. Mercie Stalker. Findings are consistent with multifocal DCIS. The distance between the anterior extent marked by the ribbon clip and posterior extent marked by the coil clip spans 6 cm AP  dimension. Pathology results and recommendations were discussed with patient via telephone on 12/02/23 by Ladonna Pickup RN. Patient reported biopsy site doing well with no adverse symptoms, and only slight tenderness at the site. Post biopsy care instructions were reviewed, questions were answered and my direct phone number was provided. Patient was instructed to call [facility] for any additional questions or concerns related to biopsy site. RECOMMENDATIONS: 1. Surgical and oncological consultation for sites 1, 2 and 4. Request for surgical and oncological consultation relayed to Gwenn Lenz RN at [facility] by Ladonna Pickup RN on 12/02/23. 2. Consider pretreatment bilateral breast MRI with and without contrast to assess extent of breast disease. Pathology results reported by Ladonna Pickup RN on 12/02/2023. Electronically Signed   By: Mercie Stalker M.D.   On: 12/06/2023 09:07   Result Date: 12/06/2023 CLINICAL DATA:  Two suspicious masses at the left breast 1 o'clock position 13 cm and 15 cm from the nipple. EXAM: ULTRASOUND GUIDED LEFT BREAST CORE NEEDLE BIOPSY COMPARISON:  Previous exam(s). PROCEDURE: I met with the patient and we discussed the procedure of ultrasound-guided biopsy, including benefits and alternatives. We discussed the high likelihood of a successful procedure. We discussed the risks of the procedure, including infection, bleeding, tissue injury, clip migration, and inadequate sampling. Informed written consent was given. The usual time-out protocol was performed immediately prior to the procedure. SITE 1: 1 o'clock position 13 cm from the nipple, heart clip Lesion quadrant: Upper outer quadrant Using sterile technique and 1% Lidocaine  as local anesthetic, under direct ultrasound visualization, a 12 gauge spring-loaded device was used to perform biopsy of a 1.2 cm mass at the left breast 1 o'clock position 13 cm from the nipple using a lateral approach. At the conclusion of  the procedure a heart shaped tissue marker clip  was deployed into the biopsy cavity. SITE 2: 1 o'clock position 15 cm from the nipple, coil clip Lesion quadrant: Upper outer quadrant Using sterile technique and 1% Lidocaine  as local anesthetic, under direct ultrasound visualization, a 12 gauge spring-loaded device was used to perform biopsy of a 0.9 cm mass at the left breast 1 o'clock position 15 cm from the nipple using an inferolateral approach. At the conclusion of the procedure a coil shaped tissue marker clip was deployed into the biopsy cavity. Follow up 2 view mammogram was performed and dictated separately. IMPRESSION: Ultrasound guided biopsy of a 1.2 cm mass at the left breast 1 o'clock position 13 cm from the nipple (heart clip) and a 0.9 cm mass at the left breast 1 o'clock position 15 cm from the nipple (coil clip). No apparent complications. Electronically Signed: By: Mercie Stalker M.D. On: 12/01/2023 11:25   US  LT BREAST BX W LOC DEV 1ST LESION IMG BX SPEC US  GUIDE Addendum Date: 12/06/2023 ADDENDUM REPORT: 12/06/2023 09:07 ADDENDUM: PATHOLOGY revealed: Site 1. Breast, left, needle core biopsy, 1 o'clock, 13 cmfn, heart clip : - DUCTAL CARCINOMA IN SITU (DCIS), INTERMEDIATE GRADE, AND INVOLVING AN INTRADUCTAL PAPILLOMA WITH ASSOCIATED CALCIFICATIONS AND SCLEROSIS. Pathology results are CONCORDANT with imaging findings, per Dr. Mercie Stalker PATHOLOGY revealed: Site 2. Breast, left, needle core biopsy, 1 o'clock, 15 cmfn, coil clip : - DUCTAL CARCINOMA IN SITU (DCIS), INTERMEDIATE GRADE. Pathology results are CONCORDANT with imaging findings, per Dr. Mercie Stalker. PATHOLOGY revealed: Site 3. Breast, left, needle core biopsy, upper outer calcifications, x clip - FIBROADENOMATOID CHANGE WITH ASSOCIATED COARSE CALCIFICATIONS - NEGATIVE FOR ATYPIA AND MALIGNANCY. Pathology results are CONCORDANT with imaging findings, per Dr. Mercie Stalker. PATHOLOGY revealed: Site 4. Breast, left, needle core biopsy,  central upper calcifications, ribbon clip : - DUCTAL CARCINOMA IN SITU (DCIS), INTERMEDIATE GRADE, AND FOCALLY INVOLVING AN INTRADUCTAL PAPILLOMA WITH ASSOCIATED CALCIFICATIONS. Pathology results are CONCORDANT with imaging findings, per Dr. Mercie Stalker. Findings are consistent with multifocal DCIS. The distance between the anterior extent marked by the ribbon clip and posterior extent marked by the coil clip spans 6 cm AP dimension. Pathology results and recommendations were discussed with patient via telephone on 12/02/23 by Ladonna Pickup RN. Patient reported biopsy site doing well with no adverse symptoms, and only slight tenderness at the site. Post biopsy care instructions were reviewed, questions were answered and my direct phone number was provided. Patient was instructed to call [facility] for any additional questions or concerns related to biopsy site. RECOMMENDATIONS: 1. Surgical and oncological consultation for sites 1, 2 and 4. Request for surgical and oncological consultation relayed to Gwenn Lenz RN at [facility] by Ladonna Pickup RN on 12/02/23. 2. Consider pretreatment bilateral breast MRI with and without contrast to assess extent of breast disease. Pathology results reported by Ladonna Pickup RN on 12/02/2023. Electronically Signed   By: Mercie Stalker M.D.   On: 12/06/2023 09:07   Result Date: 12/06/2023 CLINICAL DATA:  Two suspicious masses at the left breast 1 o'clock position 13 cm and 15 cm from the nipple. EXAM: ULTRASOUND GUIDED LEFT BREAST CORE NEEDLE BIOPSY COMPARISON:  Previous exam(s). PROCEDURE: I met with the patient and we discussed the procedure of ultrasound-guided biopsy, including benefits and alternatives. We discussed the high likelihood of a successful procedure. We discussed the risks of the procedure, including infection, bleeding, tissue injury, clip migration, and inadequate sampling. Informed written consent was given. The usual time-out protocol was  performed  immediately prior to the procedure. SITE 1: 1 o'clock position 13 cm from the nipple, heart clip Lesion quadrant: Upper outer quadrant Using sterile technique and 1% Lidocaine  as local anesthetic, under direct ultrasound visualization, a 12 gauge spring-loaded device was used to perform biopsy of a 1.2 cm mass at the left breast 1 o'clock position 13 cm from the nipple using a lateral approach. At the conclusion of the procedure a heart shaped tissue marker clip was deployed into the biopsy cavity. SITE 2: 1 o'clock position 15 cm from the nipple, coil clip Lesion quadrant: Upper outer quadrant Using sterile technique and 1% Lidocaine  as local anesthetic, under direct ultrasound visualization, a 12 gauge spring-loaded device was used to perform biopsy of a 0.9 cm mass at the left breast 1 o'clock position 15 cm from the nipple using an inferolateral approach. At the conclusion of the procedure a coil shaped tissue marker clip was deployed into the biopsy cavity. Follow up 2 view mammogram was performed and dictated separately. IMPRESSION: Ultrasound guided biopsy of a 1.2 cm mass at the left breast 1 o'clock position 13 cm from the nipple (heart clip) and a 0.9 cm mass at the left breast 1 o'clock position 15 cm from the nipple (coil clip). No apparent complications. Electronically Signed: By: Mercie Stalker M.D. On: 12/01/2023 11:25   MM CLIP PLACEMENT LEFT Result Date: 12/01/2023 CLINICAL DATA:  Postprocedure mammogram. EXAM: 3D DIAGNOSTIC LEFT MAMMOGRAM POST ULTRASOUND AND STEREOTACTIC BIOPSIES COMPARISON:  Previous exam(s). ACR Breast Density Category b: There are scattered areas of fibroglandular density. FINDINGS: 3D Mammographic images were obtained following ultrasound guided biopsy of masses at the 1 o'clock position 13 cm and 15 cm from the nipple as well as 2 groups of calcifications in the upper outer and upper central left breast at middle depth. The coil shaped biopsy marking clip is in  expected position at the site of biopsy in the upper central left breast at posterior depth, denoting site of ultrasound-guided biopsy at the 1 o'clock position 15 cm from the nipple. This corresponds to the architectural distortion seen on diagnostic mammogram. The heart shaped biopsy marking clip is in expected position at site of biopsy in the upper central slightly outer left breast middle depth, denoting site of ultrasound-guided biopsy at the 1 o'clock position 13 cm from the nipple. The X shaped biopsy marking clip is displaced approximately 1.3 cm slightly posteromedial the biopsy site in the upper outer breast at middle depth. The ribbon shaped biopsy marking clip is in expected position at site of biopsy in the upper central left breast at middle depth. IMPRESSION: 1. Appropriate positioning of the coil shaped biopsy marking clip at the site of biopsy in the upper central left breast at posterior depth. 2. Appropriate positioning of the heart shaped biopsy marking clip at site of biopsy in the upper central to slightly upper outer left breast at middle depth. 3. The X shaped biopsy marking clip is displaced approximately 1.3 cm posteromedial to the biopsy site the upper outer breast at middle depth. 4. Appropriate positioning of the ribbon shaped biopsy marking clip at site of biopsy in the upper central left breast at middle depth. Final Assessment: Post Procedure Mammograms for Marker Placement Electronically Signed   By: Mercie Stalker M.D.   On: 12/01/2023 10:59   MM 3D DIAGNOSTIC MAMMOGRAM UNILATERAL LEFT BREAST Result Date: 11/24/2023 CLINICAL DATA:  Screening recall for possible LEFT breast distortion and possible LEFT breast calcifications EXAM: DIGITAL DIAGNOSTIC UNILATERAL  LEFT MAMMOGRAM WITH TOMOSYNTHESIS AND CAD; ULTRASOUND LEFT BREAST LIMITED TECHNIQUE: Left digital diagnostic mammography and breast tomosynthesis was performed. The images were evaluated with computer-aided detection. ;  Targeted ultrasound examination of the left breast was performed. COMPARISON:  Previous exam(s). ACR Breast Density Category b: There are scattered areas of fibroglandular density. FINDINGS: MAMMOGRAM: There is an area of persistent architectural distortion the upper central left breast posterior depth (spot CC 1 of 3 image 20/53, ML image 42/76). This corresponds with the area of distortion seen on screening mammogram. Spot magnification views demonstrate an area of pleomorphic calcification in a segmental distribution in the upper-outer left breast middle to posterior depth. These calcifications span at least 9.0 cm in the AP dimension and 5.3 cm in the transverse dimension. Notably, these calcifications span anteriorly and posteriorly from the area of architectural distortion described above. No additional suspicious findings are identified in the left breast. ULTRASOUND: Targeted left breast ultrasound was performed: At 1 o'clock 15 cm from nipple, there is an irregular hypoechoic mass that measures 0.9 x 0.8 x 0.8 cm. There is mild internal vascularity. This corresponds with the area of architectural distortion seen on mammogram. There is an additional irregular hypoechoic mass at 1 o'clock 13 cm from nipple. The mass measures 1.2 x 0.8 x 1.0 cm and there is mild internal vascularity. The distance between the 2 masses is approximately 1.7 cm in they overall span approximately 3.8 cm. Targeted left axillary ultrasound demonstrates multiple morphologically benign lymph nodes. IMPRESSION: 1. There are 2 adjacent irregular masses in the LEFT breast 1 o'clock position, concerning for malignancy. Recommend further assessment with ultrasound-guided biopsy of both masses. 2. LEFT breast pleomorphic calcification in the segmental distribution the upper outer quadrant, spanning at least 9.0 cm. These calcifications are in association with the irregular masses described above. Recommend further assessment with  stereotactic guided biopsy. 3. No left axillary lymphadenopathy. RECOMMENDATION: 1. LEFT breast ultrasound-guided biopsy (2 site). 2. LEFT breast stereotactic guided biopsy (1-2 site): Based on the clip placement after the two-site ultrasound guided biopsy, recommend stereotactic guided biopsy of 1-2 distant areas of calcification in the upper-outer left breast, based on performing radiologist discretion. I have discussed the findings and recommendations with the patient. The biopsy procedure was discussed with the patient and questions were answered. Patient expressed their understanding of the biopsy recommendation. Patient will be scheduled for biopsy at her earliest convenience by the schedulers. Ordering provider will be notified. If applicable, a reminder letter will be sent to the patient regarding the next appointment. BI-RADS CATEGORY  4: Suspicious. Electronically Signed   By: Sande Cromer M.D.   On: 11/24/2023 17:09   US  LIMITED ULTRASOUND INCLUDING AXILLA LEFT BREAST  Result Date: 11/24/2023 CLINICAL DATA:  Screening recall for possible LEFT breast distortion and possible LEFT breast calcifications EXAM: DIGITAL DIAGNOSTIC UNILATERAL LEFT MAMMOGRAM WITH TOMOSYNTHESIS AND CAD; ULTRASOUND LEFT BREAST LIMITED TECHNIQUE: Left digital diagnostic mammography and breast tomosynthesis was performed. The images were evaluated with computer-aided detection. ; Targeted ultrasound examination of the left breast was performed. COMPARISON:  Previous exam(s). ACR Breast Density Category b: There are scattered areas of fibroglandular density. FINDINGS: MAMMOGRAM: There is an area of persistent architectural distortion the upper central left breast posterior depth (spot CC 1 of 3 image 20/53, ML image 42/76). This corresponds with the area of distortion seen on screening mammogram. Spot magnification views demonstrate an area of pleomorphic calcification in a segmental distribution in the upper-outer left breast  middle to posterior  depth. These calcifications span at least 9.0 cm in the AP dimension and 5.3 cm in the transverse dimension. Notably, these calcifications span anteriorly and posteriorly from the area of architectural distortion described above. No additional suspicious findings are identified in the left breast. ULTRASOUND: Targeted left breast ultrasound was performed: At 1 o'clock 15 cm from nipple, there is an irregular hypoechoic mass that measures 0.9 x 0.8 x 0.8 cm. There is mild internal vascularity. This corresponds with the area of architectural distortion seen on mammogram. There is an additional irregular hypoechoic mass at 1 o'clock 13 cm from nipple. The mass measures 1.2 x 0.8 x 1.0 cm and there is mild internal vascularity. The distance between the 2 masses is approximately 1.7 cm in they overall span approximately 3.8 cm. Targeted left axillary ultrasound demonstrates multiple morphologically benign lymph nodes. IMPRESSION: 1. There are 2 adjacent irregular masses in the LEFT breast 1 o'clock position, concerning for malignancy. Recommend further assessment with ultrasound-guided biopsy of both masses. 2. LEFT breast pleomorphic calcification in the segmental distribution the upper outer quadrant, spanning at least 9.0 cm. These calcifications are in association with the irregular masses described above. Recommend further assessment with stereotactic guided biopsy. 3. No left axillary lymphadenopathy. RECOMMENDATION: 1. LEFT breast ultrasound-guided biopsy (2 site). 2. LEFT breast stereotactic guided biopsy (1-2 site): Based on the clip placement after the two-site ultrasound guided biopsy, recommend stereotactic guided biopsy of 1-2 distant areas of calcification in the upper-outer left breast, based on performing radiologist discretion. I have discussed the findings and recommendations with the patient. The biopsy procedure was discussed with the patient and questions were answered. Patient  expressed their understanding of the biopsy recommendation. Patient will be scheduled for biopsy at her earliest convenience by the schedulers. Ordering provider will be notified. If applicable, a reminder letter will be sent to the patient regarding the next appointment. BI-RADS CATEGORY  4: Suspicious. Electronically Signed   By: Sande Cromer M.D.   On: 11/24/2023 17:09   MM 3D SCREENING MAMMOGRAM BILATERAL BREAST Result Date: 11/18/2023 CLINICAL DATA:  Screening. EXAM: DIGITAL SCREENING BILATERAL MAMMOGRAM WITH TOMOSYNTHESIS AND CAD TECHNIQUE: Bilateral screening digital craniocaudal and mediolateral oblique mammograms were obtained. Bilateral screening digital breast tomosynthesis was performed. The images were evaluated with computer-aided detection. COMPARISON:  Previous exam(s). ACR Breast Density Category b: There are scattered areas of fibroglandular density. FINDINGS: In the left breast, possible distortion as well as calcifications warrants further evaluation. In the right breast, no findings suspicious for malignancy. IMPRESSION: Further evaluation is suggested for possible distortion as well as calcifications in the left breast. RECOMMENDATION: Diagnostic mammogram and possibly ultrasound of the left breast. (Code:[id number]) The patient will be contacted regarding the findings, and additional imaging will be scheduled. BI-RADS CATEGORY  0: Incomplete: Need additional imaging evaluation. Electronically Signed   By: Alger Infield M.D.   On: 11/18/2023 15:15    Assessment and plan- Patient is a 68 y.o. female with newly diagnosed left breast DCIS ER positive referred for further management  I have discussed mammogram ultrasound and biopsy findings in detail.  Patient noted to have extensive left breast calcifications and 2 hypoechoic masses at the 1 o'clock position 13 and 15 cm in the left breast.  Both these masses and upper outer calcifications were biopsied and was consistent with  intermediate grade DCIS.  Patient has met with Dr. Rosea Conch and plans to undergo upfront lumpectomy.  Mastectomy was not deemed to be necessary as per my discussion with Dr.  Rosea Conch  Discussed that standard of care for DCIS that is ER positive would be upfront lumpectomy followed by consideration for adjuvant radiation.  Ideally we are looking for 2 mm negative margins at the time of DCIS.  If margins are close or positive consideration will need to be given for repeat surgery versus radiation boost.  In rare situations invasive cancer may be found at the time of DCIS.  Purely for DCIS there is no role for adjuvant chemotherapy.  Given that she has ER positive DCIS there would be role for adjuvant endocrine therapy which I will discuss with her in greater detail down the line.  I did discuss genetic testing with the patient as well and she would like to defer it for now  I will see her back in the first week of June 2025 to discuss final pathology results and also have her see radiation oncology at that time    Cancer Staging  Ductal carcinoma in situ (DCIS) of left breast Staging form: Breast, AJCC 8th Edition - Clinical stage from 12/06/2023: Stage 0 (cTis (DCIS), cN0, cM0, G2, ER+, PR: Not Assessed, HER2: Not Assessed) - Signed by Avonne Boettcher, MD on 12/06/2023 Nuclear grade: G2 Histologic grading system: 3 grade system   Thank you for this kind referral and the opportunity to participate in the care of this  Patient   Visit Diagnosis 1. Ductal carcinoma in situ (DCIS) of left breast   2. Goals of care, counseling/discussion     Dr. Seretha Dance, MD, MPH [facility] [phone] 12/06/2023

## 2023-12-06 NOTE — Telephone Encounter (Signed)
 Tori told the patient that when she gets home which meds of losartan  is the right one. She looked at the bottle is losartan , and we will take the losartan -hydrochlorothiazide off of the list. Patient is ok with this.

## 2023-12-06 NOTE — H&P (View-Only) (Signed)
 Subjective:   CC: Ductal carcinoma in situ (DCIS) of left breast [D05.12] HPI:  Erika Schmidt is a 68 y.o. female who was referred by Rex Castor, MD for evaluation of above. Change was noted on last screening mammogram. Patient does not routinely do self breast exams. Age of menarche was 14. Age of menopause was 64 after hysterectomy.  Patient denies hormonal therapy. Patient is G3P2. Age of first live birth was 14. Patient denies nipple discharge. Patient denies previous breast biopsy. Patient denies a personal history of breast cancer.   Past Medical History:  has a past medical history of CHF (congestive heart failure) (CMS/HHS-HCC), Hyperplastic colon polyp (03/25/2015), Hypertension, Mixed hyperlipidemia (03/05/2015), Moderate mitral insufficiency (03/05/2015), Obesity, and Pneumonia (09/2021).  Past Surgical History:  has a past surgical history that includes Colonoscopy (03/25/2015) and Hysterectomy Vaginal.  Family History: family history includes Liver disease in her father; Stroke in her father.  Social History:  reports that she has never smoked. She has never been exposed to tobacco smoke. She has never used smokeless tobacco. She reports current alcohol use of about 3.0 standard drinks of alcohol per week. She reports that she does not use drugs.  Current Medications: has a current medication list which includes the following prescription(s): acetaminophen , albuterol  mdi (proventil , ventolin , proair ) hfa, aspirin , atorvastatin , betamethasone valerate, cholecalciferol, diltiazem, fluticasone propion-salmeterol, furosemide , losartan, mepolizumab, metformin, prednisone , and triamcinolone.  Allergies:  Allergies as of 12/06/2023 - Reviewed 12/06/2023  Allergen Reaction Noted   Augmentin [amoxicillin-pot clavulanate] Diarrhea 03/19/2022   Floxin [ofloxacin] Unknown 12/20/2014    ROS:  A 15 point review of systems was performed and was negative except as noted in HPI    Objective:     BP (!) 143/84   Pulse 89   Ht 160 cm (5\' 3" )   Wt 87.1 kg (192 lb)   BMI 34.01 kg/m   Constitutional :  No distress, cooperative, alert  Lymphatics/Throat:  Supple with no lymphadenopathy  Respiratory:  Clear to auscultation bilaterally  Cardiovascular:  Regular rate and rhythm  Gastrointestinal: Soft, non-tender, non-distended, no organomegaly.  Musculoskeletal: Steady gait and movement  Skin: Cool and moist  Psychiatric: Normal affect, non-agitated, not confused  Breast: Normal appearance and no palpable abnormality in right breast and axilla.  Chaperone present for exam. Left breast with hematoma from biopsy, no obvious mass or axillary lymphadenopathy.      LABS:  Component 5 d ago  SURGICAL PATHOLOGY SURGICAL PATHOLOGY [facility] [address], [address] [place], [place] [address] Telephone [phone] or [phone] Fax [phone]  REPORT OF SURGICAL PATHOLOGY   Accession #: [id number] Patient Name: Schmidt, Erika Visit # : [id number]  MRN: [id number] Physician: Mercie Stalker DOB/Age 30-Nov-1955 (Age: 74) Gender: F Collected Date: 12/01/2023 Received Date: 12/01/2023  FINAL DIAGNOSIS       1. Breast, left, needle core biopsy, 1 o'clock, 13cmfn, heart clip :      - DUCTAL CARCINOMA IN SITU (DCIS), INTERMEDIATE GRADE, AND INVOLVING AN      INTRADUCTAL PAPILLOMA WITH ASSOCIATED CALCIFICATIONS AND SCLEROSIS.       2. Breast, left, needle core biopsy, 1 o'clock, 15cmfn, coil clip :      - DUCTAL CARCINOMA IN SITU (DCIS), INTERMEDIATE GRADE.       3. Breast, left, needle core biopsy, upper outer calcifications, x clip :      - FIBROADENOMATOID CHANGE WITH ASSOCIATED COARSE CALCIFICATIONS.      - NEGATIVE FOR ATYPIA AND MALIGNANCY.  4. Breast, left, needle core biopsy, central upper calcifications, ribbon clip :      - DUCTAL CARCINOMA IN SITU (DCIS), INTERMEDIATE GRADE, AND FOCALLY INVOLVING AN      INTRADUCTAL  PAPILLOMA WITH ASSOCIATED CALCIFICATIONS.       Diagnosis Note : These results were communicated to Ladonna Pickup, RN in the      [facility] on 12/02/2023.      ER will be performed on block 1A and reported in an addendum. This case      underwent intradepartmental consultation and Dr. Talmadge Fail concurs with the      interpretation.      ELECTRONIC SIGNATURE : Rubinas Md, Alexandria Ida , Sports administrator, International aid/development worker  MICROSCOPIC DESCRIPTION  CASE COMMENTS STAINS USED IN DIAGNOSIS: H&E-2 H&E-3 H&E-4 H&E *RECUT 1 SLIDE H&E-2 H&E-3 H&E-4 H&E H&E-2 H&E-3 H&E-4 H&E H&E-2 H&E-3 H&E-4 H&E H&E-2 H&E-3 H&E-4 H&E Stains used in diagnosis 1 ER-ACIS Estrogen receptor (6F11), immunohistochemical stains are performed on formalin fixed, paraffin embedded tissue using a 3,3"-diaminobenzidine (DAB) chromogen and Leica Bond Autostainer System.  The staining intensity of the nucleus is scored manually and is reported as the percentage of tumor cell nuclei demonstrating specific nuclear staining.Specimens are fixed in 10% Neutral Buffered Formalin for at least 6 hours and up to 72 hours.  These tests have not be validated on decalcified tissue.  Results should be interpreted with caution given the possibility of false negative results on decalcified specimens.  ADDENDUM Breast, left, needle core biopsy, 1 o'clock, 13cmfn, heart clip PROGNOSTIC INDICATORS  Results: IMMUNOHISTOCHEMICAL AND MORPHOMETRIC ANALYSIS PERFORMED MANUALLY Estrogen Receptor:  100%, POSITIVE, STRONG STAINING INTENSITY REFERENCE RANGE ESTROGEN RECEPTOR NEGATIVE     0% POSITIVE       =>1% All controls stained appropriately Dillard Frame Md, Pathologist, Electronic Signature ( Signed 04 25 2025)   CLINICAL HISTORY  SPECIMEN(S) OBTAINED 1. Breast, left, needle core biopsy, 1 O'clock, 13cmfn, Heart Clip 2. Breast, left, needle core biopsy, 1 O'clock, 15cmfn, Coil Clip 3. Breast, left, needle core biopsy,  Upper Outer Calcifications, X Clip 4. Breast, left, needle core biopsy, Central Upper Calcifications, Ribbon Clip  SPECIMEN COMMENTS: 1. TIF: 9:32 AM, CIT < 5 mins; irregular mass 2. TIF: 9:38 AM, CIT < 5 mins; irregular mass 3. TIF: 10:06 AM, CIT < 5 mins; calcifications 4. TIF: 10:23 AM, CIT < 5 mins; calcifications SPECIMEN CLINICAL INFORMATION: 1. Highly suspicious for [facility] 2. Suspicious for IMC 3. FA, FCC, R/O DCIS    Gross Description 1. Received in formalin labeled Schmidt, Kili and "left breast 1 o'clock 13 cm fn" (TIF 0932 and CIT less than 5 minutes) are three cores of pink white to yellow soft to firm tissue which average 1.4 x 0.25 x 0.25 cm, submitted in one block. 2. Received in formalin labeled Schmidt, Shavonte and "left breast 1 o'clock 15 cm fn" (TIF [id number] and CIT less than 5 minutes) are three cores of pink white to yellow red soft to firm tissue which range from 1 x 0.3 x 0.25 cm to 1.3 x 0.3 x 0.25 cm,   submitted in one block. 3. Received in formalin labeled Schmidt, Raelee and "left breast calcifications upper outer" (TIF 1006 and CIT less than 5 minutes) are multiple cores and irregular pieces of pink white to yellow soft tissue, 3 x 1.7 x 0.3 cm in aggregate.Tissues with calcifications are submitted in block A with remaining specimen in block B. 4. Received in formalin labeled Schmidt, Arabelle and "left  breast calcifications central upper" (TIF 10:23 and CIT less than 5 minutes) are multiple cores and irregular pieces of pink white to yellow soft tissue, 2.3 x 2 x 0.3 cm in aggregate.Entirely submitted in one block  (SSW:kh 12/01/23)        Report signed out from the following location(s) [place]. [facility] [address], [place] [address] CLIA #: [id number]  [facility] [address] [place], [place] [address] CLIA #: [id number]     RADS: CLINICAL DATA:  Screening recall for possible LEFT breast  distortion  and possible LEFT breast calcifications   EXAM:  DIGITAL DIAGNOSTIC UNILATERAL LEFT MAMMOGRAM WITH TOMOSYNTHESIS AND  CAD; ULTRASOUND LEFT BREAST LIMITED   TECHNIQUE:  Left digital diagnostic mammography and breast tomosynthesis was  performed. The images were evaluated with computer-aided detection.  ; Targeted ultrasound examination of the left breast was performed.   COMPARISON:  Previous exam(s).   ACR Breast Density Category b: There are scattered areas of  fibroglandular density.   FINDINGS:  MAMMOGRAM:   There is an area of persistent architectural distortion the upper  central left breast posterior depth (spot CC 1 of 3 image 20/53, ML  image 42/76). This corresponds with the area of distortion seen on  screening mammogram.   Spot magnification views demonstrate an area of pleomorphic  calcification in a segmental distribution in the upper-outer left  breast middle to posterior depth. These calcifications span at least  9.0 cm in the AP dimension and 5.3 cm in the transverse dimension.  Notably, these calcifications span anteriorly and posteriorly from  the area of architectural distortion described above.   No additional suspicious findings are identified in the left breast.   ULTRASOUND:   Targeted left breast ultrasound was performed:   At 1 o'clock 15 cm from nipple, there is an irregular hypoechoic  mass that measures 0.9 x 0.8 x 0.8 cm. There is mild internal  vascularity. This corresponds with the area of architectural  distortion seen on mammogram.   There is an additional irregular hypoechoic mass at 1 o'clock 13 cm  from nipple. The mass measures 1.2 x 0.8 x 1.0 cm and there is mild  internal vascularity. The distance between the 2 masses is  approximately 1.7 cm in they overall span approximately 3.8 cm.   Targeted left axillary ultrasound demonstrates multiple  morphologically benign lymph nodes.   IMPRESSION:  1. There are 2  adjacent irregular masses in the LEFT breast 1  o'clock position, concerning for malignancy. Recommend further  assessment with ultrasound-guided biopsy of both masses.  2. LEFT breast pleomorphic calcification in the segmental  distribution the upper outer quadrant, spanning at least 9.0 cm.  These calcifications are in association with the irregular masses  described above. Recommend further assessment with stereotactic  guided biopsy.  3. No left axillary lymphadenopathy.   RECOMMENDATION:  1. LEFT breast ultrasound-guided biopsy (2 site).  2. LEFT breast stereotactic guided biopsy (1-2 site): Based on the  clip placement after the two-site ultrasound guided biopsy,  recommend stereotactic guided biopsy of 1-2 distant areas of  calcification in the upper-outer left breast, based on performing  radiologist discretion.   I have discussed the findings and recommendations with the patient.  The biopsy procedure was discussed with the patient and questions  were answered. Patient expressed their understanding of the biopsy  recommendation. Patient will be scheduled for biopsy at her earliest  convenience by the schedulers. Ordering provider  will be notified.  If applicable, a reminder letter will be sent to the patient  regarding the next appointment.   BI-RADS CATEGORY  4: Suspicious.    Electronically Signed    By: Sande Cromer M.D.    On: 11/24/2023 17:09   Assessment:   Ductal carcinoma in situ (DCIS) of left breast [D05.12] Extensive area but large breast size, so should be able to proceed with lumpectomy  Plan:     1. Ductal carcinoma in situ (DCIS) of left breast [D05.12]  Discussed the risk of surgery including recurrence, chronic pain, post-op infxn, poor/delayed wound healing, poor cosmesis, seroma, hematoma formation, and possible re-operation to address said risks. The risks of general anesthetic, if used, includes MI, CVA, sudden death or even reaction to  anesthetic medications also discussed.  Typical post-op recovery time and possbility of activity restrictions were also discussed.  Alternatives include continued observation.  Benefits include possible symptom relief, pathologic evaluation, and/or curative excision.   The patient verbalized understanding and all questions were answered to the patient's satisfaction.  2. Patient has elected to proceed with surgical treatment. Procedure will be scheduled.  LEFT, SCOUT and SLNB.    Pulm risk stratification and stop aspirin  5days prior to procedure.  May need to consider MRI, but will defer to oncology  labs/images/medications/previous chart entries reviewed personally and relevant changes/updates noted above.

## 2023-12-06 NOTE — H&P (Signed)
 Subjective:   CC: Ductal carcinoma in situ (DCIS) of left breast [D05.12] HPI:  Erika Schmidt is a 68 y.o. female who was referred by Erika Castor, Schmidt for evaluation of above. Change was noted on last screening mammogram. Patient does not routinely do self breast exams. Age of menarche was 14. Age of menopause was 64 after hysterectomy.  Patient denies hormonal therapy. Patient is G3P2. Age of first live birth was 14. Patient denies nipple discharge. Patient denies previous breast biopsy. Patient denies a personal history of breast cancer.   Past Medical History:  has a past medical history of CHF (congestive heart failure) (CMS/HHS-HCC), Hyperplastic colon polyp (03/25/2015), Hypertension, Mixed hyperlipidemia (03/05/2015), Moderate mitral insufficiency (03/05/2015), Obesity, and Pneumonia (09/2021).  Past Surgical History:  has a past surgical history that includes Colonoscopy (03/25/2015) and Hysterectomy Vaginal.  Family History: family history includes Liver disease in her father; Stroke in her father.  Social History:  reports that she has never smoked. She has never been exposed to tobacco smoke. She has never used smokeless tobacco. She reports current alcohol use of about 3.0 standard drinks of alcohol per week. She reports that she does not use drugs.  Current Medications: has a current medication list which includes the following prescription(s): acetaminophen , albuterol  mdi (proventil , ventolin , proair ) hfa, aspirin , atorvastatin , betamethasone valerate, cholecalciferol, diltiazem, fluticasone propion-salmeterol, furosemide , losartan, mepolizumab, metformin, prednisone , and triamcinolone.  Allergies:  Allergies as of 12/06/2023 - Reviewed 12/06/2023  Allergen Reaction Noted   Augmentin [amoxicillin-pot clavulanate] Diarrhea 03/19/2022   Floxin [ofloxacin] Unknown 12/20/2014    ROS:  A 15 point review of systems was performed and was negative except as noted in HPI    Objective:     BP (!) 143/84   Pulse 89   Ht 160 cm (5\' 3" )   Wt 87.1 kg (192 lb)   BMI 34.01 kg/m   Constitutional :  No distress, cooperative, alert  Lymphatics/Throat:  Supple with no lymphadenopathy  Respiratory:  Clear to auscultation bilaterally  Cardiovascular:  Regular rate and rhythm  Gastrointestinal: Soft, non-tender, non-distended, no organomegaly.  Musculoskeletal: Steady gait and movement  Skin: Cool and moist  Psychiatric: Normal affect, non-agitated, not confused  Breast: Normal appearance and no palpable abnormality in right breast and axilla.  Chaperone present for exam. Left breast with hematoma from biopsy, no obvious mass or axillary lymphadenopathy.      LABS:  Component 5 d ago  SURGICAL PATHOLOGY SURGICAL PATHOLOGY Erika Schmidt [address], [address] [place], [place] [address] Telephone [phone] or [phone] Fax [phone]  REPORT OF SURGICAL PATHOLOGY   Accession #: [id number] Patient Name: Schmidt, Erika Visit # : [id number]  MRN: [id number] Physician: Erika Schmidt DOB/Age 30-Nov-1955 (Age: 74) Gender: F Collected Date: 12/01/2023 Received Date: 12/01/2023  FINAL DIAGNOSIS       1. Breast, left, needle core biopsy, 1 o'clock, 13cmfn, heart clip :      - DUCTAL CARCINOMA IN SITU (DCIS), INTERMEDIATE GRADE, AND INVOLVING AN      INTRADUCTAL PAPILLOMA WITH ASSOCIATED CALCIFICATIONS AND SCLEROSIS.       2. Breast, left, needle core biopsy, 1 o'clock, 15cmfn, coil clip :      - DUCTAL CARCINOMA IN SITU (DCIS), INTERMEDIATE GRADE.       3. Breast, left, needle core biopsy, upper outer calcifications, x clip :      - FIBROADENOMATOID CHANGE WITH ASSOCIATED COARSE CALCIFICATIONS.      - NEGATIVE FOR ATYPIA AND MALIGNANCY.  4. Breast, left, needle core biopsy, central upper calcifications, ribbon clip :      - DUCTAL CARCINOMA IN SITU (DCIS), INTERMEDIATE GRADE, AND FOCALLY INVOLVING AN      INTRADUCTAL  PAPILLOMA WITH ASSOCIATED CALCIFICATIONS.       Diagnosis Note : These results were communicated to Erika Pickup, RN in the      [facility] on 12/02/2023.      ER will be performed on block 1A and reported in an addendum. This case      underwent intradepartmental consultation and Erika Schmidt concurs with the      interpretation.      ELECTRONIC SIGNATURE : Erika Schmidt , Sports administrator, International aid/development worker  MICROSCOPIC DESCRIPTION  CASE COMMENTS STAINS USED IN DIAGNOSIS: H&E-2 H&E-3 H&E-4 H&E *RECUT 1 SLIDE H&E-2 H&E-3 H&E-4 H&E H&E-2 H&E-3 H&E-4 H&E H&E-2 H&E-3 H&E-4 H&E H&E-2 H&E-3 H&E-4 H&E Stains used in diagnosis 1 ER-ACIS Estrogen receptor (6F11), immunohistochemical stains are performed on formalin fixed, paraffin embedded tissue using a 3,3"-diaminobenzidine (DAB) chromogen and Leica Bond Autostainer System.  The staining intensity of the nucleus is scored manually and is reported as the percentage of tumor cell nuclei demonstrating specific nuclear staining.Specimens are fixed in 10% Neutral Buffered Formalin for at least 6 hours and up to 72 hours.  These tests have not be validated on decalcified tissue.  Results should be interpreted with caution given the possibility of false negative results on decalcified specimens.  ADDENDUM Breast, left, needle core biopsy, 1 o'clock, 13cmfn, heart clip PROGNOSTIC INDICATORS  Results: IMMUNOHISTOCHEMICAL AND MORPHOMETRIC ANALYSIS PERFORMED MANUALLY Estrogen Receptor:  100%, POSITIVE, STRONG STAINING INTENSITY REFERENCE RANGE ESTROGEN RECEPTOR NEGATIVE     0% POSITIVE       =>1% All controls stained appropriately Erika Schmidt, Pathologist, Electronic Signature ( Signed 04 25 2025)   CLINICAL HISTORY  SPECIMEN(S) OBTAINED 1. Breast, left, needle core biopsy, 1 O'clock, 13cmfn, Heart Clip 2. Breast, left, needle core biopsy, 1 O'clock, 15cmfn, Coil Clip 3. Breast, left, needle core biopsy,  Upper Outer Calcifications, X Clip 4. Breast, left, needle core biopsy, Central Upper Calcifications, Ribbon Clip  SPECIMEN COMMENTS: 1. TIF: 9:32 AM, CIT < 5 mins; irregular mass 2. TIF: 9:38 AM, CIT < 5 mins; irregular mass 3. TIF: 10:06 AM, CIT < 5 mins; calcifications 4. TIF: 10:23 AM, CIT < 5 mins; calcifications SPECIMEN CLINICAL INFORMATION: 1. Highly suspicious for [facility] 2. Suspicious for IMC 3. FA, FCC, R/O DCIS    Gross Description 1. Received in formalin labeled Schmidt, Kili and "left breast 1 o'clock 13 cm fn" (TIF 0932 and CIT less than 5 minutes) are three cores of pink white to yellow soft to firm tissue which average 1.4 x 0.25 x 0.25 cm, submitted in one block. 2. Received in formalin labeled Schmidt, Shavonte and "left breast 1 o'clock 15 cm fn" (TIF [id number] and CIT less than 5 minutes) are three cores of pink white to yellow red soft to firm tissue which range from 1 x 0.3 x 0.25 cm to 1.3 x 0.3 x 0.25 cm,   submitted in one block. 3. Received in formalin labeled Schmidt, Raelee and "left breast calcifications upper outer" (TIF 1006 and CIT less than 5 minutes) are multiple cores and irregular pieces of pink white to yellow soft tissue, 3 x 1.7 x 0.3 cm in aggregate.Tissues with calcifications are submitted in block A with remaining specimen in block B. 4. Received in formalin labeled Schmidt, Arabelle and "left  breast calcifications central upper" (TIF 10:23 and CIT less than 5 minutes) are multiple cores and irregular pieces of pink white to yellow soft tissue, 2.3 x 2 x 0.3 cm in aggregate.Entirely submitted in one block  (SSW:kh 12/01/23)        Report signed out from the following location(s) [place] [place] [facility] [address] [place], [place] [address] CLIA #: [id number]  [facility] [address] [place], [place] [address] CLIA #: [id number]     RADS: CLINICAL DATA:  Screening recall for possible LEFT breast  distortion  and possible LEFT breast calcifications   EXAM:  DIGITAL DIAGNOSTIC UNILATERAL LEFT MAMMOGRAM WITH TOMOSYNTHESIS AND  CAD; ULTRASOUND LEFT BREAST LIMITED   TECHNIQUE:  Left digital diagnostic mammography and breast tomosynthesis was  performed. The images were evaluated with computer-aided detection.  ; Targeted ultrasound examination of the left breast was performed.   COMPARISON:  Previous exam(s).   ACR Breast Density Category b: There are scattered areas of  fibroglandular density.   FINDINGS:  MAMMOGRAM:   There is an area of persistent architectural distortion the upper  central left breast posterior depth (spot CC 1 of 3 image 20/53, ML  image 42/76). This corresponds with the area of distortion seen on  screening mammogram.   Spot magnification views demonstrate an area of pleomorphic  calcification in a segmental distribution in the upper-outer left  breast middle to posterior depth. These calcifications span at least  9.0 cm in the AP dimension and 5.3 cm in the transverse dimension.  Notably, these calcifications span anteriorly and posteriorly from  the area of architectural distortion described above.   No additional suspicious findings are identified in the left breast.   ULTRASOUND:   Targeted left breast ultrasound was performed:   At 1 o'clock 15 cm from nipple, there is an irregular hypoechoic  mass that measures 0.9 x 0.8 x 0.8 cm. There is mild internal  vascularity. This corresponds with the area of architectural  distortion seen on mammogram.   There is an additional irregular hypoechoic mass at 1 o'clock 13 cm  from nipple. The mass measures 1.2 x 0.8 x 1.0 cm and there is mild  internal vascularity. The distance between the 2 masses is  approximately 1.7 cm in they overall span approximately 3.8 cm.   Targeted left axillary ultrasound demonstrates multiple  morphologically benign lymph nodes.   IMPRESSION:  1. There are 2  adjacent irregular masses in the LEFT breast 1  o'clock position, concerning for malignancy. Recommend further  assessment with ultrasound-guided biopsy of both masses.  2. LEFT breast pleomorphic calcification in the segmental  distribution the upper outer quadrant, spanning at least 9.0 cm.  These calcifications are in association with the irregular masses  described above. Recommend further assessment with stereotactic  guided biopsy.  3. No left axillary lymphadenopathy.   RECOMMENDATION:  1. LEFT breast ultrasound-guided biopsy (2 site).  2. LEFT breast stereotactic guided biopsy (1-2 site): Based on the  clip placement after the two-site ultrasound guided biopsy,  recommend stereotactic guided biopsy of 1-2 distant areas of  calcification in the upper-outer left breast, based on performing  radiologist discretion.   I have discussed the findings and recommendations with the patient.  The biopsy procedure was discussed with the patient and questions  were answered. Patient expressed their understanding of the biopsy  recommendation. Patient will be scheduled for biopsy at her earliest  convenience by the schedulers. Ordering provider  will be notified.  If applicable, a reminder letter will be sent to the patient  regarding the next appointment.   BI-RADS CATEGORY  4: Suspicious.    Electronically Signed    By: Sande Cromer M.D.    On: 11/24/2023 17:09   Assessment:   Ductal carcinoma in situ (DCIS) of left breast [D05.12] Extensive area but large breast size, so should be able to proceed with lumpectomy  Plan:     1. Ductal carcinoma in situ (DCIS) of left breast [D05.12]  Discussed the risk of surgery including recurrence, chronic pain, post-op infxn, poor/delayed wound healing, poor cosmesis, seroma, hematoma formation, and possible re-operation to address said risks. The risks of general anesthetic, if used, includes MI, CVA, sudden death or even reaction to  anesthetic medications also discussed.  Typical post-op recovery time and possbility of activity restrictions were also discussed.  Alternatives include continued observation.  Benefits include possible symptom relief, pathologic evaluation, and/or curative excision.   The patient verbalized understanding and all questions were answered to the patient's satisfaction.  2. Patient has elected to proceed with surgical treatment. Procedure will be scheduled.  LEFT, SCOUT and SLNB.    Pulm risk stratification and stop aspirin  5days prior to procedure.  May need to consider MRI, but will defer to oncology  labs/images/medications/previous chart entries reviewed personally and relevant changes/updates noted above.

## 2023-12-08 ENCOUNTER — Other Ambulatory Visit: Payer: Self-pay | Admitting: Surgery

## 2023-12-08 DIAGNOSIS — D0512 Intraductal carcinoma in situ of left breast: Secondary | ICD-10-CM

## 2023-12-17 ENCOUNTER — Other Ambulatory Visit: Payer: Self-pay | Admitting: Surgery

## 2023-12-17 DIAGNOSIS — D0512 Intraductal carcinoma in situ of left breast: Secondary | ICD-10-CM

## 2023-12-22 ENCOUNTER — Encounter
Admission: RE | Admit: 2023-12-22 | Discharge: 2023-12-22 | Disposition: A | Discharge status: Home / Self Care | Source: Ambulatory Visit | Attending: Surgery | Admitting: Surgery

## 2023-12-22 ENCOUNTER — Other Ambulatory Visit: Payer: Self-pay

## 2023-12-22 ENCOUNTER — Ambulatory Visit
Admission: RE | Admit: 2023-12-22 | Discharge: 2023-12-22 | Discharge status: Home / Self Care | Source: Ambulatory Visit | Attending: Surgery

## 2023-12-22 ENCOUNTER — Ambulatory Visit
Admission: RE | Admit: 2023-12-22 | Discharge: 2023-12-22 | Disposition: A | Discharge status: Home / Self Care | Source: Ambulatory Visit | Attending: Surgery | Admitting: Surgery

## 2023-12-22 DIAGNOSIS — E119 Type 2 diabetes mellitus without complications: Secondary | ICD-10-CM | POA: Diagnosis present

## 2023-12-22 DIAGNOSIS — D0512 Intraductal carcinoma in situ of left breast: Secondary | ICD-10-CM | POA: Insufficient documentation

## 2023-12-22 DIAGNOSIS — Z01812 Encounter for preprocedural laboratory examination: Secondary | ICD-10-CM

## 2023-12-22 DIAGNOSIS — I10 Essential (primary) hypertension: Secondary | ICD-10-CM | POA: Insufficient documentation

## 2023-12-22 DIAGNOSIS — Z01818 Encounter for other preprocedural examination: Secondary | ICD-10-CM | POA: Insufficient documentation

## 2023-12-22 HISTORY — DX: Chronic respiratory failure with hypoxia: J96.11

## 2023-12-22 HISTORY — DX: Pulmonary fibrosis, unspecified: J84.10

## 2023-12-22 HISTORY — DX: Interstitial pulmonary disease, unspecified: J84.9

## 2023-12-22 HISTORY — DX: Prediabetes: R73.03

## 2023-12-22 HISTORY — DX: Dyspnea, unspecified: R06.00

## 2023-12-22 HISTORY — PX: BREAST BIOPSY: SHX20

## 2023-12-22 HISTORY — DX: Bronchiectasis, uncomplicated: J47.9

## 2023-12-22 LAB — BASIC METABOLIC PANEL WITH GFR
Anion gap: 11 (ref 5–15)
BUN: 12 mg/dL (ref 8–23)
CO2: 26 mmol/L (ref 22–32)
Calcium: 9.1 mg/dL (ref 8.9–10.3)
Chloride: 102 mmol/L (ref 98–111)
Creatinine, Ser: 0.93 mg/dL (ref 0.44–1.00)
GFR, Estimated: >60 mL/min (ref >60)
Glucose, Bld: 124 mg/dL — ABNORMAL HIGH (ref 70–99)
Potassium: 3.5 mmol/L (ref 3.5–5.1)
Sodium: 139 mmol/L (ref 135–145)

## 2023-12-22 LAB — CBC
HCT: 51 % — ABNORMAL HIGH (ref 36.0–46.0)
Hemoglobin: 16 g/dL — ABNORMAL HIGH (ref 12.0–15.0)
MCH: 25.6 pg — ABNORMAL LOW (ref 26.0–34.0)
MCHC: 31.4 g/dL (ref 30.0–36.0)
MCV: 81.5 fL (ref 80.0–100.0)
Platelets: 314 10*3/uL (ref 150–400)
RBC: 6.26 MIL/uL — ABNORMAL HIGH (ref 3.87–5.11)
RDW: 14.3 % (ref 11.5–15.5)
WBC: 11.2 10*3/uL — ABNORMAL HIGH (ref 4.0–10.5)
nRBC: 0 % (ref 0.0–0.2)

## 2023-12-22 MED ORDER — LIDOCAINE HCL 1 % IJ SOLN
10.0000 mL | Freq: Once | INTRAMUSCULAR | Status: AC
  Administered 2023-12-22: 10 mL
  Filled 2023-12-22: qty 10

## 2023-12-22 MED ORDER — LIDOCAINE HCL 1 % IJ SOLN
10.0000 mL | Freq: Once | INTRAMUSCULAR | Status: DC
Start: 2023-12-22 — End: 2023-12-22
  Filled 2023-12-22: qty 10

## 2023-12-22 NOTE — Patient Instructions (Addendum)
 Your procedure is scheduled on: 12/30/23 - Thursday Report to the Registration Desk on the 1st floor of the [facility]. To find out your arrival time, please call [phone] between 1PM - 3PM on: 12/29/23 - Wednesday If your arrival time is 6:00 am, do not arrive before that time as the [facility] entrance doors do not open until 6:00 am.  REMEMBER: Instructions that are not followed completely may result in serious medical risk, up to and including death; or upon the discretion of your surgeon and anesthesiologist your surgery may need to be rescheduled.  Do not eat food after midnight the night before surgery.  No gum chewing or hard candies.  You may however, drink CLEAR liquids up to 2 hours before you are scheduled to arrive for your surgery. Do not drink anything within 2 hours of your scheduled arrival time.  Clear liquids include: - water  - apple juice - Gatorade ( no RED ) - Black coffee and/or Tea  ( no creamer, milk,  dairy)   One week prior to surgery: Stop Anti-inflammatories (NSAIDS) such as Advil, Aleve, Ibuprofen, Motrin, Naproxen, Naprosyn and Aspirin  based products such as Excedrin, Goody's Powder, BC Powder.You may take Tylenol  if needed for pain up until the day of surgery.  Stop ANY OVER THE COUNTER supplements until after surgery.  HOLD Aspirin  beginning 05/17,  5 days prior to your procedure  HOLD metFORMIN beginning 05/20.   ON THE DAY OF SURGERY ONLY TAKE THESE MEDICATIONS WITH SIPS OF WATER:  diltiazem (CARDIZEM)  fluticasone-salmeterol (WIXELA INHUB)   Use inhalers albuterol  (VENTOLIN  HFA) on the day of surgery and bring to the hospital.  No Alcohol for 24 hours before or after surgery.  No Smoking including e-cigarettes for 24 hours before surgery.  No chewable tobacco products for at least 6 hours before surgery.  No nicotine patches on the day of surgery.  Do not use any "recreational" drugs for at least a week (preferably 2 weeks)  before your surgery.  Please be advised that the combination of cocaine and anesthesia may have negative outcomes, up to and including death. If you test positive for cocaine, your surgery will be cancelled.  On the morning of surgery brush your teeth with toothpaste and water, you may rinse your mouth with mouthwash if you wish. Do not swallow any toothpaste or mouthwash.  Use CHG Soap or wipes as directed on instruction sheet.  Do not wear jewelry, make-up, hairpins, clips or nail polish.  For welded (permanent) jewelry: bracelets, anklets, waist bands, etc.  Please have this removed prior to surgery.  If it is not removed, there is a chance that hospital personnel will need to cut it off on the day of surgery.  Do not wear lotions, powders, or perfumes.   Do not shave body hair from the neck down 48 hours before surgery.  Contact lenses, hearing aids and dentures may not be worn into surgery.  Do not bring valuables to the hospital. [facility] is not responsible for any missing/lost belongings or valuables.   Notify your doctor if there is any change in your medical condition (cold, fever, infection).  Wear comfortable clothing (specific to your surgery type) to the hospital.  After surgery, you can help prevent lung complications by doing breathing exercises.  Take deep breaths and cough every 1-2 hours. Your doctor may order a device called an Incentive Spirometer to help you take deep breaths.  When coughing or sneezing, hold a pillow  firmly against your incision with both hands. This is called "splinting." Doing this helps protect your incision. It also decreases belly discomfort.  If you are being admitted to the hospital overnight, leave your suitcase in the car. After surgery it may be brought to your room.  In case of increased patient census, it may be necessary for you, the patient, to continue your postoperative care in the Same Day Surgery department.  If you are  being discharged the day of surgery, you will not be allowed to drive home. You will need a responsible individual to drive you home and stay with you for 24 hours after surgery.   If you are taking public transportation, you will need to have a responsible individual with you.  Please call the Pre-admissions Testing Dept. at [phone] if you have any questions about these instructions.  Surgery Visitation Policy:  Patients having surgery or a procedure may have two visitors.  Children under the age of 69 must have an adult with them who is not the patient.  Inpatient Visitation:    Visiting hours are 7 a.m. to 8 p.m. Up to four visitors are allowed at one time in a patient room. The visitors may rotate out with other people during the day.  One visitor age 30 or older may stay with the patient overnight and must be in the room by 8 p.m.     Preparing for Surgery with CHLORHEXIDINE  GLUCONATE (CHG) Soap  Chlorhexidine  Gluconate (CHG) Soap  o An antiseptic cleaner that kills germs and bonds with the skin to continue killing germs even after washing  o Used for showering the night before surgery and morning of surgery  Before surgery, you can play an important role by reducing the number of germs on your skin.  CHG (Chlorhexidine  gluconate) soap is an antiseptic cleanser which kills germs and bonds with the skin to continue killing germs even after washing.  Please do not use if you have an allergy to CHG or antibacterial soaps. If your skin becomes reddened/irritated stop using the CHG.  1. Shower the NIGHT BEFORE SURGERY and the MORNING OF SURGERY with CHG soap.  2. If you choose to wash your hair, wash your hair first as usual with your normal shampoo.  3. After shampooing, rinse your hair and body thoroughly to remove the shampoo.  4. Use CHG as you would any other liquid soap. You can apply CHG directly to the skin and wash gently with a scrungie or a clean  washcloth.  5. Apply the CHG soap to your body only from the neck down. Do not use on open wounds or open sores. Avoid contact with your eyes, ears, mouth, and genitals (private parts). Wash face and genitals (private parts) with your normal soap.  6. Wash thoroughly, paying special attention to the area where your surgery will be performed.  7. Thoroughly rinse your body with warm water.  8. Do not shower/wash with your normal soap after using and rinsing off the CHG soap.  9. Pat yourself dry with a clean towel.  10. Wear clean pajamas to bed the night before surgery.  12. Place clean sheets on your bed the night of your first shower and do not sleep with pets.  13. Shower again with the CHG soap on the day of surgery prior to arriving at the hospital.  14. Do not apply any deodorants/lotions/powders.  15. Please wear clean clothes to the hospital.

## 2023-12-29 ENCOUNTER — Inpatient Hospital Stay: Attending: Oncology | Admitting: Hospice and Palliative Medicine

## 2023-12-29 ENCOUNTER — Encounter: Payer: Self-pay | Admitting: *Deleted

## 2023-12-29 DIAGNOSIS — D0512 Intraductal carcinoma in situ of left breast: Secondary | ICD-10-CM

## 2023-12-29 MED ORDER — CHLORHEXIDINE GLUCONATE 0.12 % MT SOLN
15.0000 mL | Freq: Once | OROMUCOSAL | Status: AC
  Administered 2023-12-30: 15 mL via OROMUCOSAL

## 2023-12-29 MED ORDER — CHLORHEXIDINE GLUCONATE CLOTH 2 % EX PADS
6.0000 | MEDICATED_PAD | Freq: Once | CUTANEOUS | Status: AC
  Administered 2023-12-30: 6 via TOPICAL

## 2023-12-29 MED ORDER — CEFAZOLIN SODIUM-DEXTROSE 2-4 GM/100ML-% IV SOLN
2.0000 g | INTRAVENOUS | Status: AC
  Administered 2023-12-30: 2 g via INTRAVENOUS

## 2023-12-29 MED ORDER — ORAL CARE MOUTH RINSE: 15.0000 mL | Freq: Once | OROMUCOSAL | Status: AC

## 2023-12-29 MED ORDER — SODIUM CHLORIDE 0.9% FLUSH: 3.0000 mL | INTRAVENOUS | Status: DC | PRN

## 2023-12-29 MED ORDER — SODIUM CHLORIDE 0.9% FLUSH: 3.0000 mL | Freq: Two times a day (BID) | INTRAVENOUS | Status: DC

## 2023-12-29 NOTE — Progress Notes (Signed)
 Lumpectomy is scheduled for 5/22.   She will see Dr. Randy Buttery and Dr. Jacalyn Martin on June 2 and [facility] post surgery on 6/11.

## 2023-12-29 NOTE — Progress Notes (Signed)
 Multidisciplinary Oncology Council Documentation  Erika Schmidt was presented by our [facility] on 12/29/2023, which included representatives from:  Palliative Care Dietitian  Physical/Occupational Therapist Nurse Navigator Genetics Social work Survivorship RN Financial Navigator Research RN   Erika Schmidt currently presents with history of breast cancer  We reviewed previous medical and familial history, history of present illness, and recent lab results along with all available histopathologic and imaging studies. The MOC considered available treatment options and made the following recommendations/referrals:  Rehab screening  The MOC is a meeting of clinicians from various specialty areas who evaluate and discuss patients for whom a multidisciplinary approach is being considered. Final determinations in the plan of care are those of the provider(s).   Today's extended care, comprehensive team conference, Erika Schmidt was not present for the discussion and was not examined.

## 2023-12-30 ENCOUNTER — Ambulatory Visit
Admission: RE | Admit: 2023-12-30 | Discharge: 2023-12-30 | Disposition: A | Discharge status: Home / Self Care | Source: Ambulatory Visit | Attending: Surgery | Admitting: Surgery

## 2023-12-30 ENCOUNTER — Ambulatory Visit: Payer: Self-pay | Admitting: Urgent Care

## 2023-12-30 ENCOUNTER — Other Ambulatory Visit: Payer: Self-pay

## 2023-12-30 ENCOUNTER — Encounter
Admission: RE | Disposition: A | Discharge status: Home / Self Care | Payer: Self-pay | Source: Ambulatory Visit | Attending: Surgery

## 2023-12-30 ENCOUNTER — Encounter: Payer: Self-pay | Admitting: Surgery

## 2023-12-30 DIAGNOSIS — E669 Obesity, unspecified: Secondary | ICD-10-CM | POA: Insufficient documentation

## 2023-12-30 DIAGNOSIS — E119 Type 2 diabetes mellitus without complications: Secondary | ICD-10-CM

## 2023-12-30 DIAGNOSIS — D0512 Intraductal carcinoma in situ of left breast: Secondary | ICD-10-CM | POA: Diagnosis present

## 2023-12-30 DIAGNOSIS — Z6833 Body mass index (BMI) 33.0-33.9, adult: Secondary | ICD-10-CM | POA: Diagnosis not present

## 2023-12-30 DIAGNOSIS — I110 Hypertensive heart disease with heart failure: Secondary | ICD-10-CM | POA: Diagnosis not present

## 2023-12-30 DIAGNOSIS — J8410 Pulmonary fibrosis, unspecified: Secondary | ICD-10-CM | POA: Insufficient documentation

## 2023-12-30 DIAGNOSIS — Z9981 Dependence on supplemental oxygen: Secondary | ICD-10-CM | POA: Diagnosis not present

## 2023-12-30 DIAGNOSIS — Z01812 Encounter for preprocedural laboratory examination: Secondary | ICD-10-CM

## 2023-12-30 DIAGNOSIS — I509 Heart failure, unspecified: Secondary | ICD-10-CM | POA: Insufficient documentation

## 2023-12-30 HISTORY — PX: BREAST LUMPECTOMY WITH RADIO FREQUENCY LOCALIZER: SHX6897

## 2023-12-30 HISTORY — PX: AXILLARY SENTINEL NODE BIOPSY: SHX5738

## 2023-12-30 LAB — GLUCOSE, CAPILLARY: Glucose-Capillary: 81 mg/dL (ref 70–99)

## 2023-12-30 SURGERY — BREAST LUMPECTOMY WITH RADIO FREQUENCY LOCALIZER
Anesthesia: General | Laterality: Left

## 2023-12-30 MED ORDER — LIDOCAINE HCL 1 % IJ SOLN
INTRAMUSCULAR | Status: DC | PRN
  Administered 2023-12-30: 26 mL via INTRAMUSCULAR

## 2023-12-30 MED ORDER — ROCURONIUM BROMIDE 10 MG/ML (PF) SYRINGE
PREFILLED_SYRINGE | INTRAVENOUS | Status: AC
  Filled 2023-12-30: qty 10

## 2023-12-30 MED ORDER — LACTATED RINGERS IV SOLN: INTRAVENOUS | Status: DC

## 2023-12-30 MED ORDER — LIDOCAINE HCL (PF) 1 % IJ SOLN
INTRAMUSCULAR | Status: AC
  Filled 2023-12-30: qty 30

## 2023-12-30 MED ORDER — LIDOCAINE HCL (PF) 2 % IJ SOLN
INTRAMUSCULAR | Status: AC
  Filled 2023-12-30: qty 5

## 2023-12-30 MED ORDER — TRAMADOL HCL 50 MG PO TABS: 50.0000 mg | ORAL_TABLET | Freq: Three times a day (TID) | ORAL | 0 refills | Status: DC | PRN

## 2023-12-30 MED ORDER — DOCUSATE SODIUM 100 MG PO CAPS: 100.0000 mg | ORAL_CAPSULE | Freq: Two times a day (BID) | ORAL | 0 refills | Status: AC | PRN

## 2023-12-30 MED ORDER — OXYCODONE HCL 5 MG PO TABS
5.0000 mg | ORAL_TABLET | Freq: Once | ORAL | Status: AC | PRN
  Administered 2023-12-30: 5 mg via ORAL

## 2023-12-30 MED ORDER — LACTATED RINGERS IV SOLN
INTRAVENOUS | Status: DC | PRN
Start: 2023-12-30 — End: 2023-12-30

## 2023-12-30 MED ORDER — LIDOCAINE HCL (CARDIAC) PF 100 MG/5ML IV SOSY
PREFILLED_SYRINGE | INTRAVENOUS | Status: DC | PRN
  Administered 2023-12-30: 60 mg via INTRAVENOUS

## 2023-12-30 MED ORDER — TECHNETIUM TC 99M TILMANOCEPT KIT
1.0000 | PACK | Freq: Once | INTRAVENOUS | Status: AC | PRN
  Administered 2023-12-30: 1 via INTRADERMAL

## 2023-12-30 MED ORDER — ONDANSETRON HCL 4 MG/2ML IJ SOLN
INTRAMUSCULAR | Status: AC
  Filled 2023-12-30: qty 2

## 2023-12-30 MED ORDER — OXYCODONE HCL 5 MG PO TABS
ORAL_TABLET | ORAL | Status: AC
  Filled 2023-12-30: qty 1

## 2023-12-30 MED ORDER — CEFAZOLIN SODIUM-DEXTROSE 2-4 GM/100ML-% IV SOLN
INTRAVENOUS | Status: AC
  Filled 2023-12-30: qty 100

## 2023-12-30 MED ORDER — DEXAMETHASONE SODIUM PHOSPHATE 10 MG/ML IJ SOLN
INTRAMUSCULAR | Status: DC | PRN
  Administered 2023-12-30: 5 mg via INTRAVENOUS

## 2023-12-30 MED ORDER — PROPOFOL 10 MG/ML IV BOLUS
INTRAVENOUS | Status: AC
  Filled 2023-12-30: qty 20

## 2023-12-30 MED ORDER — FENTANYL CITRATE (PF) 100 MCG/2ML IJ SOLN
25.0000 ug | INTRAMUSCULAR | Status: DC | PRN
  Administered 2023-12-30 (×3): 25 ug via INTRAVENOUS

## 2023-12-30 MED ORDER — BUPIVACAINE-EPINEPHRINE (PF) 0.5% -1:200000 IJ SOLN
INTRAMUSCULAR | Status: AC
  Filled 2023-12-30: qty 30

## 2023-12-30 MED ORDER — ACETAMINOPHEN 10 MG/ML IV SOLN: 1000.0000 mg | Freq: Once | INTRAVENOUS | Status: DC | PRN

## 2023-12-30 MED ORDER — PROPOFOL 10 MG/ML IV BOLUS
INTRAVENOUS | Status: DC | PRN
  Administered 2023-12-30: 120 mg via INTRAVENOUS

## 2023-12-30 MED ORDER — STERILE WATER FOR IRRIGATION IR SOLN
Status: DC | PRN
  Administered 2023-12-30: 1

## 2023-12-30 MED ORDER — ONDANSETRON HCL 4 MG/2ML IJ SOLN: 4.0000 mg | Freq: Once | INTRAMUSCULAR | Status: DC | PRN

## 2023-12-30 MED ORDER — EPHEDRINE SULFATE-NACL 50-0.9 MG/10ML-% IV SOSY
PREFILLED_SYRINGE | INTRAVENOUS | Status: DC | PRN
  Administered 2023-12-30: 5 mg via INTRAVENOUS
  Administered 2023-12-30 (×2): 10 mg via INTRAVENOUS

## 2023-12-30 MED ORDER — EPHEDRINE 5 MG/ML INJ
INTRAVENOUS | Status: AC
  Filled 2023-12-30: qty 5

## 2023-12-30 MED ORDER — FENTANYL CITRATE (PF) 100 MCG/2ML IJ SOLN
INTRAMUSCULAR | Status: DC | PRN
  Administered 2023-12-30: 50 ug via INTRAVENOUS
  Administered 2023-12-30 (×2): 25 ug via INTRAVENOUS

## 2023-12-30 MED ORDER — FENTANYL CITRATE (PF) 100 MCG/2ML IJ SOLN
INTRAMUSCULAR | Status: AC
  Filled 2023-12-30: qty 2

## 2023-12-30 MED ORDER — PHENYLEPHRINE 80 MCG/ML (10ML) SYRINGE FOR IV PUSH (FOR BLOOD PRESSURE SUPPORT)
PREFILLED_SYRINGE | INTRAVENOUS | Status: DC | PRN
  Administered 2023-12-30 (×2): 80 ug via INTRAVENOUS

## 2023-12-30 MED ORDER — OXYCODONE HCL 5 MG/5ML PO SOLN: 5.0000 mg | Freq: Once | ORAL | Status: AC | PRN

## 2023-12-30 MED ORDER — MIDAZOLAM HCL 2 MG/2ML IJ SOLN
INTRAMUSCULAR | Status: AC
  Filled 2023-12-30: qty 2

## 2023-12-30 MED ORDER — ONDANSETRON HCL 4 MG/2ML IJ SOLN
INTRAMUSCULAR | Status: DC | PRN
  Administered 2023-12-30: 4 mg via INTRAVENOUS

## 2023-12-30 MED ORDER — CHLORHEXIDINE GLUCONATE 0.12 % MT SOLN
OROMUCOSAL | Status: AC
  Filled 2023-12-30: qty 15

## 2023-12-30 MED ORDER — DEXAMETHASONE SODIUM PHOSPHATE 10 MG/ML IJ SOLN
INTRAMUSCULAR | Status: AC
  Filled 2023-12-30: qty 1

## 2023-12-30 MED ORDER — MIDAZOLAM HCL 2 MG/2ML IJ SOLN
INTRAMUSCULAR | Status: DC | PRN
  Administered 2023-12-30: 2 mg via INTRAVENOUS

## 2023-12-30 SURGICAL SUPPLY — 32 items
BLADE PHOTON ILLUMINATED (MISCELLANEOUS) ×1 IMPLANT
BLADE SURG 15 STRL LF DISP TIS (BLADE) ×1 IMPLANT
CHLORAPREP W/TINT 26 (MISCELLANEOUS) IMPLANT
COVER PROBE GAMMA FINDER SLV (MISCELLANEOUS) ×1 IMPLANT
DERMABOND ADVANCED .7 DNX12 (GAUZE/BANDAGES/DRESSINGS) ×1 IMPLANT
DEVICE DUBIN SPECIMEN MAMMOGRA (MISCELLANEOUS) ×1 IMPLANT
DRAPE LAPAROTOMY TRNSV 106X77 (MISCELLANEOUS) ×1 IMPLANT
ELECTRODE REM PT RTRN 9FT ADLT (ELECTROSURGICAL) ×1 IMPLANT
GAUZE 4X4 16PLY ~~LOC~~+RFID DBL (SPONGE) IMPLANT
GLOVE BIOGEL PI IND STRL 7.0 (GLOVE) ×1 IMPLANT
GLOVE SURG SYN 6.5 ES PF (GLOVE) ×3 IMPLANT
GLOVE SURG SYN 6.5 PF PI (GLOVE) ×3 IMPLANT
GOWN STRL REUS W/ TWL LRG LVL3 (GOWN DISPOSABLE) ×3 IMPLANT
KIT MARKER MARGIN INK (KITS) ×1 IMPLANT
KIT TURNOVER KIT A (KITS) ×1 IMPLANT
LABEL OR SOLS (LABEL) ×1 IMPLANT
LIGHT WAVEGUIDE WIDE FLAT (MISCELLANEOUS) IMPLANT
MANIFOLD NEPTUNE II (INSTRUMENTS) ×1 IMPLANT
MARKER MARGIN CORRECT CLIP (MARKER) ×1 IMPLANT
NDL HYPO 22X1.5 SAFETY MO (MISCELLANEOUS) ×2 IMPLANT
NEEDLE HYPO 22X1.5 SAFETY MO (MISCELLANEOUS) ×2 IMPLANT
PACK BASIN MINOR ARMC (MISCELLANEOUS) ×1 IMPLANT
SHEATH BREAST BIOPSY SKIN MKR (SHEATH) ×1 IMPLANT
SUT SILK 2-0 30XBRD TIE 12 (SUTURE) IMPLANT
SUT SILK 3 0 12 30 (SUTURE) IMPLANT
SUT VIC AB 3-0 SH 27X BRD (SUTURE) ×1 IMPLANT
SUTURE MNCRL 4-0 27XMF (SUTURE) ×1 IMPLANT
SYR 20ML LL LF (SYRINGE) ×1 IMPLANT
TRAP FLUID SMOKE EVACUATOR (MISCELLANEOUS) ×1 IMPLANT
TRAP NEPTUNE SPECIMEN COLLECT (MISCELLANEOUS) ×1 IMPLANT
WATER STERILE IRR 1000ML POUR (IV SOLUTION) ×1 IMPLANT
WATER STERILE IRR 500ML POUR (IV SOLUTION) ×1 IMPLANT

## 2023-12-30 NOTE — Op Note (Signed)
 Preoperative diagnosis: Left breast DCIS.  Postoperative diagnosis: Same.   Procedure: SCOUT tag-localized left breast partial mastectomy.                      Left axillary Sentinel Lymph node biopsy  Anesthesia: GETA  Surgeon: Dr. Conrado Delay  Wound Classification: Clean  Indications: Patient is a 68 y.o. female with a nonpalpable left breast mass noted on mammography with core biopsy demonstrating DCIS requires SCOUT localizer placement, partial mastectomy for treatment with sentinel lymph node biopsy.   Specimen: Left breast mass, Sentinel Lymph nodes x 1, additional margins  Complications: None  Estimated Blood Loss: 30 mL  Findings: 1. Specimen mammography shows marker and SCOUT localizer on specimen 2. Pathology call refers gross examination of margins was positive on all margins except inferior 3. No other palpable mass or lymph node identified.   Operation performed with curative intent:Yes  Tracer(s) used to identify sentinel nodes in the upfront surgery (non-neoadjuvant) setting (select all that apply):Radioactive Tracer  Tracer(s) used to identify sentinel nodes in the neoadjuvant setting (select all that apply):N/A  All nodes (colored or non-colored) present at the end of a dye-filled lymphatic channel were removed:N/A  All significantly radioactive nodes were removed:Yes  All palpable suspicious nodes were removed:N/A  Biopsy-proven positive nodes marked with clips prior to chemotherapy were identified and removed:N/A    Description of procedure: SCOUT localization was performed by radiology prior to procedure. In the nuclear medicine suite, the subareolar region was injected with Tc-99 sulfur colloid the morning of procedure. Localization studies were reviewed. The patient was taken to the operating room and placed supine on the operating table, and after general anesthesia the left breast and axilla were prepped and draped in the usual sterile fashion. A  time-out was completed verifying correct patient, procedure, site, positioning, and implant(s) and/or special equipment prior to beginning this procedure.  By identifying the SCOUT localizer, the probable trajectory and location of the mass was visualized. A skin incision was planned in such a way as to minimize the amount of dissection to reach the mass.  The skin incision was made after infusion of local. Flaps were raised and  Sharp and blunt dissection was then taken down to the mass, taking care to include the entire SCOUT localizer and a margin of grossly normal tissue. The specimen was removed. The specimen was oriented with paint. Imaging reviewed and the entire target lesion had been resected, with biopsy clip and localizer within the specimen.Gross margin analysis by pathology noted scar tissue at every margin except the inferior margin.  The biopsy cavity was essentially carved out including all margins again minus the inferior margin.  3-0 silk then used to approximate the 2 ends of the anterior margin together to recreate the biopsy cavity prior to painting the margins.  The specimen then passed off operative field pending pathology.   A hand-held gamma probe was used to identify the location of the hottest spot in the axilla. An incision was made around the caudal axillary hairline. Sharp and blunt Dissection was carried down to subdermal facias. The probe was placed within wound and again, the point of maximal count was found. Dissection continue until nodule was identified. The probe was placed in contact with the node and 200 counts were recorded. The node was excised in its entirety. Ex vivo, the node measured 230 counts when placed on the probe. The bed of the node measured less than 100 counts.  No  additional hot spots were identified. No clinically abnormal nodes were palpated. Both wounds irrigated, hemostasis was achieved and the wound closed in layers with  interrupted sutures of 3-0  Vicryl in deep dermal layer and a running subcuticular suture of Monocryl 4-0, then dressed with dermabond. The patient tolerated the procedure well and was taken to the postanesthesia care unit in stable condition. Sponge and instrument count correct at end of procedure.

## 2023-12-30 NOTE — Anesthesia Preprocedure Evaluation (Addendum)
 Anesthesia Evaluation  Patient identified by MRN, date of birth, ID band Patient awake    Reviewed: Allergy & Precautions, NPO status , Patient's Chart, lab work & pertinent test results  History of Anesthesia Complications Negative for: history of anesthetic complications  Airway Mallampati: I   Neck ROM: Full    Dental  (+) Missing   Pulmonary  Pulmonary fibrosis, on 2L home O2 as needed after activity   Pulmonary exam normal breath sounds clear to auscultation       Cardiovascular hypertension, +CHF  Normal cardiovascular exam+ Valvular Problems/Murmurs (moderate MR)  Rhythm:Regular Rate:Normal  ECG 12/22/23:  Normal sinus rhythm Right atrial enlargement Left axis deviation Low voltage QRS Possible Inferior infarct , age undetermined, appears unchanged from prior in 2023  Echo 03/01/23:  NORMAL LEFT VENTRICULAR SYSTOLIC FUNCTION  NORMAL RIGHT VENTRICULAR SYSTOLIC FUNCTION  TRIVIAL REGURGITATION NOTED  NO VALVULAR STENOSIS  GLS 17.6%  Hyperechoic small mass attached to the mid LA wall - appears separate from the wall structure   Myocardial perfusion 01/03/20:  Normal treadmill EKG without evidence of ischemia or arrhythmia  Normal myocardial perfusion without evidence of myocardial ischemia     Neuro/Psych negative neurological ROS     GI/Hepatic negative GI ROS,,,  Endo/Other  Obesity   Renal/GU negative Renal ROS     Musculoskeletal   Abdominal   Peds  Hematology Breast CA   Anesthesia Other Findings   Reproductive/Obstetrics                             Anesthesia Physical Anesthesia Plan  ASA: 3  Anesthesia Plan: General   Post-op Pain Management:    Induction: Intravenous  PONV Risk Score and Plan: 3 and Ondansetron , Dexamethasone and Treatment may vary due to age or medical condition  Airway Management Planned: LMA  Additional Equipment:   Intra-op Plan:    Post-operative Plan: Extubation in OR  Informed Consent: I have reviewed the patients History and Physical, chart, labs and discussed the procedure including the risks, benefits and alternatives for the proposed anesthesia with the patient or authorized representative who has indicated his/her understanding and acceptance.     Dental advisory given  Plan Discussed with: CRNA  Anesthesia Plan Comments: (Patient consented for risks of anesthesia including but not limited to:  - adverse reactions to medications - damage to eyes, teeth, lips or other oral mucosa - nerve damage due to positioning  - sore throat or hoarseness - damage to heart, brain, nerves, lungs, other parts of body or loss of life  Informed patient about role of CRNA in peri- and intra-operative care.  Patient voiced understanding.)        Anesthesia Quick Evaluation

## 2023-12-30 NOTE — Interval H&P Note (Signed)
 No change. OK to proceed.

## 2023-12-30 NOTE — Anesthesia Procedure Notes (Signed)
 Procedure Name: LMA Insertion Date/Time: 12/30/2023 10:09 AM  Performed by: Bill Budd, CRNAPre-anesthesia Checklist: Patient identified, Patient being monitored, Timeout performed, Emergency Drugs available and Suction available Patient Re-evaluated:Patient Re-evaluated prior to induction Oxygen Delivery Method: Circle system utilized Preoxygenation: Pre-oxygenation with 100% oxygen Induction Type: IV induction Ventilation: Mask ventilation without difficulty LMA: LMA inserted LMA Size: 4.0 Tube type: Oral Number of attempts: 1 Placement Confirmation: positive ETCO2 and breath sounds checked- equal and bilateral Tube secured with: Tape Dental Injury: Teeth and Oropharynx as per pre-operative assessment

## 2023-12-30 NOTE — Transfer of Care (Signed)
 Immediate Anesthesia Transfer of Care Note  Patient: Erika Schmidt  Procedure(s) Performed: BREAST LUMPECTOMY WITH RADIO FREQUENCY LOCALIZER (Left) BIOPSY, LYMPH NODE, SENTINEL, AXILLARY (Left)  Patient Location: PACU  Anesthesia Type:General  Level of Consciousness: drowsy  Airway & Oxygen Therapy: Patient Spontanous Breathing and Patient connected to face mask oxygen  Post-op Assessment: Report given to RN and Post -op Vital signs reviewed and stable  Post vital signs: Reviewed and stable  Last Vitals:  Vitals Value Taken Time  BP 121/65 12/30/23 1203  Temp    Pulse 79 12/30/23 1206  Resp 26 12/30/23 1206  SpO2 100 % 12/30/23 1206  Vitals shown include unfiled device data.  Last Pain:  Vitals:   12/30/23 0838  TempSrc: Temporal  PainSc: 0-No pain         Complications: No notable events documented.

## 2023-12-30 NOTE — Discharge Instructions (Signed)
Removal, Care After This sheet gives you information about how to care for yourself after your procedure. Your health care provider may also give you more specific instructions. If you have problems or questions, contact your health care provider. What can I expect after the procedure? After the procedure, it is common to have: Soreness. Bruising. Itching. Follow these instructions at home: site care Follow instructions from your health care provider about how to take care of your site. Make sure you: Wash your hands with soap and water before and after you change your bandage (dressing). If soap and water are not available, use hand sanitizer. Leave stitches (sutures), skin glue, or adhesive strips in place. These skin closures may need to stay in place for 2 weeks or longer. If adhesive strip edges start to loosen and curl up, you may trim the loose edges. Do not remove adhesive strips completely unless your health care provider tells you to do that. If the area bleeds or bruises, apply gentle pressure for 10 minutes. OK TO SHOWER IN 24HRS  Check your site every day for signs of infection. Check for: Redness, swelling, or pain. Fluid or blood. Warmth. Pus or a bad smell.  General instructions Rest and then return to your normal activities as told by your health care provider.  tylenol as needed for discomfort.    Use narcotics, if prescribed, only when tylenol is not enough to control pain.  325-'650mg'$  every 8hrs to max of '3000mg'$ /24hrs (including the '325mg'$  in every norco dose) for the tylenol.    Keep all follow-up visits as told by your health care provider. This is important. Contact a health care provider if: You have redness, swelling, or pain around your site. You have fluid or blood coming from your site. Your site feels warm to the touch. You have pus or a bad smell coming from your site. You have a fever. Your sutures, skin glue, or adhesive strips loosen or come off sooner  than expected. Get help right away if: You have bleeding that does not stop with pressure or a dressing. Summary After the procedure, it is common to have some soreness, bruising, and itching at the site. Follow instructions from your health care provider about how to take care of your site. Check your site every day for signs of infection. Contact a health care provider if you have redness, swelling, or pain around your site, or your site feels warm to the touch. Keep all follow-up visits as told by your health care provider. This is important. This information is not intended to replace advice given to you by your health care provider. Make sure you discuss any questions you have with your health care provider. Document Released: 08/23/2015 Document Revised: 01/24/2018 Document Reviewed: 01/24/2018 Elsevier Interactive Patient Education  [employer].

## 2023-12-30 NOTE — Anesthesia Postprocedure Evaluation (Signed)
 Anesthesia Post Note  Patient: Erika Schmidt  Procedure(s) Performed: BREAST LUMPECTOMY WITH RADIO FREQUENCY LOCALIZER (Left) BIOPSY, LYMPH NODE, SENTINEL, AXILLARY (Left)  Patient location during evaluation: PACU Anesthesia Type: General Level of consciousness: awake and alert, oriented and patient cooperative Pain management: pain level controlled Vital Signs Assessment: post-procedure vital signs reviewed and stable Respiratory status: spontaneous breathing, nonlabored ventilation and respiratory function stable Cardiovascular status: blood pressure returned to baseline and stable Postop Assessment: adequate PO intake Anesthetic complications: no   No notable events documented.   Last Vitals:  Vitals:   12/30/23 1203 12/30/23 1215  BP: 121/65 120/74  Pulse: 81 76  Resp: 11 (!) 25  Temp: (!) 36.1 C (!) 36.3 C  SpO2: 100% 99%    Last Pain:  Vitals:   12/30/23 1215  TempSrc:   PainSc: Asleep                 Dorothey Gate

## 2023-12-31 ENCOUNTER — Encounter: Payer: Self-pay | Admitting: Surgery

## 2024-01-04 LAB — SURGICAL PATHOLOGY

## 2024-01-10 ENCOUNTER — Encounter: Payer: Self-pay | Admitting: Radiation Oncology

## 2024-01-10 ENCOUNTER — Inpatient Hospital Stay: Attending: Oncology | Admitting: Oncology

## 2024-01-10 ENCOUNTER — Encounter: Payer: Self-pay | Admitting: Oncology

## 2024-01-10 ENCOUNTER — Ambulatory Visit
Admission: RE | Admit: 2024-01-10 | Discharge: 2024-01-10 | Disposition: A | Discharge status: Home / Self Care | Source: Ambulatory Visit | Attending: Radiation Oncology | Admitting: Radiation Oncology

## 2024-01-10 VITALS — BP 96/67 | HR 91 | Temp 98.3°F | Resp 16 | Ht 63.0 in | Wt 184.6 lb

## 2024-01-10 DIAGNOSIS — D0512 Intraductal carcinoma in situ of left breast: Secondary | ICD-10-CM | POA: Insufficient documentation

## 2024-01-10 DIAGNOSIS — Z170 Estrogen receptor positive status [ER+]: Secondary | ICD-10-CM | POA: Diagnosis not present

## 2024-01-10 DIAGNOSIS — C50412 Malignant neoplasm of upper-outer quadrant of left female breast: Secondary | ICD-10-CM | POA: Diagnosis not present

## 2024-01-10 DIAGNOSIS — M25612 Stiffness of left shoulder, not elsewhere classified: Secondary | ICD-10-CM | POA: Diagnosis not present

## 2024-01-10 DIAGNOSIS — Z510 Encounter for antineoplastic radiation therapy: Secondary | ICD-10-CM | POA: Diagnosis not present

## 2024-01-10 NOTE — Progress Notes (Signed)
 Patient said that she feels really weak, were she had her lumpectomy on her left breast she has having some drainage. I gave patient 2 liters of O2 while in our room.

## 2024-01-10 NOTE — Consult Note (Signed)
 NEW PATIENT EVALUATION  Name: Erika Schmidt  MRN: [id number]  Date:   01/10/2024     DOB: 1956-02-12   This 68 y.o. 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   PREVIOUS INVESTIGATIONS:  Mammograms and ultrasound reviewed Pathology reports reviewed Clinical notes reviewed  HPI: Patient is a 68 year old female who presented with an abnormal mammogram of the left breast with 2 concerning areas adjacent to each other.  Left breast also showed pleomorphic calcifications in a segmental distribution in the upper outer quadrant.  No left axillary adenopathy was identified.  Patient underwent left stereotactic guided biopsy of 2 sites with both showing ductal carcinoma in situ intermediate grade with associated calcifications and sclerosis.  One of the lesions was involved with an intraductal papilloma.  Patient went on to have a wide local excision and sentinel node biopsy showing 0.3 cm of grade 2 invasive ductal carcinoma as well as ductal carcinoma in situ.  1 sentinel lymph node was negative for metastatic disease.  Margins were clear at 1 cm for all margins.  She has done well postoperatively is still having some slight drainage from her sentinel node biopsy in the left axilla.  No and no indication of mastitis.  PLANNED TREATMENT REGIMEN: Left breast accelerated partial breast radiation  PAST MEDICAL HISTORY:  has a past medical history of Breast cancer (HCC), Bronchiectasis without complication (HCC), CHF (congestive heart failure) (HCC), Chronic hypoxemic respiratory failure (HCC), Dyspnea, Hypertension, ILD  (interstitial lung disease) (HCC), Pre-diabetes, and Pulmonary fibrosis (HCC).    PAST SURGICAL HISTORY:  Past Surgical History:  Procedure Laterality Date   ABDOMINAL HYSTERECTOMY     AXILLARY SENTINEL NODE BIOPSY Left 12/30/2023   Procedure: BIOPSY, LYMPH NODE, SENTINEL, AXILLARY;  Surgeon: Conrado Delay, DO;  Location: [facility];  Service: General;  Laterality: Left;   BREAST BIOPSY Left 12/01/2023   MM LT BREAST BX W LOC DEV 1ST LESION IMAGE BX SPEC STEREO GUIDE 12/01/2023 [facility]   BREAST BIOPSY Left 12/01/2023   MM LT BREAST BX W LOC DEV EA AD LESION IMG BX SPEC STEREO GUIDE 12/01/2023 [facility]   BREAST BIOPSY Left 12/01/2023   US  LT BREAST BX W LOC DEV 1ST LESION IMG BX SPEC US  GUIDE 12/01/2023 [facility]   BREAST BIOPSY Left 12/01/2023   US  LT BREAST BX W LOC DEV EA ADD LESION IMG BX SPEC US  GUIDE 12/01/2023 [facility]   BREAST BIOPSY  12/22/2023   MM LT BREAST SAVI/RF TAG EA ADD'L LESION MAMMO GUIDE 12/22/2023 [facility]   BREAST BIOPSY  12/22/2023   MM LT BREAST SAVI/RF TAG 1ST LESION MAMMO GUIDE 12/22/2023 [facility]   BREAST LUMPECTOMY WITH RADIO FREQUENCY LOCALIZER Left 12/30/2023   Procedure: BREAST LUMPECTOMY WITH RADIO FREQUENCY LOCALIZER;  Surgeon: Conrado Delay, DO;  Location: [facility];  Service: General;  Laterality: Left;   COLONOSCOPY WITH PROPOFOL  N/A 03/25/2015   Procedure: COLONOSCOPY WITH PROPOFOL ;  Surgeon: Deveron Fly, MD;  Location[facility];  Service: Endoscopy;  Laterality: N/A;   TEE WITHOUT CARDIOVERSION N/A 03/26/2023   Procedure: TRANSESOPHAGEAL ECHOCARDIOGRAM (TEE);  Surgeon: Isabell Manzanilla, DO;  Location: [facility];  Service:  Cardiovascular;  Laterality: N/A;    FAMILY HISTORY: family history includes Aneurysm in her paternal grandmother; Congestive Heart Failure in her sister; Murrell Arrant' disease in her sister; Heart attack in her brother and brother; Prostate cancer in her brother; Stroke in her father.  SOCIAL  HISTORY:  reports that she has never smoked. She has never used smokeless tobacco. She reports that she does not currently use alcohol. She reports that she does not use drugs.  ALLERGIES: Augmentin [amoxicillin-pot clavulanate] and Floxin [ofloxacin]  MEDICATIONS:  Current Outpatient Medications  Medication Sig Dispense Refill   acetaminophen  (TYLENOL ) 500 MG tablet Take 500 mg by mouth every 8 (eight) hours as needed.     albuterol  (VENTOLIN  HFA) 108 (90 Base) MCG/ACT inhaler Inhale 2 puffs into the lungs every 6 (six) hours as needed for wheezing or shortness of breath. 6.7 g 0   aspirin  81 MG tablet Take 1 tablet (81 mg total) by mouth daily. 30 tablet    atorvastatin  (LIPITOR) 40 MG tablet Take 40 mg by mouth daily.     Cholecalciferol 50 MCG (2000 UT) TABS Take 2,000 Units by mouth.     diltiazem (CARDIZEM) 120 MG tablet Take 120 mg by mouth.     fluticasone-salmeterol (WIXELA INHUB) 250-50 MCG/ACT AEPB Inhale 1 puff into the lungs in the morning and at bedtime.     furosemide  (LASIX ) 20 MG tablet Take 40 mg by mouth daily.     losartan (COZAAR) 50 MG tablet Take 50 mg by mouth daily.     metFORMIN (GLUCOPHAGE-XR) 500 MG 24 hr tablet Take 500 mg by mouth.     potassium chloride (K-DUR,KLOR-CON) 10 MEQ tablet Take 20 mEq by mouth daily.     predniSONE  (DELTASONE ) 2.5 MG tablet Take 2.5 mg by mouth daily with breakfast.     traMADol  (ULTRAM ) 50 MG tablet Take 1 tablet (50 mg total) by mouth every 8 (eight) hours as needed. (Patient not taking: Reported on 01/10/2024) 6 tablet 0   No current facility-administered medications for this encounter.    ECOG PERFORMANCE STATUS:  0 - Asymptomatic  REVIEW OF SYSTEMS: Patient denies any weight loss, fatigue, weakness, fever, chills or night sweats. Patient denies any loss of vision, blurred vision. Patient denies any ringing  of the ears or hearing loss. No irregular heartbeat. Patient denies heart murmur or history of fainting. Patient denies any  chest pain or pain radiating to her upper extremities. Patient denies any shortness of breath, difficulty breathing at night, cough or hemoptysis. Patient denies any swelling in the lower legs. Patient denies any nausea vomiting, vomiting of blood, or coffee ground material in the vomitus. Patient denies any stomach pain. Patient states has had normal bowel movements no significant constipation or diarrhea. Patient denies any dysuria, hematuria or significant nocturia. Patient denies any problems walking, swelling in the joints or loss of balance. Patient denies any skin changes, loss of hair or loss of weight. Patient denies any excessive worrying or anxiety or significant depression. Patient denies any problems with insomnia. Patient denies excessive thirst, polyuria, polydipsia. Patient denies any swollen glands, patient denies easy bruising or easy bleeding. Patient denies any recent infections, allergies or URI. Patient "s visual fields have not changed significantly in recent time.   PHYSICAL EXAM: BP 96/67   Pulse 91   Temp 98.3 F (36.8 C) (Tympanic)   Resp 16   Ht 5\' 3"  (1.6 m)   Wt 184 lb 9.6 oz (83.7 kg)   BMI 32.70  kg/m  Patient has a large pendulous breasts.  She status post wide local excision of the left breast which is healed well.  She has some slight drainage from her left axillary incision for sentinel node biopsy although the incision itself is healed well no dominant masses noted in either breast.  No axillary or supraclavicular adenopathy is identified.  Well-developed well-nourished patient in NAD. HEENT reveals PERLA, EOMI, discs not visualized.  Oral cavity is clear. No oral mucosal lesions are identified. Neck is clear without evidence of cervical or supraclavicular adenopathy. Lungs are clear to A&P. Cardiac examination is essentially unremarkable with regular rate and rhythm without murmur rub or thrill. Abdomen is benign with no organomegaly or masses noted. Motor sensory and  DTR levels are equal and symmetric in the upper and lower extremities. Cranial nerves II through XII are grossly intact. Proprioception is intact. No peripheral adenopathy or edema is identified. No motor or sensory levels are noted. Crude visual fields are within normal range.  LABORATORY DATA: Pathology reports reviewed    RADIOLOGY RESULTS: Ultrasound reviewed   IMPRESSION: Stage Ia invasive mammary carcinoma of the left breast status post wide local excision and sentinel node biopsy ER positive and 68 year old female  PLAN: Based her large pendulous breast would plan on delivering accelerated partial breast irradiation.  Would plan on delivering 35 Gray in 10 fractions using IMRT treatment planning and delivery.  Risks and benefits of treatment including skin reaction fatigue alteration of blood counts possible inclusion of superficial lung all were reviewed in detail with the patient.  I have personally set up and ordered CT simulation for next week to allow some further reasons resolution of her left axillary seroma.  Patient also will be candidate for endocrine therapy after completion of radiation.  Should Dr. Denese Finn opinion change with possible addition of systemic treatment would certainly sequence radiation after that although I believe with the small invasive component she will probably not be a candidate for systemic treatment.  Patient and daughter comprehend the recommendations well.  I would like to take this opportunity to thank you for allowing me to participate in the care of your patient.Glenis Langdon, MD

## 2024-01-10 NOTE — Progress Notes (Signed)
 Hematology/Oncology Consult note [facility]  [phone] [phone]  Patient Care Team: Rex Castor, MD as PCP - General (Internal Medicine) Avonne Boettcher, MD as Consulting Physician (Oncology) Glenis Langdon, MD as Consulting Physician (Radiation Oncology)   Name of the patient: Erika Schmidt  [id number]  [birth year]   Date of visit: 01/10/24  Diagnosis-left breast DCIS ER positive  Chief complaint/ Reason for visit-discuss final pathology results and further management  Heme/Onc history: Patient is a 68 year old female diagnosed with left breast DCIS ER positive based on the mammogram in April 2025.diagnostic mammogram and an ultrasound which showed calcifications spanning 9 cm in the AP dimension and 5.3 cm in the transverse dimensions. There were 2 hypoechoic masses noted at the 1 o'clock position of the left breast 13 cm and 15 cm from the nipple measuring 1.2 x 1 x 0.8 and 0.9 x 0.8 x 0.8 cm respectively. Left axillary ultrasound showed benign lymph nodes. 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  1 lymph node negative for metastatic carcinoma.  There was also associated intermediate grade DCIS invasive mammary carcinoma was 100% ER positive, PR negative, Ki-67 2%.  HER2 was equivocal by IHC and FISH studies are currently pending  Interval history-patient feels sore at the site of lumpectomy and that is minimal serosanguineous discharge at the site of surgical incision.  She is also met with radiation oncology today who will consider starting treatment in about 2 weeks time after her lumpectomy wound has healed.  ECOG PS- 1 Pain scale- 0   Review of systems-  Review of Systems  Constitutional:  Negative for chills, fever, malaise/fatigue and weight loss.  HENT:  Negative for congestion, ear discharge and nosebleeds.   Eyes:  Negative for blurred vision.  Respiratory:  Negative for cough, hemoptysis, sputum production, shortness of breath and wheezing.   Cardiovascular:  Negative for chest pain, palpitations, orthopnea and claudication.  Gastrointestinal:  Negative for abdominal pain, blood in stool, constipation, diarrhea, heartburn, melena, nausea and vomiting.  Genitourinary:  Negative for dysuria, flank pain, frequency, hematuria and urgency.  Musculoskeletal:  Negative for back pain, joint pain and myalgias.  Skin:  Negative for rash.  Neurological:  Negative for dizziness, tingling, focal weakness, seizures, weakness and headaches.  Endo/Heme/Allergies:  Does not bruise/bleed easily.  Psychiatric/Behavioral:  Negative for depression and suicidal ideas. The patient does not have insomnia.       Allergies  Allergen Reactions   Augmentin [Amoxicillin-Pot Clavulanate] Diarrhea   Floxin [Ofloxacin]      Past Medical History:  Diagnosis Date   Breast cancer (HCC)    Bronchiectasis without complication (HCC)    CHF (congestive heart failure) (HCC)    Chronic hypoxemic respiratory failure (HCC)    Dyspnea    Hypertension    ILD (interstitial lung disease) (HCC)    Pre-diabetes    Pulmonary fibrosis (HCC)      Past Surgical History:  Procedure Laterality Date   ABDOMINAL HYSTERECTOMY     AXILLARY SENTINEL NODE BIOPSY Left 12/30/2023   Procedure: BIOPSY, LYMPH NODE, SENTINEL, AXILLARY;  Surgeon: Conrado Delay, DO;  Location: [facility];  Service: General;  Laterality: Left;   BREAST BIOPSY Left 12/01/2023   MM LT BREAST BX W LOC DEV 1ST  LESION IMAGE BX SPEC STEREO GUIDE 12/01/2023 [facility]   BREAST BIOPSY Left 12/01/2023   MM LT BREAST BX W LOC DEV EA AD LESION IMG BX SPEC STEREO GUIDE 12/01/2023 [facility]   BREAST BIOPSY  Left 12/01/2023   US  LT BREAST BX W LOC DEV 1ST LESION IMG BX SPEC US  GUIDE 12/01/2023 [facility]   BREAST BIOPSY Left 12/01/2023   US  LT BREAST BX W LOC DEV EA ADD LESION IMG BX SPEC US  GUIDE 12/01/2023 [facility]   BREAST BIOPSY  12/22/2023   MM LT BREAST SAVI/RF TAG EA ADD'L LESION MAMMO GUIDE 12/22/2023 [facility]   BREAST BIOPSY  12/22/2023   MM LT BREAST SAVI/RF TAG 1ST LESION MAMMO GUIDE 12/22/2023 [facility]   BREAST LUMPECTOMY WITH RADIO FREQUENCY LOCALIZER Left 12/30/2023   Procedure: BREAST LUMPECTOMY WITH RADIO FREQUENCY LOCALIZER;  Surgeon: Conrado Delay, DO;  Location: [facility];  Service: General;  Laterality: Left;   COLONOSCOPY WITH PROPOFOL  N/A 03/25/2015   Procedure: COLONOSCOPY WITH PROPOFOL ;  Surgeon: Deveron Fly, MD;  Location: [facility];  Service: Endoscopy;  Laterality: N/A;   TEE WITHOUT CARDIOVERSION N/A 03/26/2023   Procedure: TRANSESOPHAGEAL ECHOCARDIOGRAM (TEE);  Surgeon: Isabell Manzanilla, DO;  Location: [facility];  Service: Cardiovascular;  Laterality: N/A;    Social History   Socioeconomic History   Marital status: Married    Spouse name: Chief Financial Officer   Number of children: 2   Years of education: Not on file   Highest education level: Not on file  Occupational History   Not on file  Tobacco Use   Smoking status: Never   Smokeless tobacco: Never  Vaping Use   Vaping status: Never Used  Substance and Sexual Activity   Alcohol use: Not Currently    Comment: Occasionally   Drug use: Never   Sexual activity: Not Currently  Other Topics Concern   Not on file  Social History Narrative   Patient has 7 grandchildren & 3 great-grandchildren!    Social Drivers of Health   Financial Resource Strain: Medium Risk (12/06/2023)   Received from [facility]   Overall Financial Resource Strain (CARDIA)    Difficulty of Paying Living Expenses: Somewhat hard  Food Insecurity: Food Insecurity Present (12/06/2023)   Received from  [facility]   Hunger Vital Sign    Worried About Running Out of Food in the Last Year: Sometimes true    Ran Out of Food in the Last Year: Sometimes true  Transportation Needs: No Transportation Needs (12/06/2023)   PRAPARE - Administrator, Civil Service (Medical): No    Lack of Transportation (Non-Medical): No  Physical Activity: Not on file  Stress: Not on file  Social Connections: Not on file  Intimate Partner Violence: Not At Risk (12/06/2023)   Humiliation, Afraid, Rape, and Kick questionnaire    Fear of Current or Ex-Partner: No    Emotionally Abused: No    Physically Abused: No    Sexually Abused: No    Family History  Problem Relation Age of Onset   Stroke Father    Congestive Heart Failure Sister    Graves' disease Sister    Prostate cancer Brother    Heart attack Brother    Heart attack Brother    Aneurysm Paternal Grandmother    Breast cancer Neg Hx      Current Outpatient Medications:    acetaminophen  (TYLENOL ) 500 MG tablet, Take 500 mg by mouth every 8 (eight) hours as needed., Disp: , Rfl:  albuterol  (VENTOLIN  HFA) 108 (90 Base) MCG/ACT inhaler, Inhale 2 puffs into the lungs every 6 (six) hours as needed for wheezing or shortness of breath., Disp: 6.7 g, Rfl: 0   aspirin  81 MG tablet, Take 1 tablet (81 mg total) by mouth daily., Disp: 30 tablet, Rfl:    atorvastatin  (LIPITOR) 40 MG tablet, Take 40 mg by mouth daily., Disp: , Rfl:    Cholecalciferol 50 MCG (2000 UT) TABS, Take 2,000 Units by mouth., Disp: , Rfl:    diltiazem (CARDIZEM) 120 MG tablet, Take 120 mg by mouth., Disp: , Rfl:    fluticasone-salmeterol (WIXELA INHUB) 250-50 MCG/ACT AEPB, Inhale 1 puff into the lungs in the morning and at bedtime., Disp: , Rfl:    furosemide  (LASIX ) 20 MG tablet, Take 40 mg by mouth daily., Disp: , Rfl:    losartan (COZAAR) 50 MG tablet, Take 50 mg by mouth daily., Disp: , Rfl:    metFORMIN (GLUCOPHAGE-XR) 500 MG 24 hr tablet, Take 500 mg  by mouth., Disp: , Rfl:    potassium chloride (K-DUR,KLOR-CON) 10 MEQ tablet, Take 20 mEq by mouth daily., Disp: , Rfl:    predniSONE  (DELTASONE ) 2.5 MG tablet, Take 2.5 mg by mouth daily with breakfast., Disp: , Rfl:    traMADol  (ULTRAM ) 50 MG tablet, Take 1 tablet (50 mg total) by mouth every 8 (eight) hours as needed. (Patient not taking: Reported on 01/10/2024), Disp: 6 tablet, Rfl: 0  Physical exam: There were no vitals filed for this visit. Physical Exam Cardiovascular:     Rate and Rhythm: Normal rate and regular rhythm.     Heart sounds: Normal heart sounds.  Pulmonary:     Effort: Pulmonary effort is normal.     Breath sounds: Normal breath sounds.  Skin:    General: Skin is warm and dry.  Neurological:     Mental Status: She is alert and oriented to person, place, and time.   Breast exam: Patient is s/p left lumpectomy and there is induration at the site of surgery.  There is minimal serosanguineous discharge noted from the lumpectomy incision site.  I have personally reviewed labs listed below:    Latest Ref Rng & Units 12/22/2023    2:07 PM  CMP  Glucose 70 - 99 mg/dL 962   BUN 8 - 23 mg/dL 12   Creatinine 9.52 - 1.00 mg/dL 8.41   Sodium 324 - 401 mmol/L 139   Potassium 3.5 - 5.1 mmol/L 3.5   Chloride 98 - 111 mmol/L 102   CO2 22 - 32 mmol/L 26   Calcium  8.9 - 10.3 mg/dL 9.1       Latest Ref Rng & Units 12/22/2023    2:07 PM  CBC  WBC 4.0 - 10.5 K/uL 11.2   Hemoglobin 12.0 - 15.0 g/dL 02.7   Hematocrit 25.3 - 46.0 % 51.0   Platelets 150 - 400 K/uL 314    I have personally reviewed Radiology images listed below: No images are attached to the encounter.  MM Breast Surgical Specimen Result Date: 12/30/2023 CLINICAL DATA:  Status post [facility] localized left breast lumpectomy. EXAM: SPECIMEN RADIOGRAPH OF THE LEFT BREAST COMPARISON:  Previous exam(s). FINDINGS: Status post excision of the left breast. TWO [facility] reflectors and a ribbon, heart, and coil shaped  clips are present within the specimen. IMPRESSION: Specimen radiograph of the left breast. Electronically Signed   By: Sande Cromer M.D.   On: 12/30/2023 11:41   NM Sentinel Node Inj-No Rpt (  Breast) Result Date: 12/30/2023 Lymphoseek was injected by the Nuclear Medicine Technologist for sentinel lymph node localization.   MM LT BREAST SAVI/RF TAG 1ST LESION MAMMO GUIDE Result Date: 12/22/2023 CLINICAL DATA:  Patient is status post 3 site biopsy demonstrating DCIS (RIBBON clip, heart clip and COIL clip). X clip marks a benign biopsy. Patient presents for preoperative bracketing. EXAM: NEEDLE LOCALIZATION OF THE LEFT BREAST WITH MAMMO GUIDANCE x2 COMPARISON:  Previous exam(s). FINDINGS: Patient presents for bracketing needle localization prior to lumpectomy. I met with the patient and we discussed the procedure of needle localization including benefits and alternatives. We discussed the high likelihood of a successful procedure. We discussed the risks of the procedure, including infection, bleeding, tissue injury, and further surgery. Informed, written consent was given. The usual time-out protocol was performed immediately prior to the procedure. Posterior extent: COIL Using tomosynthesis mammographic stereotactic guidance, sterile technique, 1% lidocaine  and a 10 cm SAVI SCOUT needle, the COIL clip was localized using a superior approach. Anterior extent: RIBBON During targeting, a small post biopsy hematoma was noted to be in association with the RIBBON biopsy marking clip. As such, just anterior to the hematoma was targeted. Using tomosynthesis mammographic stereotactic guidance, sterile technique, 1% lidocaine  and a 10 cm SAVI SCOUT needle, anterior to the small post biopsy hematoma containing the RIBBON clip was localized using a superior approach. Subsequent two-view mammogram was performed. The images were marked for Dr. Rosea Conch. IMPRESSION: Bracketing Radar reflector localization of the LEFT breast.  No apparent complications. Bracketing information was conveyed to Dr. Rosea Conch via epic message at time of localization Electronically Signed   By: Clancy Crimes M.D.   On: 12/22/2023 15:56   MM LT BREAST SAVI/RF TAG EA ADD'L LESION MAMMO GUIDE Result Date: 12/22/2023 CLINICAL DATA:  Patient is status post 3 site biopsy demonstrating DCIS (RIBBON clip, heart clip and COIL clip). X clip marks a benign biopsy. Patient presents for preoperative bracketing. EXAM: NEEDLE LOCALIZATION OF THE LEFT BREAST WITH MAMMO GUIDANCE x2 COMPARISON:  Previous exam(s). FINDINGS: Patient presents for bracketing needle localization prior to lumpectomy. I met with the patient and we discussed the procedure of needle localization including benefits and alternatives. We discussed the high likelihood of a successful procedure. We discussed the risks of the procedure, including infection, bleeding, tissue injury, and further surgery. Informed, written consent was given. The usual time-out protocol was performed immediately prior to the procedure. Posterior extent: COIL Using tomosynthesis mammographic stereotactic guidance, sterile technique, 1% lidocaine  and a 10 cm SAVI SCOUT needle, the COIL clip was localized using a superior approach. Anterior extent: RIBBON During targeting, a small post biopsy hematoma was noted to be in association with the RIBBON biopsy marking clip. As such, just anterior to the hematoma was targeted. Using tomosynthesis mammographic stereotactic guidance, sterile technique, 1% lidocaine  and a 10 cm SAVI SCOUT needle, anterior to the small post biopsy hematoma containing the RIBBON clip was localized using a superior approach. Subsequent two-view mammogram was performed. The images were marked for Dr. Rosea Conch. IMPRESSION: Bracketing Radar reflector localization of the LEFT breast. No apparent complications. Bracketing information was conveyed to Dr. Rosea Conch via epic message at time of localization Electronically  Signed   By: Clancy Crimes M.D.   On: 12/22/2023 15:56     Assessment and plan- Patient is a 68 y.o. female With history of left breast DCIS ER positive s/p lumpectomy here to discuss final pathology results and further management  Patient underwent left lumpectomy for what was  thought to be DCIS prior to surgery.  However she was noted to have a 0.3 cm grade 2 invasive mammary carcinoma.  Margins were negative and 1 sentinel lymph node was negative for malignancy as well.  Hormone receptor testing was repeated and she was ER 95% positive PR 0% positive with a Ki-67 of 2%.  HER2 testing was equivocal by IHC and FISH testing results are currently pending.  We will call her with the results of HER2 testing.  With a Ki-67 of 2% I doubt that her HER2 testing will be positive.  Also given her comorbidities I may not offer her adjuvant HER2 based treatment for a 0.3 cm tumor.  If she is HER2 negative she does not require Oncotype testing for grade 2 tumor that is less than 0.5 cm.  At this time she needs to heal from her lumpectomy followed by adjuvant radiation therapy.  She has an upcoming appointment with Dr. Rosea Conch in 2 days time as well.  Given that her tumor is ER positive there would be role for adjuvant endocrine therapy.Given that her tumor was ER PR positive hormone therapy is indicated at this time.  Idiscussed the role for hormone therapy. Given that she is postmenopausal I would favor 5 years of adjuvant hormone therapy with aromatase inhibitor. I discussed the risks and benefits of letrozole including all but not limited to fatigue, hypercholesterolemia, hot flashes, arthralgias and worsening bone health.  Patient will also need to be on calcium  1200 mg along with vitamin D 800 international units.  We will obtain a baseline bone density scan written information about Letrozole given to the patient. I would like her to finish radiation therapy and start hormone therapy thereafter. Patient  verbalized understanding and agrees to proceed   I will see her in 3 months with a bone density scan prior   Cancer Staging  Ductal carcinoma in situ (DCIS) of left breast Staging form: Breast, AJCC 8th Edition - Clinical stage from 12/06/2023: Stage 0 (cTis (DCIS), cN0, cM0, G2, ER+, PR: Not Assessed, HER2: Not Assessed) - Signed by Avonne Boettcher, MD on 12/06/2023 Nuclear grade: G2 Histologic grading system: 3 grade system  Malignant neoplasm of upper-outer quadrant of left breast in female, estrogen receptor positive (HCC) Staging form: Breast, AJCC 8th Edition - Pathologic stage from 01/10/2024: Stage IA (pT1a, pN0, cM0, G2, ER+, PR-, HER2: Equivocal) - Signed by Avonne Boettcher, MD on 01/10/2024 Stage prefix: Initial diagnosis Multigene prognostic tests performed: None Histologic grading system: 3 grade system        Visit Diagnosis 1. Malignant neoplasm of upper-outer quadrant of left breast in female, estrogen receptor positive (HCC)      Dr. Seretha Dance, MD, MPH [facility] [place] [phone] 01/10/2024 1:43 PM

## 2024-01-11 ENCOUNTER — Encounter: Payer: Self-pay | Admitting: *Deleted

## 2024-01-11 ENCOUNTER — Encounter: Payer: Self-pay | Admitting: Oncology

## 2024-01-11 NOTE — Progress Notes (Signed)
 Patient notified that HER2 result came back negative.  Next steps will be radiation.

## 2024-01-18 ENCOUNTER — Encounter: Payer: Self-pay | Admitting: *Deleted

## 2024-01-18 ENCOUNTER — Ambulatory Visit
Admission: RE | Admit: 2024-01-18 | Discharge: 2024-01-18 | Disposition: A | Discharge status: Home / Self Care | Source: Ambulatory Visit | Attending: Radiation Oncology | Admitting: Radiation Oncology

## 2024-01-18 DIAGNOSIS — Z510 Encounter for antineoplastic radiation therapy: Secondary | ICD-10-CM | POA: Diagnosis not present

## 2024-01-19 ENCOUNTER — Ambulatory Visit: Attending: Oncology | Admitting: Occupational Therapy

## 2024-01-19 ENCOUNTER — Encounter: Payer: Self-pay | Admitting: Occupational Therapy

## 2024-01-19 DIAGNOSIS — M25612 Stiffness of left shoulder, not elsewhere classified: Secondary | ICD-10-CM | POA: Diagnosis present

## 2024-01-19 DIAGNOSIS — Z510 Encounter for antineoplastic radiation therapy: Secondary | ICD-10-CM | POA: Diagnosis not present

## 2024-01-19 NOTE — Therapy (Addendum)
 OUTPATIENT OCCUPATIONAL THERAPY BREAST CANCER POST OP EVALUATION   Patient Name: Erika Schmidt MRN: [id number] DOB:11/05/1955, 68 y.o., female Today's Date: 01/19/2024  END OF SESSION:  OT End of Session - 01/19/24 1906     Visit Number 1    Number of Visits 6    Date for OT Re-Evaluation 03/01/24    OT Start Time 0901    OT Stop Time 0930    OT Time Calculation (min) 29 min    Activity Tolerance Patient tolerated treatment well    Behavior During Therapy WFL for tasks assessed/performed             Past Medical History:  Diagnosis Date   Breast cancer (HCC)    Bronchiectasis without complication (HCC)    CHF (congestive heart failure) (HCC)    Chronic hypoxemic respiratory failure (HCC)    Dyspnea    Hypertension    ILD (interstitial lung disease) (HCC)    Pre-diabetes    Pulmonary fibrosis (HCC)    Past Surgical History:  Procedure Laterality Date   ABDOMINAL HYSTERECTOMY     AXILLARY SENTINEL NODE BIOPSY Left 12/30/2023   Procedure: BIOPSY, LYMPH NODE, SENTINEL, AXILLARY;  Surgeon: Conrado Delay, DO;  Location: [facility];  Service: General;  Laterality: Left;   BREAST BIOPSY Left 12/01/2023   MM LT BREAST BX W LOC DEV 1ST LESION IMAGE BX SPEC STEREO GUIDE 12/01/2023 [facility]   BREAST BIOPSY Left 12/01/2023   MM LT BREAST BX W LOC DEV EA AD LESION IMG BX SPEC STEREO GUIDE 12/01/2023 [facility]   BREAST BIOPSY Left 12/01/2023   US  LT BREAST BX W LOC DEV 1ST LESION IMG BX SPEC US  GUIDE 12/01/2023 [facility]   BREAST BIOPSY Left 12/01/2023   US  LT BREAST BX W LOC DEV EA ADD LESION IMG BX SPEC US  GUIDE 12/01/2023 [facility]   BREAST BIOPSY  12/22/2023   MM LT BREAST SAVI/RF TAG EA ADD'L LESION MAMMO GUIDE 12/22/2023 [facility]   BREAST BIOPSY  12/22/2023   MM LT BREAST SAVI/RF TAG 1ST LESION MAMMO GUIDE 12/22/2023 [facility]   BREAST LUMPECTOMY WITH RADIO FREQUENCY LOCALIZER Left 12/30/2023   Procedure: BREAST LUMPECTOMY WITH RADIO  FREQUENCY LOCALIZER;  Surgeon: Conrado Delay, DO;  Location: [facility];  Service: General;  Laterality: Left;   COLONOSCOPY WITH PROPOFOL  N/A 03/25/2015   Procedure: COLONOSCOPY WITH PROPOFOL ;  Surgeon: Deveron Fly, MD;  Location: [facility];  Service: Endoscopy;  Laterality: N/A;   TEE WITHOUT CARDIOVERSION N/A 03/26/2023   Procedure: TRANSESOPHAGEAL ECHOCARDIOGRAM (TEE);  Surgeon: Isabell Manzanilla, DO;  Location: [facility];  Service: Cardiovascular;  Laterality: N/A;   Patient Active Problem List   Diagnosis Date Noted   Malignant neoplasm of upper-outer quadrant of left breast in female, estrogen receptor positive (HCC) 01/10/2024   Ductal carcinoma in situ (DCIS) of left breast 12/06/2023   Pneumonia due to COVID-19 virus 06/22/2019   COVID-19 virus infection 06/22/2019   Benign essential hypertension 03/05/2015   Mixed hyperlipidemia 03/05/2015   Moderate mitral insufficiency 03/05/2015   History of hypertension 02/05/2015    PCP: Dr Primus Brookes  REFERRING PROVIDER: Dr Clemente Cushing DIAG: L breast CA with lumpectomy  THERAPY DIAG:  Stiffness of left shoulder, not elsewhere classified  Rationale for Evaluation and Treatment: Rehabilitation  ONSET DATE: 12/24/23  SUBJECTIVE:  SUBJECTIVE STATEMENT: Patient reports she is here today after being refer by one of her medical team for left breast cancer postlumpectomy.  Starting radiation soon.  I have been trying to move my arm around.  But the bandage in my way little bit. PERTINENT HISTORY:  Patient was diagnosed with left  breast cancer -she had a left lumpectomy by Dr. Rosea Conch on 12/30/2023.  3 lymph nodes removed.  Patient did develop a left axilla abscess that was drained by Dr. Rosea Conch on 01/12/2024.  Family is changing bandages.  And patient was  put on antibiotic.  Per patient she finished today.  Radiation starting  PATIENT GOALS:   reduce lymphedema risk and learn post op HEP.   PAIN:  Are you having pain? No  PRECAUTIONS: Patient had abscess drained on 01/12/2024 and left axilla   HAND DOMINANCE: left  WEIGHT BEARING RESTRICTIONS: No  FALLS:  Has patient fallen in last 6 months? No  LIVING ENVIRONMENT: Patient lives with: Alone but staying with family at the moment  OCCUPATION: Patient works part-time as a Comptroller. Patient reports she is a homebody likes to watch TV and she does her own housework     OBJECTIVE:  COGNITION: Overall cognitive status: Within functional limits for tasks assessed    POSTURE:  rounded shoulders posture  UPPER EXTREMITY AROM/PROM: Right upper extremity shoulder range of motion within normal limits. Left shoulder range of motion 110 degrees flexion and abduction 95 . External rotation limited to 75 degrees. Patient continues to have a bandage in left axilla limiting endrange motion Range of motion improved after review of home exercises at end of session     UPPER EXTREMITY STRENGTH: Right within functional limits.  Left not tested.  Patient is 3 weeks postop.    LYMPHEDEMA/ONCOLOGY QUESTIONNAIRE - 01/19/24 0001       Right Upper Extremity Lymphedema   15 cm Proximal to Olecranon Process 36.5 cm    10 cm Proximal to Olecranon Process 35 cm    Olecranon Process 26 cm    15 cm Proximal to Ulnar Styloid Process 24.4 cm    10 cm Proximal to Ulnar Styloid Process 20.5 cm    Just Proximal to Ulnar Styloid Process 15.3 cm      Left Upper Extremity Lymphedema   15 cm Proximal to Olecranon Process 34.2 cm    10 cm Proximal to Olecranon Process 35.5 cm    Olecranon Process 26 cm    15 cm Proximal to Ulnar Styloid Process 25 cm    10 cm Proximal to Ulnar Styloid Process 21 cm    Just Proximal to Ulnar Styloid Process 15.5 cm             L-DEX LYMPHEDEMA SCREENING:  The  patient was assessed using the L-Dex machine today to produce a lymphedema index baseline score. The patient will be reassessed on a regular basis (typically every 3 months) to obtain new L-Dex scores. If the score is > 6.5 points away from his/her baseline score indicating onset of subclinical lymphedema, it will be recommended to wear a compression garment for 4 weeks, 12 hours per day and then be reassessed. If the score continues to be > 6.5 points from baseline at reassessment, we will initiate lymphedema treatment. Assessing in this manner has a 95% rate of preventing clinically significant lymphedema.   L-DEX FLOWSHEETS - 01/19/24 1900       L-DEX LYMPHEDEMA SCREENING   Measurement Type Unilateral    L-DEX  MEASUREMENT EXTREMITY Upper Extremity    POSITION  Seated    DOMINANT SIDE Left    At Risk Side Left    BASELINE SCORE (UNILATERAL) 7.5               PATIENT EDUCATION:  Education details: Lymphedema risk reduction info provided and post op shoulder/posture HEP Person educated: Patient Education method: Explanation, Demonstration, Handout Education comprehension: Patient verbalized understanding and returned demonstration  HOME EXERCISE PROGRAM: Patient and niece was instructed today in a home exercise program today for post op shoulder range of motion. These included active assist shoulder flexion and abduction and supine as well as external rotation maxing with gravity.  3 times a day 10-15 reps pain-free.  One of the session should be before she comes in for radiation.  Patient to continue with range of motion throughout radiation. , scapular retraction can do that several times during the day.  She was encouraged to do these 2-3 x day, holding 3 seconds and repeating 10 times when permitted by her physician/surgeon.   ASSESSMENT:  CLINICAL IMPRESSION: Patient present at OT evaluation postop left lumpectomy.  Patient is starting radiation soon.  Patient lives alone but  staying with family at the moment.  Per patient and family 3 lymph nodes removed.  Lumpectomy was done by Dr. Rosea Conch on 12/30/2023.  Patient did develop a abscess that had to be drained on 01/11/2021. 5.  Patient was put on antibiotic.  Family is changing bandages.  Patient limited in endrange of motion on the left shoulder in all range.  Patient and family was educated in home program for active assisted range of motion.  Patient to follow-up with me at the start of radiation.  Circumference and L-Dex score within normal range. She will benefit from OT reassessment progressing range of motion and from L-Dex screens every 3 months for 2 years to detect subclinical lymphedema.  Pt will benefit from skilled therapeutic intervention to improve on the following deficits: Decreased knowledge of precautions and lymphedema education, impaired UE functional use, pain, decreased ROM, postural dysfunction.   OT treatment/interventions: ADL/self-care home management, pt/family education, therapeutic exercise,manual therapy  REHAB POTENTIAL: Good  CLINICAL DECISION MAKING: Stable/uncomplicated  EVALUATION COMPLEXITY: Low   GOALS: Goals reviewed with patient? YES  LONG TERM GOALS: (STG=LTG)    Name Target Date Goal status  1 Pt will be able to verbalize understanding of pertinent lymphedema risk reduction practices relevant to her dx specifically related to skin care.  Baseline:  No knowledge 04/12/24 Initial  2 Pt will be able to return demo and/or verbalize understanding of the post op HEP related to regaining shoulder ROM. Baseline:  No knowledge Today Achieved at eval       4 Pt will demo she has regained full shoulder ROM and function post operatively compared to baselines.  Baseline: See objective measurements taken today. 12 weeks Initial    PLAN:      OT FREQUENCY/DURATION: EVAL and 5 follow up appointments  Occupational Therapy Information for After Breast Cancer  Surgery/Treatment:  Lymphedema is a swelling condition that you may be at risk for in your arm if you have lymph nodes removed from the armpit area.  After a sentinel node biopsy, the risk is approximately 5-9% and is higher after an axillary node dissection.  There is treatment available for this condition and it is not life-threatening.  Contact your physician or occupational therapist with concerns. You may begin the 4 shoulder/posture exercises (see additional  sheet) when permitted by your physician (typically a week after surgery).  If you have drains, you may need to wait until those are removed before beginning range of motion exercises.  A general recommendation is to not lift your arms above shoulder height until drains are removed.  These exercises should be done to your tolerance and gently.  This is not a no pain/no gain type of recovery so listen to your body and stretch into the range of motion that you can tolerate, stopping if you have pain.  If you are having immediate reconstruction, ask your plastic surgeon about doing exercises as he or she may want you to wait. .  While undergoing any medical procedure or treatment, try to avoid blood pressure being taken or needle sticks from occurring on the arm on the side of cancer.   This recommendation begins after surgery and continues for the rest of your life.  This may help reduce your risk of getting lymphedema (swelling in your arm). An excellent resource for those seeking information on lymphedema is the [employer]'s web site. It can be accessed at www.lymphnet.org If you notice swelling in your hand, arm or breast at any time following surgery (even if it is many years from now), please contact your doctor or occupational therapist to discuss this.  Lymphedema can be treated at any time but it is easier for you if it is treated early on.  If you feel like your shoulder motion is not returning to normal in a reasonable  amount of time, please contact your surgeon or occupational therapist.  [facility] [phone]. [address] [place], [place] [address]  Patient was instructed today in a home exercise program today for post op shoulder range of motion. These included active assist shoulder flexion in standing/supine, scapular retraction, wall walking/slides with shoulder abduction, and hands behind head external rotation in supine.  She was encouraged to do these 2-3 x day, holding 3 seconds and repeating 10 times when permitted by her physician/surgeon      Heloise Lobo, OTR/L,CLT 01/19/2024, 7:27 PM

## 2024-01-20 ENCOUNTER — Other Ambulatory Visit: Payer: Self-pay | Admitting: *Deleted

## 2024-01-20 DIAGNOSIS — D0512 Intraductal carcinoma in situ of left breast: Secondary | ICD-10-CM

## 2024-01-26 ENCOUNTER — Ambulatory Visit
Admission: RE | Admit: 2024-01-26 | Discharge: 2024-01-26 | Disposition: A | Discharge status: Home / Self Care | Source: Ambulatory Visit | Attending: Radiation Oncology | Admitting: Radiation Oncology

## 2024-01-26 ENCOUNTER — Inpatient Hospital Stay: Admitting: Occupational Therapy

## 2024-01-26 DIAGNOSIS — Z510 Encounter for antineoplastic radiation therapy: Secondary | ICD-10-CM | POA: Diagnosis not present

## 2024-01-26 DIAGNOSIS — M25612 Stiffness of left shoulder, not elsewhere classified: Secondary | ICD-10-CM

## 2024-01-26 DIAGNOSIS — C50412 Malignant neoplasm of upper-outer quadrant of left female breast: Secondary | ICD-10-CM | POA: Diagnosis not present

## 2024-01-26 NOTE — Therapy (Signed)
 OUTPATIENT OCCUPATIONAL THERAPY BREAST CANCER POST OP TREATMENT   Patient Name: Erika Schmidt MRN: [id number] DOB:20-Jun-1956, 68 y.o., female Today's Date: 01/26/2024  END OF SESSION:  OT End of Session - 01/26/24 1642     Visit Number 2    Number of Visits 6    Date for OT Re-Evaluation 04/12/24    OT Start Time 1210    OT Stop Time 1240    OT Time Calculation (min) 30 min    Activity Tolerance Patient tolerated treatment well    Behavior During Therapy WFL for tasks assessed/performed          Past Medical History:  Diagnosis Date   Breast cancer (HCC)    Bronchiectasis without complication (HCC)    CHF (congestive heart failure) (HCC)    Chronic hypoxemic respiratory failure (HCC)    Dyspnea    Hypertension    ILD (interstitial lung disease) (HCC)    Pre-diabetes    Pulmonary fibrosis (HCC)    Past Surgical History:  Procedure Laterality Date   ABDOMINAL HYSTERECTOMY     AXILLARY SENTINEL NODE BIOPSY Left 12/30/2023   Procedure: BIOPSY, LYMPH NODE, SENTINEL, AXILLARY;  Surgeon: Conrado Delay, DO;  Location: [facility];  Service: General;  Laterality: Left;   BREAST BIOPSY Left 12/01/2023   MM LT BREAST BX W LOC DEV 1ST LESION IMAGE BX SPEC STEREO GUIDE 12/01/2023 [facility]   BREAST BIOPSY Left 12/01/2023   MM LT BREAST BX W LOC DEV EA AD LESION IMG BX SPEC STEREO GUIDE 12/01/2023 [facility]   BREAST BIOPSY Left 12/01/2023   US  LT BREAST BX W LOC DEV 1ST LESION IMG BX SPEC US  GUIDE 12/01/2023 [facility]   BREAST BIOPSY Left 12/01/2023   US  LT BREAST BX W LOC DEV EA ADD LESION IMG BX SPEC US  GUIDE 12/01/2023 [facility]   BREAST BIOPSY  12/22/2023   MM LT BREAST SAVI/RF TAG EA ADD'L LESION MAMMO GUIDE 12/22/2023 [facility]   BREAST BIOPSY  12/22/2023   MM LT BREAST SAVI/RF TAG 1ST LESION MAMMO GUIDE 12/22/2023 [facility]   BREAST LUMPECTOMY WITH RADIO FREQUENCY LOCALIZER Left 12/30/2023   Procedure: BREAST LUMPECTOMY WITH RADIO FREQUENCY  LOCALIZER;  Surgeon: Conrado Delay, DO;  Location: [facility];  Service: General;  Laterality: Left;   COLONOSCOPY WITH PROPOFOL  N/A 03/25/2015   Procedure: COLONOSCOPY WITH PROPOFOL ;  Surgeon: Deveron Fly, MD;  Location: [facility];  Service: Endoscopy;  Laterality: N/A;   TEE WITHOUT CARDIOVERSION N/A 03/26/2023   Procedure: TRANSESOPHAGEAL ECHOCARDIOGRAM (TEE);  Surgeon: Isabell Manzanilla, DO;  Location: [facility];  Service: Cardiovascular;  Laterality: N/A;   Patient Active Problem List   Diagnosis Date Noted   Malignant neoplasm of upper-outer quadrant of left breast in female, estrogen receptor positive (HCC) 01/10/2024   Ductal carcinoma in situ (DCIS) of left breast 12/06/2023   Pneumonia due to COVID-19 virus 06/22/2019   COVID-19 virus infection 06/22/2019   Benign essential hypertension 03/05/2015   Mixed hyperlipidemia 03/05/2015   Moderate mitral insufficiency 03/05/2015   History of hypertension 02/05/2015    PCP: Dr Primus Brookes  REFERRING PROVIDER: Dr Clemente Cushing DIAG: L breast CA with lumpectomy  THERAPY DIAG:  Stiffness of left shoulder, not elsewhere classified  Rationale for Evaluation and Treatment: Rehabilitation  ONSET DATE: 12/24/23  SUBJECTIVE:  SUBJECTIVE STATEMENT: I have been doing the exercises like you showed me since last time.  And my motion is much better.  And I was able to tolerate and get into the radiation position today.  I still have a bandage in my left armpit.   PERTINENT HISTORY:  Patient was diagnosed with left  breast cancer -she had a left lumpectomy by Dr. Rosea Conch on 12/30/2023.  3 lymph nodes removed.  Patient did develop a left axilla abscess that was drained by Dr. Rosea Conch on 01/12/2024.  Family is changing bandages.  And patient was put on antibiotic.   Per patient she finished today.  Radiation starting  PATIENT GOALS:   reduce lymphedema risk and learn post op HEP.   PAIN:  Are you having pain? No  PRECAUTIONS: Patient had abscess drained on 01/12/2024 and left axilla   HAND DOMINANCE: left  WEIGHT BEARING RESTRICTIONS: No  FALLS:  Has patient fallen in last 6 months? No  LIVING ENVIRONMENT: Patient lives with: Alone but staying with family at the moment  OCCUPATION: Patient works part-time as a Comptroller. Patient reports she is a homebody likes to watch TV and she does her own housework     OBJECTIVE:  COGNITION: Overall cognitive status: Within functional limits for tasks assessed    POSTURE:  rounded shoulders posture  UPPER EXTREMITY AROM/PROM: Right upper extremity shoulder range of motion within normal limits. Left shoulder range of motion 110 degrees flexion and abduction 95 . External rotation limited to 75 degrees. Patient continues to have a bandage in left axilla limiting endrange motion Range of motion improved after review of home exercises at end of session  01/26/2024 patient present today with increased active range of motion to flexion 145 degrees and abduction 130 degrees symptom-free.  External rotation 85 degrees.  Patient still have small bandage in axilla that was replaced by radiation nurse.   UPPER EXTREMITY STRENGTH: Right within functional limits.  Left not tested.      LYMPHEDEMA/ONCOLOGY QUESTIONNAIRE - 01/26/24 0001       Left Upper Extremity Lymphedema   15 cm Proximal to Olecranon Process 34.5 cm    10 cm Proximal to Olecranon Process 35.4 cm    Olecranon Process 26.5 cm          L-DEX LYMPHEDEMA SCREENING:  The patient was assessed using the L-Dex machine today to produce a lymphedema index baseline score. The patient will be reassessed on a regular basis (typically every 3 months) to obtain new L-Dex scores. If the score is > 6.5 points away from his/her baseline score  indicating onset of subclinical lymphedema, it will be recommended to wear a compression garment for 4 weeks, 12 hours per day and then be reassessed. If the score continues to be > 6.5 points from baseline at reassessment, we will initiate lymphedema treatment. Assessing in this manner has a 95% rate of preventing clinically significant lymphedema.   L-DEX FLOWSHEETS - 01/26/24 1600       L-DEX LYMPHEDEMA SCREENING   Measurement Type Unilateral    L-DEX MEASUREMENT EXTREMITY Upper Extremity    POSITION  Seated    DOMINANT SIDE Left    At Risk Side Left    BASELINE SCORE (UNILATERAL) 7.5    L-DEX SCORE (UNILATERAL) 7.8    VALUE CHANGE (UNILAT) 0.3          Today session: Patient arrive after had radiation simulation today.  Patient with great improvement of active range of motion in left  shoulder compared to last session.  Patient report no pain or discomfort.  Reviewed with patient active assisted range of motion in supine for shoulder flexion and abduction as well as external rotation.  Patient to continue throughout radiation with home exercises in the morning as well as prior to coming in for appointments to maintain flexibility.  Patient still have bandage in the axilla. Patient circumference within normal limits see flowsheet As well as L-Dex score within normal limits.  Patient can follow-up with breast navigator after radiation.  PATIENT EDUCATION:  Education details: Lymphedema risk reduction info provided and post op shoulder/posture HEP Person educated: Patient Education method: Explanation, Demonstration, Handout Education comprehension: Patient verbalized understanding and returned demonstration  HOME EXERCISE PROGRAM: Patient and niece was instructed today in a home exercise program today for post op shoulder range of motion. These included active assist shoulder flexion and abduction and supine as well as external rotation maxing with gravity.  3 times a day 10-15 reps  pain-free.  One of the session should be before she comes in for radiation.  Patient to continue with range of motion throughout radiation. , scapular retraction can do that several times during the day.  She was encouraged to do these 2-3 x day, holding 3 seconds and repeating 10 times when permitted by her physician/surgeon.   ASSESSMENT:  CLINICAL IMPRESSION: Patient present at OT evaluation postop left lumpectomy.  Patient is starting radiation simulation today patient lives alone but staying with family at the moment.  Per patient and family 3 lymph nodes removed.  Lumpectomy was done by Dr. Rosea Conch on 12/30/2023.  Patient did develop a abscess that had to be drained on 01/11/2021.  Patient continued to have a bandage in left axilla that was replaced by radiation nurse.  Patient arrived today with great improvement in left shoulder flexion and abduction external rotation symptom-free.  Patient demo home exercise program.  Patient to continue throughout radiation with active assisted range of motion to maintain her flexibility and prevent shoulder issues in the future.  Patient circumference in left upper extremity as well as L-Dex score within normal limits.  Patient can follow-up after radiation with breast navigator to repeat L-Dex score. She will benefit from OT reassessment progressing range of motion and from L-Dex screens every 3 months for 2 years to detect subclinical lymphedema.  Pt will benefit from skilled therapeutic intervention to improve on the following deficits: Decreased knowledge of precautions and lymphedema education, impaired UE functional use, pain, decreased ROM, postural dysfunction.   OT treatment/interventions: ADL/self-care home management, pt/family education, therapeutic exercise,manual therapy  REHAB POTENTIAL: Good  CLINICAL DECISION MAKING: Stable/uncomplicated  EVALUATION COMPLEXITY: Low   GOALS: Goals reviewed with patient? YES  LONG TERM GOALS: (STG=LTG)     Name Target Date Goal status  1 Pt will be able to verbalize understanding of pertinent lymphedema risk reduction practices relevant to her dx specifically related to skin care.  Baseline:  No knowledge 04/12/24 Initial  2 Pt will be able to return demo and/or verbalize understanding of the post op HEP related to regaining shoulder ROM. Baseline:  No knowledge Today Achieved at eval       4 Pt will demo she has regained full shoulder ROM and function post operatively compared to baselines.  Baseline: See objective measurements taken today. 12 weeks Initial    PLAN:      OT FREQUENCY/DURATION: EVAL and 5 follow up appointments  Occupational Therapy Information for After Breast Cancer Surgery/Treatment:  Lymphedema is a swelling condition that you may be at risk for in your arm if you have lymph nodes removed from the armpit area.  After a sentinel node biopsy, the risk is approximately 5-9% and is higher after an axillary node dissection.  There is treatment available for this condition and it is not life-threatening.  Contact your physician or occupational therapist with concerns. You may begin the 4 shoulder/posture exercises (see additional sheet) when permitted by your physician (typically a week after surgery).  If you have drains, you may need to wait until those are removed before beginning range of motion exercises.  A general recommendation is to not lift your arms above shoulder height until drains are removed.  These exercises should be done to your tolerance and gently.  This is not a no pain/no gain type of recovery so listen to your body and stretch into the range of motion that you can tolerate, stopping if you have pain.  If you are having immediate reconstruction, ask your plastic surgeon about doing exercises as he or she may want you to wait. .  While undergoing any medical procedure or treatment, try to avoid blood pressure being taken or needle sticks from occurring on  the arm on the side of cancer.   This recommendation begins after surgery and continues for the rest of your life.  This may help reduce your risk of getting lymphedema (swelling in your arm). An excellent resource for those seeking information on lymphedema is the National Lymphedema Network's web site. It can be accessed at www.lymphnet.org If you notice swelling in your hand, arm or breast at any time following surgery (even if it is many years from now), please contact your doctor or occupational therapist to discuss this.  Lymphedema can be treated at any time but it is easier for you if it is treated early on.  If you feel like your shoulder motion is not returning to normal in a reasonable amount of time, please contact your surgeon or occupational therapist.  [facility] [phone]. [address], [place], [place] [address]  Patient was instructed today in a home exercise program today for post op shoulder range of motion. These included active assist shoulder flexion in standing/supine, scapular retraction, wall walking/slides with shoulder abduction, and hands behind head external rotation in supine.  She was encouraged to do these 2-3 x day, holding 3 seconds and repeating 10 times when permitted by her physician/surgeon      Heloise Lobo, OTR/L,CLT 01/26/2024, 4:45 PM

## 2024-01-31 ENCOUNTER — Other Ambulatory Visit: Payer: Self-pay

## 2024-01-31 ENCOUNTER — Ambulatory Visit
Admission: RE | Admit: 2024-01-31 | Discharge: 2024-01-31 | Disposition: A | Discharge status: Home / Self Care | Source: Ambulatory Visit | Attending: Radiation Oncology | Admitting: Radiation Oncology

## 2024-01-31 ENCOUNTER — Other Ambulatory Visit

## 2024-01-31 DIAGNOSIS — Z510 Encounter for antineoplastic radiation therapy: Secondary | ICD-10-CM | POA: Diagnosis not present

## 2024-01-31 LAB — RAD ONC ARIA SESSION SUMMARY
Course Elapsed Days: 0
Plan Fractions Treated to Date: 1
Plan Prescribed Dose Per Fraction: 3.5 Gy
Plan Total Fractions Prescribed: 10
Plan Total Prescribed Dose: 35 Gy
Reference Point Dosage Given to Date: 3.5 Gy
Reference Point Session Dosage Given: 3.5 Gy
Session Number: 1

## 2024-01-31 NOTE — Progress Notes (Signed)
 Tumor Board Documentation  Erika Schmidt was presented by Dr Melanee at our Tumor Board on 01/31/2024, which included representatives from medical oncology, radiation oncology, surgical, radiology, pathology, navigation.  Erika Schmidt currently presents as a current patient with history of the following treatments: surgical intervention(s).  Additionally, we reviewed previous medical and familial history, history of present illness, and recent lab results along with all available histopathologic and imaging studies. The tumor board considered available treatment options and made the following recommendations: Adjuvant radiation, Hormonal therapy    The following procedures/referrals were also placed: No orders of the defined types were placed in this encounter.   Clinical Trial Status: not discussed   Staging used: AJCC Staging: T: T1a N: N0       National site-specific guidelines   were discussed with respect to the case.  Tumor board is a meeting of clinicians from various specialty areas who evaluate and discuss patients for whom a multidisciplinary approach is being considered. Final determinations in the plan of care are those of the provider(s). The responsibility for follow up of recommendations given during tumor board is that of the provider.   Today's extended care, comprehensive team conference, Erika Schmidt was not present for the discussion and was not examined.   Multidisciplinary Tumor Board is a multidisciplinary case peer review process.  Decisions discussed in the Multidisciplinary Tumor Board reflect the opinions of the specialists present at the conference without having examined the patient.  Ultimately, treatment and diagnostic decisions rest with the primary provider(s) and the patient.

## 2024-02-01 ENCOUNTER — Inpatient Hospital Stay

## 2024-02-01 ENCOUNTER — Ambulatory Visit
Admission: RE | Admit: 2024-02-01 | Discharge: 2024-02-01 | Disposition: A | Discharge status: Home / Self Care | Source: Ambulatory Visit | Attending: Radiation Oncology | Admitting: Radiation Oncology

## 2024-02-01 ENCOUNTER — Other Ambulatory Visit: Payer: Self-pay

## 2024-02-01 DIAGNOSIS — D0512 Intraductal carcinoma in situ of left breast: Secondary | ICD-10-CM

## 2024-02-01 DIAGNOSIS — Z510 Encounter for antineoplastic radiation therapy: Secondary | ICD-10-CM | POA: Diagnosis not present

## 2024-02-01 DIAGNOSIS — C50412 Malignant neoplasm of upper-outer quadrant of left female breast: Secondary | ICD-10-CM | POA: Diagnosis not present

## 2024-02-01 LAB — CBC (CANCER CENTER ONLY)
HCT: 49.8 % — ABNORMAL HIGH (ref 36.0–46.0)
Hemoglobin: 16.1 g/dL — ABNORMAL HIGH (ref 12.0–15.0)
MCH: 26.3 pg (ref 26.0–34.0)
MCHC: 32.3 g/dL (ref 30.0–36.0)
MCV: 81.2 fL (ref 80.0–100.0)
Platelet Count: 285 10*3/uL (ref 150–400)
RBC: 6.13 MIL/uL — ABNORMAL HIGH (ref 3.87–5.11)
RDW: 16.3 % — ABNORMAL HIGH (ref 11.5–15.5)
WBC Count: 10.8 10*3/uL — ABNORMAL HIGH (ref 4.0–10.5)
nRBC: 0 % (ref 0.0–0.2)

## 2024-02-01 LAB — RAD ONC ARIA SESSION SUMMARY
Course Elapsed Days: 1
Plan Fractions Treated to Date: 2
Plan Prescribed Dose Per Fraction: 3.5 Gy
Plan Total Fractions Prescribed: 10
Plan Total Prescribed Dose: 35 Gy
Reference Point Dosage Given to Date: 7 Gy
Reference Point Session Dosage Given: 3.5 Gy
Session Number: 2

## 2024-02-02 ENCOUNTER — Other Ambulatory Visit: Payer: Self-pay

## 2024-02-02 ENCOUNTER — Ambulatory Visit
Admission: RE | Admit: 2024-02-02 | Discharge: 2024-02-02 | Disposition: A | Discharge status: Home / Self Care | Source: Ambulatory Visit | Attending: Radiation Oncology | Admitting: Radiation Oncology

## 2024-02-02 DIAGNOSIS — Z510 Encounter for antineoplastic radiation therapy: Secondary | ICD-10-CM | POA: Diagnosis not present

## 2024-02-02 LAB — RAD ONC ARIA SESSION SUMMARY
Course Elapsed Days: 2
Plan Fractions Treated to Date: 3
Plan Prescribed Dose Per Fraction: 3.5 Gy
Plan Total Fractions Prescribed: 10
Plan Total Prescribed Dose: 35 Gy
Reference Point Dosage Given to Date: 10.5 Gy
Reference Point Session Dosage Given: 3.5 Gy
Session Number: 3

## 2024-02-03 ENCOUNTER — Other Ambulatory Visit: Payer: Self-pay

## 2024-02-03 ENCOUNTER — Ambulatory Visit
Admission: RE | Admit: 2024-02-03 | Discharge: 2024-02-03 | Disposition: A | Discharge status: Home / Self Care | Source: Ambulatory Visit | Attending: Radiation Oncology | Admitting: Radiation Oncology

## 2024-02-03 DIAGNOSIS — Z510 Encounter for antineoplastic radiation therapy: Secondary | ICD-10-CM | POA: Diagnosis not present

## 2024-02-03 LAB — RAD ONC ARIA SESSION SUMMARY
Course Elapsed Days: 3
Plan Fractions Treated to Date: 4
Plan Prescribed Dose Per Fraction: 3.5 Gy
Plan Total Fractions Prescribed: 10
Plan Total Prescribed Dose: 35 Gy
Reference Point Dosage Given to Date: 14 Gy
Reference Point Session Dosage Given: 3.5 Gy
Session Number: 4

## 2024-02-04 ENCOUNTER — Ambulatory Visit
Admission: RE | Admit: 2024-02-04 | Discharge: 2024-02-04 | Disposition: A | Discharge status: Home / Self Care | Source: Ambulatory Visit | Attending: Radiation Oncology | Admitting: Radiation Oncology

## 2024-02-04 ENCOUNTER — Other Ambulatory Visit: Payer: Self-pay

## 2024-02-04 DIAGNOSIS — Z510 Encounter for antineoplastic radiation therapy: Secondary | ICD-10-CM | POA: Diagnosis not present

## 2024-02-04 LAB — RAD ONC ARIA SESSION SUMMARY
Course Elapsed Days: 4
Plan Fractions Treated to Date: 5
Plan Prescribed Dose Per Fraction: 3.5 Gy
Plan Total Fractions Prescribed: 10
Plan Total Prescribed Dose: 35 Gy
Reference Point Dosage Given to Date: 17.5 Gy
Reference Point Session Dosage Given: 3.5 Gy
Session Number: 5

## 2024-02-07 ENCOUNTER — Ambulatory Visit
Admission: RE | Admit: 2024-02-07 | Discharge: 2024-02-07 | Disposition: A | Discharge status: Home / Self Care | Source: Ambulatory Visit | Attending: Radiation Oncology | Admitting: Radiation Oncology

## 2024-02-07 ENCOUNTER — Other Ambulatory Visit: Payer: Self-pay

## 2024-02-07 DIAGNOSIS — Z510 Encounter for antineoplastic radiation therapy: Secondary | ICD-10-CM | POA: Diagnosis not present

## 2024-02-07 LAB — RAD ONC ARIA SESSION SUMMARY
Course Elapsed Days: 7
Plan Fractions Treated to Date: 6
Plan Prescribed Dose Per Fraction: 3.5 Gy
Plan Total Fractions Prescribed: 10
Plan Total Prescribed Dose: 35 Gy
Reference Point Dosage Given to Date: 21 Gy
Reference Point Session Dosage Given: 3.5 Gy
Session Number: 6

## 2024-02-08 ENCOUNTER — Ambulatory Visit
Admission: RE | Admit: 2024-02-08 | Discharge: 2024-02-08 | Disposition: A | Discharge status: Home / Self Care | Source: Ambulatory Visit | Attending: Radiation Oncology | Admitting: Radiation Oncology

## 2024-02-08 ENCOUNTER — Other Ambulatory Visit: Payer: Self-pay

## 2024-02-08 DIAGNOSIS — D0512 Intraductal carcinoma in situ of left breast: Secondary | ICD-10-CM | POA: Diagnosis present

## 2024-02-08 DIAGNOSIS — Z510 Encounter for antineoplastic radiation therapy: Secondary | ICD-10-CM | POA: Insufficient documentation

## 2024-02-08 LAB — RAD ONC ARIA SESSION SUMMARY
Course Elapsed Days: 8
Plan Fractions Treated to Date: 7
Plan Prescribed Dose Per Fraction: 3.5 Gy
Plan Total Fractions Prescribed: 10
Plan Total Prescribed Dose: 35 Gy
Reference Point Dosage Given to Date: 24.5 Gy
Reference Point Session Dosage Given: 3.5 Gy
Session Number: 7

## 2024-02-09 ENCOUNTER — Other Ambulatory Visit: Payer: Self-pay

## 2024-02-09 ENCOUNTER — Ambulatory Visit
Admission: RE | Admit: 2024-02-09 | Discharge: 2024-02-09 | Disposition: A | Discharge status: Home / Self Care | Source: Ambulatory Visit | Attending: Radiation Oncology | Admitting: Radiation Oncology

## 2024-02-09 DIAGNOSIS — Z510 Encounter for antineoplastic radiation therapy: Secondary | ICD-10-CM | POA: Diagnosis not present

## 2024-02-09 LAB — RAD ONC ARIA SESSION SUMMARY
Course Elapsed Days: 9
Plan Fractions Treated to Date: 8
Plan Prescribed Dose Per Fraction: 3.5 Gy
Plan Total Fractions Prescribed: 10
Plan Total Prescribed Dose: 35 Gy
Reference Point Dosage Given to Date: 28 Gy
Reference Point Session Dosage Given: 3.5 Gy
Session Number: 8

## 2024-02-10 ENCOUNTER — Other Ambulatory Visit: Payer: Self-pay

## 2024-02-10 ENCOUNTER — Ambulatory Visit
Admission: RE | Admit: 2024-02-10 | Discharge: 2024-02-10 | Disposition: A | Discharge status: Home / Self Care | Source: Ambulatory Visit | Attending: Radiation Oncology | Admitting: Radiation Oncology

## 2024-02-10 DIAGNOSIS — Z510 Encounter for antineoplastic radiation therapy: Secondary | ICD-10-CM | POA: Diagnosis not present

## 2024-02-10 LAB — RAD ONC ARIA SESSION SUMMARY
Course Elapsed Days: 10
Plan Fractions Treated to Date: 9
Plan Prescribed Dose Per Fraction: 3.5 Gy
Plan Total Fractions Prescribed: 10
Plan Total Prescribed Dose: 35 Gy
Reference Point Dosage Given to Date: 31.5 Gy
Reference Point Session Dosage Given: 3.5 Gy
Session Number: 9

## 2024-02-14 ENCOUNTER — Ambulatory Visit
Admission: RE | Admit: 2024-02-14 | Discharge: 2024-02-14 | Disposition: A | Discharge status: Home / Self Care | Source: Ambulatory Visit | Attending: Radiation Oncology | Admitting: Radiation Oncology

## 2024-02-14 ENCOUNTER — Other Ambulatory Visit: Payer: Self-pay

## 2024-02-14 ENCOUNTER — Encounter: Payer: Self-pay | Admitting: *Deleted

## 2024-02-14 DIAGNOSIS — Z510 Encounter for antineoplastic radiation therapy: Secondary | ICD-10-CM | POA: Diagnosis not present

## 2024-02-14 LAB — RAD ONC ARIA SESSION SUMMARY
Course Elapsed Days: 14
Plan Fractions Treated to Date: 10
Plan Prescribed Dose Per Fraction: 3.5 Gy
Plan Total Fractions Prescribed: 10
Plan Total Prescribed Dose: 35 Gy
Reference Point Dosage Given to Date: 35 Gy
Reference Point Session Dosage Given: 3.5 Gy
Session Number: 10

## 2024-02-14 MED ORDER — LETROZOLE 2.5 MG PO TABS: 2.5000 mg | ORAL_TABLET | Freq: Every day | ORAL | 6 refills | Status: DC

## 2024-02-16 NOTE — Radiation Completion Notes (Signed)
 Patient Name: Erika Schmidt, Erika Schmidt MRN: [id number] Date of Birth: [birth year] Referring Physician: ANNAH SKENE, M.D. Date of Service: 2024-02-16 Radiation Oncologist: Marcey Penton, M.D. [facility]                             RADIATION ONCOLOGY END OF TREATMENT NOTE     Diagnosis: D05.12 Intraductal carcinoma in situ of left breast Staging on 2023-12-06: Ductal carcinoma in situ (DCIS) of left breast T=cTis (DCIS), N=cN0, M=cM0 Staging on 2024-01-10: Malignant neoplasm of upper-outer quadrant of left breast in female, estrogen receptor positive (HCC) T=pT1a, N=pN0, M=cM0 Intent: Curative     HPI: Patient is a 68 year old female who presented with an abnormal mammogram of the left breast with 2 concerning areas adjacent to each other.  Left breast also showed pleomorphic calcifications in a segmental distribution in the upper outer quadrant.  No left axillary adenopathy was identified.  Patient underwent left stereotactic guided biopsy of 2 sites with both showing ductal carcinoma in situ intermediate grade with associated calcifications and sclerosis.  One of the lesions was involved with an intraductal papilloma.  Patient went on to have a wide local excision and sentinel node biopsy showing 0.3 cm of grade 2 invasive ductal carcinoma as well as ductal carcinoma in situ.  1 sentinel lymph node was negative for metastatic disease.  Margins were clear at 1 cm for all margins.  She has done well postoperatively is still having some slight drainage from her sentinel node biopsy in the left axilla.  No and no indication of mastitis.      ==========DELIVERED PLANS==========  First Treatment Date: 2024-01-31 Last Treatment Date: 2024-02-14   Plan Name: Breast_L_APBI Site: Breast, Left Technique: 3D Mode: Photon Dose Per Fraction: 3.5 Gy Prescribed Dose (Delivered / Prescribed): 35 Gy / 35 Gy Prescribed Fxs (Delivered / Prescribed): 10 / 10     ==========ON TREATMENT  VISIT DATES========== 2024-02-01, 2024-02-08     ==========UPCOMING VISITS==========       ==========APPENDIX - ON TREATMENT VISIT NOTES==========   See weekly On Treatment Notes in Epic for details in the Media tab (listed as Progress notes on the On Treatment Visit Dates listed above).

## 2024-03-20 ENCOUNTER — Ambulatory Visit
Admission: RE | Admit: 2024-03-20 | Discharge: 2024-03-20 | Disposition: A | Discharge status: Home / Self Care | Source: Ambulatory Visit | Attending: Radiation Oncology | Admitting: Radiation Oncology

## 2024-03-20 ENCOUNTER — Encounter: Payer: Self-pay | Admitting: Radiation Oncology

## 2024-03-20 VITALS — BP 105/67 | HR 104 | Temp 98.0°F | Resp 24

## 2024-03-20 DIAGNOSIS — C50912 Malignant neoplasm of unspecified site of left female breast: Secondary | ICD-10-CM | POA: Insufficient documentation

## 2024-03-20 DIAGNOSIS — Z170 Estrogen receptor positive status [ER+]: Secondary | ICD-10-CM | POA: Insufficient documentation

## 2024-03-20 NOTE — Progress Notes (Signed)
 Radiation Oncology Follow up Note  Name: Erika Schmidt   Date:   03/20/2024 MRN:  [id number] DOB: Jan 12, 1956    This 68 y.o. female presents to the clinic today for 1 month follow-up status post whole breast radiation to her left breast for stage Ia (T1 a N0 M0) invasive mammary carcinoma status post wide local excision and sentinel node biopsy ER positive.  REFERRING PROVIDER: Sherial Bail, MD  HPI: Patient is a 68 year old female now at 1 month having completed whole breast radiation to her left breast for a stage Ia ER positive invasive mammary carcinoma.  Seen today in routine follow-up from a breast standpoint she is doing well.  She specifically denies breast tenderness.  She is having marked shortness of breath dyspnea on exertion today.  She is wheelchair-bound..  Patient has been started on letrozole  apparently is tolerating it well without side effect  COMPLICATIONS OF TREATMENT: none  FOLLOW UP COMPLIANCE: keeps appointments   PHYSICAL EXAM:  BP 105/67   Pulse (!) 104   Temp 98 F (36.7 C) (Tympanic)   Resp (!) 24   SpO2 92%  Lungs are clear to A&P cardiac examination essentially unremarkable with regular rate and rhythm. No dominant mass or nodularity is noted in either breast in 2 positions examined. Incision is well-healed. No axillary or supraclavicular adenopathy is appreciated. Cosmetic result is excellent.  Well-developed well-nourished patient in NAD. HEENT reveals PERLA, EOMI, discs not visualized.  Oral cavity is clear. No oral mucosal lesions are identified. Neck is clear without evidence of cervical or supraclavicular adenopathy. Lungs are clear to A&P. Cardiac examination is essentially unremarkable with regular rate and rhythm without murmur rub or thrill. Abdomen is benign with no organomegaly or masses noted. Motor sensory and DTR levels are equal and symmetric in the upper and lower extremities. Cranial nerves II through XII are grossly intact.  Proprioception is intact. No peripheral adenopathy or edema is identified. No motor or sensory levels are noted. Crude visual fields are within normal range.  RADIOLOGY RESULTS: No current films to review  PLAN: Present time patient is wheelchair-bound having difficulty with respiration.  I am going to turn follow-up care over to medical oncology just to cut down on the patient's burden of doctor visits.  I would be happy to reevaluate patient anytime should that be indicated.  She is doing well with very low side effect profile status post whole breast radiation.  Patient knows to call with any concerns at any time.  I would like to take this opportunity to thank you for allowing me to participate in the care of your patient.[name] Marcey Penton, MD

## 2024-04-12 ENCOUNTER — Ambulatory Visit
Admission: RE | Admit: 2024-04-12 | Discharge: 2024-04-12 | Disposition: A | Discharge status: Home / Self Care | Source: Ambulatory Visit | Attending: Oncology | Admitting: Oncology

## 2024-04-12 DIAGNOSIS — C50412 Malignant neoplasm of upper-outer quadrant of left female breast: Secondary | ICD-10-CM | POA: Insufficient documentation

## 2024-04-12 DIAGNOSIS — Z170 Estrogen receptor positive status [ER+]: Secondary | ICD-10-CM | POA: Diagnosis present

## 2024-04-12 DIAGNOSIS — M8589 Other specified disorders of bone density and structure, multiple sites: Secondary | ICD-10-CM | POA: Diagnosis not present

## 2024-04-12 DIAGNOSIS — Z13820 Encounter for screening for osteoporosis: Secondary | ICD-10-CM | POA: Diagnosis not present

## 2024-04-24 ENCOUNTER — Other Ambulatory Visit: Payer: Self-pay

## 2024-04-24 DIAGNOSIS — C50412 Malignant neoplasm of upper-outer quadrant of left female breast: Secondary | ICD-10-CM

## 2024-04-25 ENCOUNTER — Encounter: Payer: Self-pay | Admitting: Oncology

## 2024-04-25 ENCOUNTER — Ambulatory Visit: Admitting: Oncology

## 2024-04-25 ENCOUNTER — Other Ambulatory Visit

## 2024-04-25 ENCOUNTER — Inpatient Hospital Stay (HOSPITAL_BASED_OUTPATIENT_CLINIC_OR_DEPARTMENT_OTHER): Admitting: Oncology

## 2024-04-25 ENCOUNTER — Inpatient Hospital Stay: Attending: Oncology

## 2024-04-25 VITALS — BP 118/74 | HR 73 | Temp 98.6°F | Resp 16 | Wt 180.3 lb

## 2024-04-25 DIAGNOSIS — Z79811 Long term (current) use of aromatase inhibitors: Secondary | ICD-10-CM | POA: Insufficient documentation

## 2024-04-25 DIAGNOSIS — Z08 Encounter for follow-up examination after completed treatment for malignant neoplasm: Secondary | ICD-10-CM

## 2024-04-25 DIAGNOSIS — D0512 Intraductal carcinoma in situ of left breast: Secondary | ICD-10-CM

## 2024-04-25 DIAGNOSIS — C50412 Malignant neoplasm of upper-outer quadrant of left female breast: Secondary | ICD-10-CM

## 2024-04-25 DIAGNOSIS — Z170 Estrogen receptor positive status [ER+]: Secondary | ICD-10-CM | POA: Insufficient documentation

## 2024-04-25 DIAGNOSIS — Z79899 Other long term (current) drug therapy: Secondary | ICD-10-CM

## 2024-04-25 LAB — CMP (CANCER CENTER ONLY)
ALT: 20 U/L (ref 0–44)
AST: 27 U/L (ref 15–41)
Albumin: 3.1 g/dL — ABNORMAL LOW (ref 3.5–5.0)
Alkaline Phosphatase: 79 U/L (ref 38–126)
Anion gap: 9 (ref 5–15)
BUN: 11 mg/dL (ref 8–23)
CO2: 24 mmol/L (ref 22–32)
Calcium: 8.8 mg/dL — ABNORMAL LOW (ref 8.9–10.3)
Chloride: 103 mmol/L (ref 98–111)
Creatinine: 0.87 mg/dL (ref 0.44–1.00)
GFR, Estimated: >60 mL/min (ref >60)
Glucose, Bld: 83 mg/dL (ref 70–99)
Potassium: 3.5 mmol/L (ref 3.5–5.1)
Sodium: 136 mmol/L (ref 135–145)
Total Bilirubin: 1.4 mg/dL — ABNORMAL HIGH (ref 0.0–1.2)
Total Protein: 7.2 g/dL (ref 6.5–8.1)

## 2024-04-25 LAB — CBC WITH DIFFERENTIAL (CANCER CENTER ONLY)
Abs Immature Granulocytes: 0.02 K/uL (ref 0.00–0.07)
Basophils Absolute: 0 K/uL (ref 0.0–0.1)
Basophils Relative: 0 %
Eosinophils Absolute: 0.1 K/uL (ref 0.0–0.5)
Eosinophils Relative: 1 %
HCT: 46.1 % — ABNORMAL HIGH (ref 36.0–46.0)
Hemoglobin: 14.7 g/dL (ref 12.0–15.0)
Immature Granulocytes: 0 %
Lymphocytes Relative: 27 %
Lymphs Abs: 2.2 K/uL (ref 0.7–4.0)
MCH: 26.5 pg (ref 26.0–34.0)
MCHC: 31.9 g/dL (ref 30.0–36.0)
MCV: 83.1 fL (ref 80.0–100.0)
Monocytes Absolute: 0.6 K/uL (ref 0.1–1.0)
Monocytes Relative: 7 %
Neutro Abs: 5.3 K/uL (ref 1.7–7.7)
Neutrophils Relative %: 65 %
Platelet Count: 231 K/uL (ref 150–400)
RBC: 5.55 MIL/uL — ABNORMAL HIGH (ref 3.87–5.11)
RDW: 14.8 % (ref 11.5–15.5)
WBC Count: 8.2 K/uL (ref 4.0–10.5)
nRBC: 0 % (ref 0.0–0.2)

## 2024-04-25 NOTE — Progress Notes (Signed)
 Hematology/Oncology Consult note [facility]  Telephone:([phone] Fax:([phone]  Patient Care Team: Sherial Bail, MD as PCP - General (Internal Medicine) Melanee Annah BROCKS, MD as Consulting Physician (Oncology) Lenn Aran, MD as Consulting Physician (Radiation Oncology)   Name of the patient: Erika Schmidt  [id number]  April 10, 1956   Date of visit: 04/25/24  Diagnosis- left breast DCIS ER positive   Chief complaint/ Reason for visit-routine follow-up of DCIS on letrozole   Heme/Onc history:  Patient is a 68 year old female diagnosed with left breast DCIS ER positive based on the mammogram in April 2025.diagnostic mammogram and an ultrasound which showed calcifications spanning 9 cm in the AP dimension and 5.3 cm in the transverse dimensions. There were 2 hypoechoic masses noted at the 1 o'clock position of the left breast 13 cm and 15 cm from the nipple measuring 1.2 x 1 x 0.8 and 0.9 x 0.8 x 0.8 cm respectively. Left axillary ultrasound showed benign lymph nodes. 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  1 lymph node negative for metastatic carcinoma.  There was also associated intermediate grade DCIS invasive mammary carcinoma was 100% ER positive, PR negative, Ki-67 2%.her 2 negative  Interval history-patient is tolerating letrozole  well along with vitamin D.  She is not sure if she is taking calcium  or not.  Denies any hot flashes mood swings arthralgias  ECOG PS- 1 Pain scale- 0   Review of systems- Review of Systems  Constitutional:  Negative for chills, fever, malaise/fatigue and weight loss.  HENT:  Negative for congestion, ear discharge and nosebleeds.   Eyes:   Negative for blurred vision.  Respiratory:  Negative for cough, hemoptysis, sputum production, shortness of breath and wheezing.   Cardiovascular:  Negative for chest pain, palpitations, orthopnea and claudication.  Gastrointestinal:  Negative for abdominal pain, blood in stool, constipation, diarrhea, heartburn, melena, nausea and vomiting.  Genitourinary:  Negative for dysuria, flank pain, frequency, hematuria and urgency.  Musculoskeletal:  Negative for back pain, joint pain and myalgias.  Skin:  Negative for rash.  Neurological:  Negative for dizziness, tingling, focal weakness, seizures, weakness and headaches.  Endo/Heme/Allergies:  Does not bruise/bleed easily.  Psychiatric/Behavioral:  Negative for depression and suicidal ideas. The patient does not have insomnia.       Allergies  Allergen Reactions   Augmentin [Amoxicillin-Pot Clavulanate] Diarrhea   Floxin [Ofloxacin]      Past Medical History:  Diagnosis Date   Breast cancer (HCC)    Bronchiectasis without complication (HCC)    CHF (congestive heart failure) (HCC)    Chronic hypoxemic respiratory failure (HCC)    Dyspnea    Hypertension    ILD (interstitial lung disease) (HCC)    Pre-diabetes    Pulmonary fibrosis (HCC)      Past Surgical History:  Procedure Laterality Date   ABDOMINAL HYSTERECTOMY     AXILLARY SENTINEL NODE BIOPSY Left 12/30/2023   Procedure: BIOPSY, LYMPH NODE, SENTINEL, AXILLARY;  Surgeon: Tye Millet, DO;  Location: [facility];  Service: General;  Laterality: Left;   BREAST BIOPSY Left 12/01/2023   MM LT BREAST BX W LOC DEV 1ST LESION IMAGE BX SPEC STEREO GUIDE 12/01/2023 [facility]   BREAST BIOPSY Left 12/01/2023   MM LT BREAST BX W LOC DEV  EA AD LESION IMG BX SPEC STEREO GUIDE 12/01/2023 [facility]   BREAST BIOPSY Left 12/01/2023   US  LT BREAST BX W LOC DEV 1ST LESION IMG BX SPEC US  GUIDE 12/01/2023 [facility]   BREAST BIOPSY Left 12/01/2023   US  LT BREAST BX W LOC DEV EA ADD  LESION IMG BX SPEC US  GUIDE 12/01/2023 [facility]   BREAST BIOPSY  12/22/2023   MM LT BREAST SAVI/RF TAG EA ADD'L LESION MAMMO GUIDE 12/22/2023 [facility]   BREAST BIOPSY  12/22/2023   MM LT BREAST SAVI/RF TAG 1ST LESION MAMMO GUIDE 12/22/2023 [facility]   BREAST LUMPECTOMY WITH RADIO FREQUENCY LOCALIZER Left 12/30/2023   Procedure: BREAST LUMPECTOMY WITH RADIO FREQUENCY LOCALIZER;  Surgeon: Tye Millet, DO;  Location: [facility];  Service: General;  Laterality: Left;   COLONOSCOPY WITH PROPOFOL  N/A 03/25/2015   Procedure: COLONOSCOPY WITH PROPOFOL ;  Surgeon: Gladis RAYMOND Mariner, MD;  Location: [facility];  Service: Endoscopy;  Laterality: N/A;   TEE WITHOUT CARDIOVERSION N/A 03/26/2023   Procedure: TRANSESOPHAGEAL ECHOCARDIOGRAM (TEE);  Surgeon: Dewane Shiner, DO;  Location: [facility];  Service: Cardiovascular;  Laterality: N/A;    Social History   Socioeconomic History   Marital status: Married    Spouse name: Chief Financial Officer   Number of children: 2   Years of education: Not on file   Highest education level: Not on file  Occupational History   Not on file  Tobacco Use   Smoking status: Never   Smokeless tobacco: Never  Vaping Use   Vaping status: Never Used  Substance and Sexual Activity   Alcohol use: Not Currently    Comment: Occasionally   Drug use: Never   Sexual activity: Not Currently  Other Topics Concern   Not on file  Social History Narrative   Patient has 7 grandchildren & 3 great-grandchildren!    Social Drivers of Health   Financial Resource Strain: Medium Risk (12/06/2023)   Received from [facility]   Overall Financial Resource Strain (CARDIA)    Difficulty of Paying Living Expenses: Somewhat hard  Food Insecurity: Food Insecurity Present (12/06/2023)   Received from [facility]   Hunger Vital Sign    Within the past 12 months, you worried that your food would run out before you got the money to buy more.: Sometimes  true    Within the past 12 months, the food you bought just didn't last and you didn't have money to get more.: Sometimes true  Transportation Needs: No Transportation Needs (12/06/2023)   PRAPARE - Administrator, Civil Service (Medical): No    Lack of Transportation (Non-Medical): No  Physical Activity: Not on file  Stress: Not on file  Social Connections: Not on file  Intimate Partner Violence: Not At Risk (12/06/2023)   Humiliation, Afraid, Rape, and Kick questionnaire    Fear of Current or Ex-Partner: No    Emotionally Abused: No    Physically Abused: No    Sexually Abused: No    Family History  Problem Relation Age of Onset   Stroke Father    Congestive Heart Failure Sister    Graves' disease Sister    Prostate cancer Brother    Heart attack Brother    Heart attack Brother    Aneurysm Paternal Grandmother    Breast cancer Neg Hx      Current Outpatient Medications:    acetaminophen  (TYLENOL ) 500 MG tablet, Take 500 mg by mouth every 8 (eight) hours as needed., Disp: , Rfl:  albuterol  (VENTOLIN  HFA) 108 (90 Base) MCG/ACT inhaler, Inhale 2 puffs into the lungs every 6 (six) hours as needed for wheezing or shortness of breath., Disp: 6.7 g, Rfl: 0   aspirin  81 MG tablet, Take 1 tablet (81 mg total) by mouth daily., Disp: 30 tablet, Rfl:    atorvastatin  (LIPITOR) 40 MG tablet, Take 40 mg by mouth daily., Disp: , Rfl:    cefdinir (OMNICEF) 300 MG capsule, Take 300 mg by mouth 2 (two) times daily., Disp: , Rfl:    Cholecalciferol 50 MCG (2000 UT) TABS, Take 2,000 Units by mouth., Disp: , Rfl:    diltiazem (CARDIZEM) 120 MG tablet, Take 120 mg by mouth., Disp: , Rfl:    fluticasone-salmeterol (WIXELA INHUB) 250-50 MCG/ACT AEPB, Inhale 1 puff into the lungs in the morning and at bedtime., Disp: , Rfl:    furosemide  (LASIX ) 20 MG tablet, Take 40 mg by mouth daily., Disp: , Rfl:    letrozole  (FEMARA ) 2.5 MG tablet, Take 1 tablet (2.5 mg total) by mouth daily., Disp: 30  tablet, Rfl: 6   losartan (COZAAR) 50 MG tablet, Take 50 mg by mouth daily., Disp: , Rfl:    metFORMIN (GLUCOPHAGE-XR) 500 MG 24 hr tablet, Take 500 mg by mouth., Disp: , Rfl:    potassium chloride (K-DUR,KLOR-CON) 10 MEQ tablet, Take 20 mEq by mouth daily., Disp: , Rfl:    predniSONE  (DELTASONE ) 2.5 MG tablet, Take 2.5 mg by mouth daily with breakfast., Disp: , Rfl:    traMADol  (ULTRAM ) 50 MG tablet, Take 1 tablet (50 mg total) by mouth every 8 (eight) hours as needed., Disp: 6 tablet, Rfl: 0  Physical exam:  Vitals:   04/25/24 0955  BP: 118/74  Pulse: 73  Resp: 16  Temp: 98.6 F (37 C)  SpO2: 92%  Weight: 180 lb 4.8 oz (81.8 kg)   Physical Exam Cardiovascular:     Rate and Rhythm: Normal rate and regular rhythm.     Heart sounds: Normal heart sounds.  Pulmonary:     Effort: Pulmonary effort is normal.     Breath sounds: Normal breath sounds.  Abdominal:     General: Bowel sounds are normal.     Palpations: Abdomen is soft.  Skin:    General: Skin is warm and dry.  Neurological:     Mental Status: She is alert and oriented to person, place, and time.    Breast exam was performed in seated and lying down position. Patient is status post left lumpectomy with a well-healed surgical scar. No evidence of any palpable masses. No evidence of axillary adenopathy. No evidence of any palpable masses or lumps in the right breast. No evidence of right axillary adenopathy   I have personally reviewed labs listed below:    Latest Ref Rng & Units 04/25/2024    9:43 AM  CMP  Glucose 70 - 99 mg/dL 83   BUN 8 - 23 mg/dL 11   Creatinine 9.55 - 1.00 mg/dL 9.12   Sodium 864 - 854 mmol/L 136   Potassium 3.5 - 5.1 mmol/L 3.5   Chloride 98 - 111 mmol/L 103   CO2 22 - 32 mmol/L 24   Calcium  8.9 - 10.3 mg/dL 8.8   Total Protein 6.5 - 8.1 g/dL 7.2   Total Bilirubin 0.0 - 1.2 mg/dL 1.4   Alkaline Phos 38 - 126 U/L 79   AST 15 - 41 U/L 27   ALT 0 - 44 U/L 20  Latest Ref Rng & Units  04/25/2024    9:43 AM  CBC  WBC 4.0 - 10.5 K/uL 8.2   Hemoglobin 12.0 - 15.0 g/dL 85.2   Hematocrit 63.9 - 46.0 % 46.1   Platelets 150 - 400 K/uL 231    I have personally reviewed Radiology images listed below: No images are attached to the encounter.  DG Bone Density Result Date: 04/14/2024 EXAM: DUAL X-RAY ABSORPTIOMETRY (DXA) FOR BONE MINERAL DENSITY 04/12/2024 2:02 pm CLINICAL DATA:  68 year old Female Postmenopausal. DCIS Patient is or has been on glucocorticoid therapy. TECHNIQUE: An axial (e.g., hips, spine) and/or appendicular (e.g., radius) exam was performed, as appropriate, using GE Secretary/administrator at [facility]. Images are obtained for bone mineral density measurement and are not obtained for diagnostic purposes. [id number] Exclusions: None. COMPARISON:  None. FINDINGS: Scan quality: Good. LUMBAR SPINE (L1-L4): BMD (in g/cm2): 1.073 T-score: -1.0 Z-score: 0.0 LEFT FEMORAL NECK: BMD (in g/cm2): 0.864 T-score: -1.2 Z-score: -0.6 LEFT TOTAL HIP: BMD (in g/cm2): 0.912 T-score: -0.8 Z-score: -0.4 RIGHT FEMORAL NECK: BMD (in g/cm2): 0.889 T-score: -1.1 Z-score: -0.4 RIGHT TOTAL HIP: BMD (in g/cm2): 0.933 T-score: -0.6 Z-score: -0.2 FRAX 10-YEAR PROBABILITY OF FRACTURE: 10-year fracture risk is performed using the [employer] FRAX calculator based on patient-reported risk factors. Major osteoporotic fracture: 6.1% Hip fracture: 0.7% Other situations known to alter the reliability of the FRAX score should be considered when making treatment decisions, including chronic glucocorticoid use and past treatments. Further guidance on treatment can be found at the [facility] Foundation's website https://www.patton.com/. IMPRESSION: Osteopenia based on BMD. Fracture risk is increased. Increased risk is based on low BMD. RECOMMENDATIONS: 1. All patients should optimize calcium  and vitamin D intake. 2. Consider FDA-approved medical therapies in postmenopausal women and  men aged 15 years and older, based on the following: - A hip or vertebral (clinical or morphometric) fracture - T-score less than or equal to -2.5 and secondary causes have been excluded. - Low bone mass (T-score between -1.0 and -2.5) and a 10-year probability of a hip fracture greater than or equal to 3% or a 10-year probability of a major osteoporosis-related fracture greater than or equal to 20% based on the [place] -adapted WHO algorithm. - Clinician judgment and/or patient preferences may indicate treatment for people with 10-year fracture probabilities above or below these levels 3. Patients with diagnosis of osteoporosis or at high risk for fracture should have regular bone mineral density tests. For patients eligible for Medicare, routine testing is allowed once every 2 years. The testing frequency can be increased to one year for patients who have rapidly progressing disease, those who are receiving or discontinuing medical therapy to restore bone mass, or have additional risk factors. Electronically Signed   By: Alm Parkins M.D.   On: 04/14/2024 07:44     Assessment and plan- Patient is a 68 y.o. female with history of left breast DCIS ER positive presently on letrozole  here for routine follow-up  Patient is doing well with no concerning signs and symptoms of recurrence based on today's exam.  She is tolerating letrozole  well and I have encouraged her to take calcium  1200 mg along with vitamin D.  I will see her back in 4 months no labs.  She will be due for a repeat mammogram in April of next year. Discussed the risks of bone density scan with the patient which shows a T-score of -1.2 in the left hip.  10-year risk of major  osteoporotic fracture was 6.1% and hip fracture is 0.7% which is not significant enough to warrant the use of bisphosphonates at this time.   Visit Diagnosis 1. High risk medication use   2. Use of letrozole  (Femara )   3. Encounter for follow-up surveillance of ductal carcinoma  in situ (DCIS) of breast      Dr. Annah Skene, MD, MPH [facility] [phone] 04/25/2024 12:56 PM

## 2024-06-07 ENCOUNTER — Emergency Department

## 2024-06-07 ENCOUNTER — Other Ambulatory Visit: Payer: Self-pay

## 2024-06-07 ENCOUNTER — Inpatient Hospital Stay
Admission: EM | Admit: 2024-06-07 | Discharge: 2024-06-11 | DRG: 871 | Disposition: A | Discharge status: Home / Self Care | Attending: Osteopathic Medicine | Admitting: Osteopathic Medicine

## 2024-06-07 DIAGNOSIS — Z7951 Long term (current) use of inhaled steroids: Secondary | ICD-10-CM

## 2024-06-07 DIAGNOSIS — J189 Pneumonia, unspecified organism: Secondary | ICD-10-CM | POA: Diagnosis not present

## 2024-06-07 DIAGNOSIS — Z8249 Family history of ischemic heart disease and other diseases of the circulatory system: Secondary | ICD-10-CM | POA: Diagnosis not present

## 2024-06-07 DIAGNOSIS — I5032 Chronic diastolic (congestive) heart failure: Secondary | ICD-10-CM | POA: Diagnosis present

## 2024-06-07 DIAGNOSIS — Z6831 Body mass index (BMI) 31.0-31.9, adult: Secondary | ICD-10-CM

## 2024-06-07 DIAGNOSIS — Z881 Allergy status to other antibiotic agents status: Secondary | ICD-10-CM | POA: Diagnosis not present

## 2024-06-07 DIAGNOSIS — J9621 Acute and chronic respiratory failure with hypoxia: Secondary | ICD-10-CM | POA: Diagnosis present

## 2024-06-07 DIAGNOSIS — E119 Type 2 diabetes mellitus without complications: Secondary | ICD-10-CM | POA: Diagnosis present

## 2024-06-07 DIAGNOSIS — Z7984 Long term (current) use of oral hypoglycemic drugs: Secondary | ICD-10-CM | POA: Diagnosis not present

## 2024-06-07 DIAGNOSIS — M301 Polyarteritis with lung involvement [Churg-Strauss]: Secondary | ICD-10-CM | POA: Diagnosis present

## 2024-06-07 DIAGNOSIS — R0600 Dyspnea, unspecified: Secondary | ICD-10-CM | POA: Diagnosis not present

## 2024-06-07 DIAGNOSIS — J159 Unspecified bacterial pneumonia: Secondary | ICD-10-CM | POA: Diagnosis present

## 2024-06-07 DIAGNOSIS — I288 Other diseases of pulmonary vessels: Secondary | ICD-10-CM | POA: Diagnosis present

## 2024-06-07 DIAGNOSIS — Z7952 Long term (current) use of systemic steroids: Secondary | ICD-10-CM

## 2024-06-07 DIAGNOSIS — I110 Hypertensive heart disease with heart failure: Secondary | ICD-10-CM | POA: Diagnosis present

## 2024-06-07 DIAGNOSIS — E66811 Obesity, class 1: Secondary | ICD-10-CM | POA: Diagnosis present

## 2024-06-07 DIAGNOSIS — Z9981 Dependence on supplemental oxygen: Secondary | ICD-10-CM

## 2024-06-07 DIAGNOSIS — E785 Hyperlipidemia, unspecified: Secondary | ICD-10-CM | POA: Diagnosis present

## 2024-06-07 DIAGNOSIS — Z853 Personal history of malignant neoplasm of breast: Secondary | ICD-10-CM

## 2024-06-07 DIAGNOSIS — Z90710 Acquired absence of both cervix and uterus: Secondary | ICD-10-CM

## 2024-06-07 DIAGNOSIS — Z79811 Long term (current) use of aromatase inhibitors: Secondary | ICD-10-CM

## 2024-06-07 DIAGNOSIS — Z7982 Long term (current) use of aspirin: Secondary | ICD-10-CM | POA: Diagnosis not present

## 2024-06-07 DIAGNOSIS — Z1152 Encounter for screening for COVID-19: Secondary | ICD-10-CM | POA: Diagnosis not present

## 2024-06-07 DIAGNOSIS — A419 Sepsis, unspecified organism: Secondary | ICD-10-CM | POA: Diagnosis present

## 2024-06-07 DIAGNOSIS — I10 Essential (primary) hypertension: Secondary | ICD-10-CM | POA: Diagnosis present

## 2024-06-07 DIAGNOSIS — Z880 Allergy status to penicillin: Secondary | ICD-10-CM

## 2024-06-07 DIAGNOSIS — J8410 Pulmonary fibrosis, unspecified: Secondary | ICD-10-CM | POA: Diagnosis present

## 2024-06-07 DIAGNOSIS — Z79899 Other long term (current) drug therapy: Secondary | ICD-10-CM

## 2024-06-07 DIAGNOSIS — R0602 Shortness of breath: Secondary | ICD-10-CM | POA: Diagnosis present

## 2024-06-07 DIAGNOSIS — J188 Other pneumonia, unspecified organism: Principal | ICD-10-CM

## 2024-06-07 DIAGNOSIS — J849 Interstitial pulmonary disease, unspecified: Secondary | ICD-10-CM

## 2024-06-07 LAB — COMPREHENSIVE METABOLIC PANEL WITH GFR
ALT: 32 U/L (ref 0–44)
AST: 69 U/L — ABNORMAL HIGH (ref 15–41)
Albumin: 3.8 g/dL (ref 3.5–5.0)
Alkaline Phosphatase: 107 U/L (ref 38–126)
Anion gap: 13 (ref 5–15)
BUN: 10 mg/dL (ref 8–23)
CO2: 24 mmol/L (ref 22–32)
Calcium: 9.6 mg/dL (ref 8.9–10.3)
Chloride: 102 mmol/L (ref 98–111)
Creatinine, Ser: 0.59 mg/dL (ref 0.44–1.00)
GFR, Estimated: >60 mL/min (ref >60)
Glucose, Bld: 93 mg/dL (ref 70–99)
Potassium: 4.1 mmol/L (ref 3.5–5.1)
Sodium: 139 mmol/L (ref 135–145)
Total Bilirubin: 2 mg/dL — ABNORMAL HIGH (ref 0.0–1.2)
Total Protein: 8.5 g/dL — ABNORMAL HIGH (ref 6.5–8.1)

## 2024-06-07 LAB — CBC WITH DIFFERENTIAL/PLATELET
Abs Immature Granulocytes: 0.06 K/uL (ref 0.00–0.07)
Basophils Absolute: 0.1 K/uL (ref 0.0–0.1)
Basophils Relative: 1 %
Eosinophils Absolute: 0.1 K/uL (ref 0.0–0.5)
Eosinophils Relative: 1 %
HCT: 49 % — ABNORMAL HIGH (ref 36.0–46.0)
Hemoglobin: 15.3 g/dL — ABNORMAL HIGH (ref 12.0–15.0)
Immature Granulocytes: 1 %
Lymphocytes Relative: 20 %
Lymphs Abs: 2.6 K/uL (ref 0.7–4.0)
MCH: 26.5 pg (ref 26.0–34.0)
MCHC: 31.2 g/dL (ref 30.0–36.0)
MCV: 84.8 fL (ref 80.0–100.0)
Monocytes Absolute: 1.1 K/uL — ABNORMAL HIGH (ref 0.1–1.0)
Monocytes Relative: 9 %
Neutro Abs: 8.8 K/uL — ABNORMAL HIGH (ref 1.7–7.7)
Neutrophils Relative %: 68 %
Platelets: UNDETERMINED K/uL (ref 150–400)
RBC: 5.78 MIL/uL — ABNORMAL HIGH (ref 3.87–5.11)
RDW: 15.7 % — ABNORMAL HIGH (ref 11.5–15.5)
WBC: 12.7 K/uL — ABNORMAL HIGH (ref 4.0–10.5)
nRBC: 0 % (ref 0.0–0.2)

## 2024-06-07 LAB — BLOOD GAS, VENOUS
Acid-Base Excess: 3.5 mmol/L — ABNORMAL HIGH (ref 0.0–2.0)
Bicarbonate: 29.1 mmol/L — ABNORMAL HIGH (ref 20.0–28.0)
O2 Saturation: 68 %
Patient temperature: 37
pCO2, Ven: 47 mmHg (ref 44–60)
pH, Ven: 7.4 (ref 7.25–7.43)
pO2, Ven: 41 mmHg (ref 32–45)

## 2024-06-07 LAB — BRAIN NATRIURETIC PEPTIDE: B Natriuretic Peptide: 61.9 pg/mL (ref 0.0–100.0)

## 2024-06-07 LAB — URINALYSIS, ROUTINE W REFLEX MICROSCOPIC
Bilirubin Urine: NEGATIVE
Glucose, UA: NEGATIVE mg/dL
Hgb urine dipstick: NEGATIVE
Ketones, ur: NEGATIVE mg/dL
Leukocytes,Ua: NEGATIVE
Nitrite: NEGATIVE
Protein, ur: NEGATIVE mg/dL
Specific Gravity, Urine: 1.006 (ref 1.005–1.030)
pH: 6 (ref 5.0–8.0)

## 2024-06-07 LAB — RESP PANEL BY RT-PCR (RSV, FLU A&B, COVID)  RVPGX2
Influenza A by PCR: NEGATIVE
Influenza B by PCR: NEGATIVE
Resp Syncytial Virus by PCR: NEGATIVE
SARS Coronavirus 2 by RT PCR: NEGATIVE

## 2024-06-07 LAB — TROPONIN I (HIGH SENSITIVITY)
Troponin I (High Sensitivity): 6 ng/L (ref <18)
Troponin I (High Sensitivity): 7 ng/L (ref <18)

## 2024-06-07 MED ORDER — SODIUM CHLORIDE 0.9 % IV SOLN
2.0000 g | Freq: Once | INTRAVENOUS | Status: AC
  Administered 2024-06-07: 2 g via INTRAVENOUS
  Filled 2024-06-07: qty 12.5

## 2024-06-07 MED ORDER — ASPIRIN 325 MG PO TABS
325.0000 mg | ORAL_TABLET | Freq: Once | ORAL | Status: DC
Start: 2024-06-07 — End: 2024-06-07
  Filled 2024-06-07: qty 1

## 2024-06-07 MED ORDER — SODIUM CHLORIDE 0.9 % IV SOLN
500.0000 mg | INTRAVENOUS | Status: DC
  Administered 2024-06-08: 500 mg via INTRAVENOUS
  Filled 2024-06-07: qty 5

## 2024-06-07 MED ORDER — IOHEXOL 350 MG/ML SOLN
75.0000 mL | Freq: Once | INTRAVENOUS | Status: AC | PRN
  Administered 2024-06-07: 75 mL via INTRAVENOUS

## 2024-06-07 MED ORDER — MORPHINE SULFATE (PF) 2 MG/ML IV SOLN
2.0000 mg | Freq: Once | INTRAVENOUS | Status: AC
  Administered 2024-06-07: 2 mg via INTRAVENOUS
  Filled 2024-06-07: qty 1

## 2024-06-07 NOTE — ED Notes (Signed)
 Patient transported to CT

## 2024-06-07 NOTE — ED Triage Notes (Signed)
 Pt to ED from home for SOB. Pt is on CPAP via EMS. Pt doesnt appear to be in distress. HC of CHF. Bi;ateral fluid in lungs. 86% on chronic 3 liters at home 170/ now 140 Hr in 80s with chest discomfort.  EKG unremarkable., Pt is caox4. Nomrmally takes lasix . Took it late today.

## 2024-06-07 NOTE — ED Provider Notes (Signed)
 [facility] Provider Note    Event Date/Time   First MD Initiated Contact with Patient 06/07/24 2048     (approximate)   History   Shortness of Breath   HPI  Erika Schmidt is a 68 year old female with history of vasculitis and interstitial lung disease, CHF, breast cancer presenting to the emergency department for evaluation of shortness of breath.  Patient wears 3 L oxygen at baseline.  Reports worsening shortness of breath starting this morning.  With EMS, patient was found to have labored respirations with an O2 sat of 86% on her 3 L.  She was placed on CPAP with significant improvement in her work of breathing and improvement in her sats to the upper 90s.     Physical Exam   Triage Vital Signs: ED Triage Vitals  Encounter Vitals Group     BP 06/07/24 2041 (!) 176/73     Girls Systolic BP Percentile --      Girls Diastolic BP Percentile --      Boys Systolic BP Percentile --      Boys Diastolic BP Percentile --      Pulse Rate 06/07/24 2040 82     Resp 06/07/24 2040 (!) 22     Temp 06/07/24 2041 98 F (36.7 C)     Temp Source 06/07/24 2041 Axillary     SpO2 06/07/24 2040 100 %     Weight --      Height 06/07/24 2038 5' 3 (1.6 m)     Head Circumference --      Peak Flow --      Pain Score 06/07/24 2038 0     Pain Loc --      Pain Education --      Exclude from Growth Chart --     Most recent vital signs: Vitals:   06/07/24 2130 06/07/24 2200  BP: (!) 155/98 (!) 145/76  Pulse: 79 74  Resp: (!) 27 14  Temp:    SpO2: 97% 100%     General: Awake, interactive  CV:  Good peripheral perfusion Resp:  Tachypneic with mildly labored respirations, transitioned to BiPAP Abd:  Nondistended.  Neuro:  Moving extremities spontaneously, fluid speech    ED Results / Procedures / Treatments   Labs (all labs ordered are listed, but only abnormal results are displayed) Labs Reviewed  CBC WITH DIFFERENTIAL/PLATELET - Abnormal; Notable  for the following components:      Result Value   WBC 12.7 (*)    RBC 5.78 (*)    Hemoglobin 15.3 (*)    HCT 49.0 (*)    RDW 15.7 (*)    Neutro Abs 8.8 (*)    Monocytes Absolute 1.1 (*)    All other components within normal limits  COMPREHENSIVE METABOLIC PANEL WITH GFR - Abnormal; Notable for the following components:   Total Protein 8.5 (*)    AST 69 (*)    Total Bilirubin 2.0 (*)    All other components within normal limits  BLOOD GAS, VENOUS - Abnormal; Notable for the following components:   Bicarbonate 29.1 (*)    Acid-Base Excess 3.5 (*)    All other components within normal limits  URINALYSIS, ROUTINE W REFLEX MICROSCOPIC - Abnormal; Notable for the following components:   Color, Urine STRAW (*)    APPearance CLEAR (*)    All other components within normal limits  RESP PANEL BY RT-PCR (RSV, FLU A&B, COVID)  [id number]  BRAIN NATRIURETIC PEPTIDE  TROPONIN I (HIGH SENSITIVITY)  TROPONIN I (HIGH SENSITIVITY)     EKG EKG independently reviewed and interpreted by myself demonstrates:  EKG demonstrates sinus rhythm at a rate 84, PR 150, QRS 92, QTc 452, no acute ST changes  RADIOLOGY Imaging independently reviewed and interpreted by myself demonstrates:  CXR without focal consolidation  CTA without PE but is concerning for superimposed pneumonia over her underlying lung disease  Formal Radiology Read:  CT Angio Chest PE W and/or Wo Contrast Result Date: 06/07/2024 CLINICAL DATA:  Shortness of breath EXAM: CT ANGIOGRAPHY CHEST WITH CONTRAST TECHNIQUE: Multidetector CT imaging of the chest was performed using the standard protocol during bolus administration of intravenous contrast. Multiplanar CT image reconstructions and MIPs were obtained to evaluate the vascular anatomy. RADIATION DOSE REDUCTION: This exam was performed according to the departmental dose-optimization program which includes automated exposure control, adjustment of the mA and/or kV according to patient size  and/or use of iterative reconstruction technique. CONTRAST:  75mL OMNIPAQUE IOHEXOL 350 MG/ML SOLN COMPARISON:  Chest x-Renise Gillies 06/07/2024, chest CT 02/18/2023, 04/03/2022 FINDINGS: Cardiovascular: Satisfactory opacification of the pulmonary arteries to the segmental level. No evidence of pulmonary embolism. Nonaneurysmal aorta. No dissection. Mild atherosclerosis. Borderline cardiomegaly. No significant pericardial effusion Mediastinum/Nodes: Patent trachea. No thyroid mass. Enlarged right paratracheal node measuring 18 mm. Left anterior hilar node measuring 12 mm. Small right hilar nodes measuring up to 14 mm. Esophagus is within normal limits. Lungs/Pleura: Chronic fibrotic lung disease and areas of ground-glass density. Mild bronchiectasis in the lower lobes. Increased ground-glass disease within the bilateral lungs, most evident in the right upper lobe compared to prior. Negative for pleural effusion or pneumothorax. Upper Abdomen: Gallstones Musculoskeletal: Irregular collection in left breast with some fat density within likely due to lumpectomy change. No acute osseous abnormality. Review of the MIP images confirms the above findings. IMPRESSION: 1. Negative for acute pulmonary embolus or aortic dissection. 2. Chronic fibrotic lung disease. Slight increased ground-glass disease within the bilateral lungs, most evident in the right upper lobe compared to prior, difficult to exclude superimposed post acute infection versus progression of chronic interstitial lung disease. 3. Mild mediastinal and hilar adenopathy, likely reactive, but attention on follow-up imaging. 4. Gallstones. 5. Aortic atherosclerosis. Aortic Atherosclerosis (ICD10-I70.0). Electronically Signed   By: Luke Bun M.D.   On: 06/07/2024 23:13   DG Chest Portable 1 View Result Date: 06/07/2024 EXAM: 1 VIEW(S) XRAY OF THE CHEST 06/07/2024 09:15:54 PM COMPARISON: Comparison is made to chest x-Kaiana Marion dated 10/07/2021 and chest CT dated 02/18/2023.  CLINICAL HISTORY: shortness of breath. Pt to ED from home for SOB. HC of CHF. FINDINGS: LUNGS AND PLEURA: Airspace disease in the right upper lobe persists. Fibrotic changes in the lung bases are unchanged. No new focal lung infiltrate. No pulmonary edema. No pleural effusion. No pneumothorax. HEART AND MEDIASTINUM: No acute abnormality of the cardiac and mediastinal silhouettes. BONES AND SOFT TISSUES: No acute osseous abnormality. IMPRESSION: 1. No acute findings. 2. Persistent airspace disease in the right upper lobe and unchanged fibrotic changes in the lung bases. Electronically signed by: Greig Pique MD 06/07/2024 09:20 PM EDT RP Workstation: [id number]    PROCEDURES:  Critical Care performed: Yes, see critical care procedure note(s)  CRITICAL CARE Performed by: Nilsa Dade   Total critical care time: 32 minutes  Critical care time was exclusive of separately billable procedures and treating other patients.  Critical care was necessary to treat or prevent imminent or life-threatening deterioration.  Critical care was time spent personally  by me on the following activities: development of treatment plan with patient and/or surrogate as well as nursing, discussions with consultants, evaluation of patient's response to treatment, examination of patient, obtaining history from patient or surrogate, ordering and performing treatments and interventions, ordering and review of laboratory studies, ordering and review of radiographic studies, pulse oximetry and re-evaluation of patient's condition.   Procedures   MEDICATIONS ORDERED IN ED: Medications  ceFEPIme (MAXIPIME) 2 g in sodium chloride  0.9 % 100 mL IVPB (has no administration in time range)  azithromycin (ZITHROMAX) 500 mg in sodium chloride  0.9 % 250 mL IVPB (has no administration in time range)  iohexol (OMNIPAQUE) 350 MG/ML injection 75 mL (75 mLs Intravenous Contrast Given 06/07/24 2253)  morphine  (PF) 2 MG/ML injection 2 mg (2 mg  Intravenous Given 06/07/24 2311)     IMPRESSION / MDM / ASSESSMENT AND PLAN / ED COURSE  I reviewed the triage vital signs and the nursing notes.  Differential diagnosis includes, but is not limited to, CHF exacerbation, pneumonia, ILD flare, lower suspicion ACS, PE  Patient's presentation is most consistent with acute presentation with potential threat to life or bodily function.  68 year old female presenting with shortness of breath.  Hypoxic with EMS on home oxygen, arrives on CPAP transition to BiPAP.  Will obtain labs, x-Lauramae Kneisley to further evaluate.  Also complex distal WBC 12.7, CMP without critical derangements.  VBG reassuring with normal pH and pCO2.  Negative initial troponin. Normal BNP. Viral swab negative.  X-Elain Wixon normal as below.  Patient able to be transitioned off of BiPAP, but continues to report chest pain and shortness of breath.  CTA and repeat troponin pending to further evaluate.  Clinical Course as of 06/07/24 2333  Wed Jun 07, 2024  2122 DG Chest Portable 1 View CXR without acute abnormality.  With acute change in respiratory status, will obtain CT of the chest to further evaluate for PE or other abnormalities. [NR]  2331 CTA without PE, does demonstrate findings concerning for superimposed pneumonia.  With her white count and tachypnea, patient does meet SIRS criteria.  Sepsis orders initiated with empiric antibiotics and small fluid bolus with her CHF history.  Will reach out to hospitalist team. [NR]    Clinical Course User Index [NR] Levander Slate, MD     FINAL CLINICAL IMPRESSION(S) / ED DIAGNOSES   Final diagnoses:  Multifocal pneumonia  Sepsis with acute hypoxic respiratory failure without septic shock, due to unspecified organism (HCC)  Interstitial lung disease (HCC)     Rx / DC Orders   ED Discharge Orders     None        Note:  This document was prepared using Dragon voice recognition software and may include unintentional dictation errors.    Levander Slate, MD 06/07/24 [phone]

## 2024-06-07 NOTE — ED Provider Notes (Signed)
-----------------------------------------   11:49 PM on 06/07/2024 -----------------------------------------  Consulted with Dr. Lawence with the hospitalist service who will admit the patient.   [name], MD 06/07/24 [phone]

## 2024-06-07 NOTE — H&P (Incomplete)
 Erika Schmidt   PATIENT NAME: Erika Schmidt    MR#:  [id number]  DATE OF BIRTH:  1956/06/13  DATE OF ADMISSION:  06/07/2024  PRIMARY CARE PHYSICIAN: Sherial Bail, MD   Patient is coming from: ***  REQUESTING/REFERRING PHYSICIAN: ***  CHIEF COMPLAINT:   Chief Complaint  Patient presents with  . Shortness of Breath    HISTORY OF PRESENT ILLNESS:  Erika Schmidt is a 68 y.o. female with medical history significant for ***  ED Course: *** EKG as reviewed by me : *** Imaging: *** PAST MEDICAL HISTORY:   Past Medical History:  Diagnosis Date  . Breast cancer (HCC)   . Bronchiectasis without complication (HCC)   . CHF (congestive heart failure) (HCC)   . Chronic hypoxemic respiratory failure (HCC)   . Dyspnea   . Hypertension   . ILD (interstitial lung disease) (HCC)   . Pre-diabetes   . Pulmonary fibrosis (HCC)     PAST SURGICAL HISTORY:   Past Surgical History:  Procedure Laterality Date  . ABDOMINAL HYSTERECTOMY    . AXILLARY SENTINEL NODE BIOPSY Left 12/30/2023   Procedure: BIOPSY, LYMPH NODE, SENTINEL, AXILLARY;  Surgeon: Tye Millet, DO;  Location: [facility];  Service: General;  Laterality: Left;  . BREAST BIOPSY Left 12/01/2023   MM LT BREAST BX W LOC DEV 1ST LESION IMAGE BX SPEC STEREO GUIDE 12/01/2023 [facility]  . BREAST BIOPSY Left 12/01/2023   MM LT BREAST BX W LOC DEV EA AD LESION IMG BX SPEC STEREO GUIDE 12/01/2023 [facility]  . BREAST BIOPSY Left 12/01/2023   US  LT BREAST BX W LOC DEV 1ST LESION IMG BX SPEC US  GUIDE 12/01/2023 [facility]  . BREAST BIOPSY Left 12/01/2023   US  LT BREAST BX W LOC DEV EA ADD LESION IMG BX SPEC US  GUIDE 12/01/2023 [facility]  . BREAST BIOPSY  12/22/2023   MM LT BREAST SAVI/RF TAG EA ADD'L LESION MAMMO GUIDE 12/22/2023 [facility]  . BREAST BIOPSY  12/22/2023   MM LT BREAST SAVI/RF TAG 1ST LESION MAMMO GUIDE 12/22/2023 [facility]  . BREAST LUMPECTOMY WITH RADIO FREQUENCY LOCALIZER  Left 12/30/2023   Procedure: BREAST LUMPECTOMY WITH RADIO FREQUENCY LOCALIZER;  Surgeon: Tye Millet, DO;  Location: [facility];  Service: General;  Laterality: Left;  . COLONOSCOPY WITH PROPOFOL  N/A 03/25/2015   Procedure: COLONOSCOPY WITH PROPOFOL ;  Surgeon: Gladis RAYMOND Mariner, MD;  Location: [facility];  Service: Endoscopy;  Laterality: N/A;  . TEE WITHOUT CARDIOVERSION N/A 03/26/2023   Procedure: TRANSESOPHAGEAL ECHOCARDIOGRAM (TEE);  Surgeon: Dewane Shiner, DO;  Location: [facility];  Service: Cardiovascular;  Laterality: N/A;    SOCIAL HISTORY:   Social History   Tobacco Use  . Smoking status: Never  . Smokeless tobacco: Never  Substance Use Topics  . Alcohol use: Not Currently    Comment: Occasionally    FAMILY HISTORY:   Family History  Problem Relation Age of Onset  . Stroke Father   . Congestive Heart Failure Sister   . Graves' disease Sister   . Prostate cancer Brother   . Heart attack Brother   . Heart attack Brother   . Aneurysm Paternal Grandmother   . Breast cancer Neg Hx     DRUG ALLERGIES:   Allergies  Allergen Reactions  . Augmentin [Amoxicillin-Pot Clavulanate] Diarrhea  . Floxin [Ofloxacin]     REVIEW OF SYSTEMS:   ROS As per history of present illness. All pertinent systems were reviewed above. Constitutional, HEENT, cardiovascular, respiratory, GI, GU, musculoskeletal, neuro, psychiatric,  endocrine, integumentary and hematologic systems were reviewed and are otherwise negative/unremarkable except for positive findings mentioned above in the HPI.   MEDICATIONS AT HOME:   Prior to Admission medications   Medication Sig Start Date End Date Taking? Authorizing Provider  acetaminophen  (TYLENOL ) 500 MG tablet Take 500 mg by mouth every 8 (eight) hours as needed.    [provider]  albuterol  (VENTOLIN  HFA) 108 (90 Base) MCG/ACT inhaler Inhale 2 puffs into the lungs every 6 (six) hours as needed for wheezing or shortness of breath. 06/27/19    Singh, Prashant K, MD  aspirin  81 MG tablet Take 1 tablet (81 mg total) by mouth daily. 07/12/19   Singh, Prashant K, MD  atorvastatin  (LIPITOR) 40 MG tablet Take 40 mg by mouth daily.    [provider]  cefdinir (OMNICEF) 300 MG capsule Take 300 mg by mouth 2 (two) times daily. 04/14/24   [provider]  Cholecalciferol 50 MCG (2000 UT) TABS Take 2,000 Units by mouth.    [provider]  diltiazem (CARDIZEM) 120 MG tablet Take 120 mg by mouth. 03/04/23   [provider]  fluticasone-salmeterol (WIXELA INHUB) 250-50 MCG/ACT AEPB Inhale 1 puff into the lungs in the morning and at bedtime.    [provider]  furosemide  (LASIX ) 20 MG tablet Take 40 mg by mouth daily.    [provider]  letrozole  (FEMARA ) 2.5 MG tablet Take 1 tablet (2.5 mg total) by mouth daily. 02/14/24   Melanee Annah BROCKS, MD  losartan (COZAAR) 50 MG tablet Take 50 mg by mouth daily.    [provider]  metFORMIN (GLUCOPHAGE-XR) 500 MG 24 hr tablet Take 500 mg by mouth. 09/22/23 09/21/24  [provider]  potassium chloride (K-DUR,KLOR-CON) 10 MEQ tablet Take 20 mEq by mouth daily.    [provider]  predniSONE  (DELTASONE ) 2.5 MG tablet Take 2.5 mg by mouth daily with breakfast. 12/29/23   [provider]  traMADol  (ULTRAM ) 50 MG tablet Take 1 tablet (50 mg total) by mouth every 8 (eight) hours as needed. 12/30/23 12/29/24  Tye Millet, DO      VITAL SIGNS:  Blood pressure (!) 145/76, pulse 74, temperature 98 F (36.7 C), temperature source Axillary, resp. rate 14, height 5' 3 (1.6 m), SpO2 100%.  PHYSICAL EXAMINATION:  Physical Exam  GENERAL:  68 y.o.-year-old patient lying lying in the bed with no acute distress.  EYES: Pupils equal, round, reactive to light and accommodation. No scleral icterus. Extraocular muscles intact.  HEENT: Head atraumatic, normocephalic. Oropharynx and nasopharynx clear.  NECK:  Supple, no jugular venous distention. No  thyroid enlargement, no tenderness.  LUNGS: Normal breath sounds bilaterally, no wheezing, rales,rhonchi or crepitation. No use of accessory muscles of respiration.  CARDIOVASCULAR: Regular rate and rhythm, S1, S2 normal. No murmurs, rubs, or gallops.  ABDOMEN: Soft, nondistended, nontender. Bowel sounds present. No organomegaly or mass.  EXTREMITIES: No pedal edema, cyanosis, or clubbing.  NEUROLOGIC: Cranial nerves II through XII are intact. Muscle strength 5/5 in all extremities. Sensation intact. Gait not checked.  PSYCHIATRIC: The patient is alert and oriented x 3.  Normal affect and good eye contact. SKIN: No obvious rash, lesion, or ulcer.   LABORATORY PANEL:   CBC Recent Labs  Lab 06/07/24 2200  WBC 12.7*  HGB 15.3*  HCT 49.0*  PLT PLATELET CLUMPS NOTED ON SMEAR, UNABLE TO ESTIMATE   ------------------------------------------------------------------------------------------------------------------  Chemistries  Recent Labs  Lab 06/07/24 2200  NA 139  K 4.1  CL 102  CO2 24  GLUCOSE 93  BUN 10  CREATININE 0.59  CALCIUM  9.6  AST 69*  ALT 32  ALKPHOS 107  BILITOT 2.0*   ------------------------------------------------------------------------------------------------------------------  Cardiac Enzymes No results for input(s): TROPONINI in the last 168 hours. ------------------------------------------------------------------------------------------------------------------  RADIOLOGY:  CT Angio Chest PE W and/or Wo Contrast Result Date: 06/07/2024 CLINICAL DATA:  Shortness of breath EXAM: CT ANGIOGRAPHY CHEST WITH CONTRAST TECHNIQUE: Multidetector CT imaging of the chest was performed using the standard protocol during bolus administration of intravenous contrast. Multiplanar CT image reconstructions and MIPs were obtained to evaluate the vascular anatomy. RADIATION DOSE REDUCTION: This exam was performed according to the departmental dose-optimization program which  includes automated exposure control, adjustment of the mA and/or kV according to patient size and/or use of iterative reconstruction technique. CONTRAST:  75mL OMNIPAQUE IOHEXOL 350 MG/ML SOLN COMPARISON:  Chest x-ray 06/07/2024, chest CT 02/18/2023, 04/03/2022 FINDINGS: Cardiovascular: Satisfactory opacification of the pulmonary arteries to the segmental level. No evidence of pulmonary embolism. Nonaneurysmal aorta. No dissection. Mild atherosclerosis. Borderline cardiomegaly. No significant pericardial effusion Mediastinum/Nodes: Patent trachea. No thyroid mass. Enlarged right paratracheal node measuring 18 mm. Left anterior hilar node measuring 12 mm. Small right hilar nodes measuring up to 14 mm. Esophagus is within normal limits. Lungs/Pleura: Chronic fibrotic lung disease and areas of ground-glass density. Mild bronchiectasis in the lower lobes. Increased ground-glass disease within the bilateral lungs, most evident in the right upper lobe compared to prior. Negative for pleural effusion or pneumothorax. Upper Abdomen: Gallstones Musculoskeletal: Irregular collection in left breast with some fat density within likely due to lumpectomy change. No acute osseous abnormality. Review of the MIP images confirms the above findings. IMPRESSION: 1. Negative for acute pulmonary embolus or aortic dissection. 2. Chronic fibrotic lung disease. Slight increased ground-glass disease within the bilateral lungs, most evident in the right upper lobe compared to prior, difficult to exclude superimposed post acute infection versus progression of chronic interstitial lung disease. 3. Mild mediastinal and hilar adenopathy, likely reactive, but attention on follow-up imaging. 4. Gallstones. 5. Aortic atherosclerosis. Aortic Atherosclerosis (ICD10-I70.0). Electronically Signed   By: Luke Bun M.D.   On: 06/07/2024 23:13   DG Chest Portable 1 View Result Date: 06/07/2024 EXAM: 1 VIEW(S) XRAY OF THE CHEST 06/07/2024 09:15:54 PM  COMPARISON: Comparison is made to chest x-ray dated 10/07/2021 and chest CT dated 02/18/2023. CLINICAL HISTORY: shortness of breath. Pt to ED from home for SOB. HC of CHF. FINDINGS: LUNGS AND PLEURA: Airspace disease in the right upper lobe persists. Fibrotic changes in the lung bases are unchanged. No new focal lung infiltrate. No pulmonary edema. No pleural effusion. No pneumothorax. HEART AND MEDIASTINUM: No acute abnormality of the cardiac and mediastinal silhouettes. BONES AND SOFT TISSUES: No acute osseous abnormality. IMPRESSION: 1. No acute findings. 2. Persistent airspace disease in the right upper lobe and unchanged fibrotic changes in the lung bases. Electronically signed by: Greig Pique MD 06/07/2024 09:20 PM EDT RP Workstation: [id number]      IMPRESSION AND PLAN:  Assessment and Plan: No notes have been filed under this hospital service. Service: Hospitalist      DVT prophylaxis: Lovenox ***  Advanced Care Planning:  Code Status: full code***  Family Communication:  The plan of care was discussed in details with the patient (and family). I answered all questions. The patient agreed to proceed with the above mentioned plan. Further management will depend upon hospital course. Disposition Plan: Back to previous home environment Consults called: none***  All  the records are reviewed and case discussed with ED provider.  Status is: Inpatient {Inpatient:23812}   At the time of the admission, it appears that the appropriate admission status for this patient is inpatient.  This is judged to be reasonable and necessary in order to provide the required intensity of service to ensure the patient's safety given the presenting symptoms, physical exam findings and initial radiographic and laboratory data in the context of comorbid conditions.  The patient requires inpatient status due to high intensity of service, high risk of further deterioration and high frequency of surveillance  required.  I certify that at the time of admission, it is my clinical judgment that the patient will require inpatient hospital care extending more than 2 midnights.                            Dispo: The patient is from: Home              Anticipated d/c is to: Home              Patient currently is not medically stable to d/c.              Difficult to place patient: No  Madison DELENA Peaches M.D on 06/07/2024 at 11:49 PM  [facility]   From 7 PM-7 AM, contact night-coverage [email or web address]  CC: Primary care physician; Sherial Bail, MD

## 2024-06-07 NOTE — H&P (Addendum)
 [place]   PATIENT NAME: Erika Schmidt    MR#:  [id number]  DATE OF BIRTH:  1956-02-17  DATE OF ADMISSION:  06/07/2024  PRIMARY CARE PHYSICIAN: Sherial Bail, MD   Patient is coming from: Home  REQUESTING/REFERRING PHYSICIAN: [name], MD/Ray, Neha, MD  CHIEF COMPLAINT:   Chief Complaint  Patient presents with   Shortness of Breath    HISTORY OF PRESENT ILLNESS:  Erika Schmidt is a 69 y.o. African-American female with medical history significant for CHF, breast cancer, on letrozole , chronic hypoxemic respiratory failure on home O2, pulm essential hypertension, pulmonary fibrosis/ILD and prediabetes, who presented to the emergency room with acute onset of worsening dyspnea and dry cough over the last 3 days with hypoxia on home O2.  The patient denied any fever or chills.  No nausea or vomiting or abdominal pain.  No dysuria, oliguria or hematuria or flank pain.  No chest pain or palpitations.  ED Course: When the patient came to the ER, BP was 176/73 with respiratory rate of 22 and Pulsoxymeter 100% on BiPAP at FiO2 of 30%.  Labs revealed an AST 69 and total protein of 8.5 with total bili of 2 otherwise unremarkable CMP.  BN P was 61.9.  High sensitive troponin I was 607.  Lactic acid was 1.1 and later 2.9.  CBC showed leukocytosis of 12.7 with neutrophilia and hemoconcentration.  UA is negative blood cultures were drawn.. EKG as reviewed by me : EKG showed normal sinus rhythm with a rate of 84, right axis deviation and low voltage QRS. Imaging: CTA of the chest revealed no evidence for PE or aortic dissection.  Showed chronic fibrotic lung disease and increased ground glass disease in both lungs most evident in the right upper lobe compared to prior.  It was difficult to exclude superimposed postacute and infection versus progression of chronic interstitial lung disease.  It showed medial mediastinal and hilar adenopathy likely reactive.  It also showed  gallstones and aortic atherosclerosis.  The patient was given 2 mg of IV morphine  sulfate and IV cefepime.  The patient will be admitted to a progressive unit bed for further evaluation and management.  PAST MEDICAL HISTORY:   Past Medical History:  Diagnosis Date   Breast cancer (HCC)    Bronchiectasis without complication (HCC)    CHF (congestive heart failure) (HCC)    Chronic hypoxemic respiratory failure (HCC)    Dyspnea    Hypertension    ILD (interstitial lung disease) (HCC)    Pre-diabetes    Pulmonary fibrosis (HCC)     PAST SURGICAL HISTORY:   Past Surgical History:  Procedure Laterality Date   ABDOMINAL HYSTERECTOMY     AXILLARY SENTINEL NODE BIOPSY Left 12/30/2023   Procedure: BIOPSY, LYMPH NODE, SENTINEL, AXILLARY;  Surgeon: Tye Millet, DO;  Location: [facility];  Service: General;  Laterality: Left;   BREAST BIOPSY Left 12/01/2023   MM LT BREAST BX W LOC DEV 1ST LESION IMAGE BX SPEC STEREO GUIDE 12/01/2023 [facility]   BREAST BIOPSY Left 12/01/2023   MM LT BREAST BX W LOC DEV EA AD LESION IMG BX SPEC STEREO GUIDE 12/01/2023 [facility]   BREAST BIOPSY Left 12/01/2023   US  LT BREAST BX W LOC DEV 1ST LESION IMG BX SPEC US  GUIDE 12/01/2023 [facility]   BREAST BIOPSY Left 12/01/2023   US  LT BREAST BX W LOC DEV EA ADD LESION IMG BX SPEC US  GUIDE 12/01/2023 [facility]   BREAST BIOPSY  12/22/2023  MM LT BREAST SAVI/RF TAG EA ADD'L LESION MAMMO GUIDE 12/22/2023 [facility]   BREAST BIOPSY  12/22/2023   MM LT BREAST SAVI/RF TAG 1ST LESION MAMMO GUIDE 12/22/2023 [facility]   BREAST LUMPECTOMY WITH RADIO FREQUENCY LOCALIZER Left 12/30/2023   Procedure: BREAST LUMPECTOMY WITH RADIO FREQUENCY LOCALIZER;  Surgeon: Tye Millet, DO;  Location: [facility];  Service: General;  Laterality: Left;   COLONOSCOPY WITH PROPOFOL  N/A 03/25/2015   Procedure: COLONOSCOPY WITH PROPOFOL ;  Surgeon: Gladis RAYMOND Mariner, MD;  Location: [facility];  Service: Endoscopy;   Laterality: N/A;   TEE WITHOUT CARDIOVERSION N/A 03/26/2023   Procedure: TRANSESOPHAGEAL ECHOCARDIOGRAM (TEE);  Surgeon: Dewane Shiner, DO;  Location: [facility];  Service: Cardiovascular;  Laterality: N/A;    SOCIAL HISTORY:   Social History   Tobacco Use   Smoking status: Never   Smokeless tobacco: Never  Substance Use Topics   Alcohol use: Not Currently    Comment: Occasionally    FAMILY HISTORY:   Family History  Problem Relation Age of Onset   Stroke Father    Congestive Heart Failure Sister    Graves' disease Sister    Prostate cancer Brother    Heart attack Brother    Heart attack Brother    Aneurysm Paternal Grandmother    Breast cancer Neg Hx     DRUG ALLERGIES:   Allergies  Allergen Reactions   Augmentin [Amoxicillin-Pot Clavulanate] Diarrhea   Floxin [Ofloxacin]     REVIEW OF SYSTEMS:   ROS As per history of present illness. All pertinent systems were reviewed above. Constitutional, HEENT, cardiovascular, respiratory, GI, GU, musculoskeletal, neuro, psychiatric, endocrine, integumentary and hematologic systems were reviewed and are otherwise negative/unremarkable except for positive findings mentioned above in the HPI.   MEDICATIONS AT HOME:   Prior to Admission medications   Medication Sig Start Date End Date Taking? Authorizing Provider  acetaminophen  (TYLENOL ) 500 MG tablet Take 500 mg by mouth every 8 (eight) hours as needed.    [provider]  albuterol  (VENTOLIN  HFA) 108 (90 Base) MCG/ACT inhaler Inhale 2 puffs into the lungs every 6 (six) hours as needed for wheezing or shortness of breath. 06/27/19   Singh, Prashant K, MD  aspirin  81 MG tablet Take 1 tablet (81 mg total) by mouth daily. 07/12/19   Singh, Prashant K, MD  atorvastatin  (LIPITOR) 40 MG tablet Take 40 mg by mouth daily.    [provider]  cefdinir (OMNICEF) 300 MG capsule Take 300 mg by mouth 2 (two) times daily. 04/14/24   [provider]  Cholecalciferol  50 MCG (2000 UT) TABS Take 2,000 Units by mouth.    [provider]  diltiazem (CARDIZEM) 120 MG tablet Take 120 mg by mouth. 03/04/23   [provider]  fluticasone-salmeterol (WIXELA INHUB) 250-50 MCG/ACT AEPB Inhale 1 puff into the lungs in the morning and at bedtime.    [provider]  furosemide  (LASIX ) 20 MG tablet Take 40 mg by mouth daily.    [provider]  letrozole  (FEMARA ) 2.5 MG tablet Take 1 tablet (2.5 mg total) by mouth daily. 02/14/24   Melanee Annah BROCKS, MD  losartan (COZAAR) 50 MG tablet Take 50 mg by mouth daily.    [provider]  metFORMIN (GLUCOPHAGE-XR) 500 MG 24 hr tablet Take 500 mg by mouth. 09/22/23 09/21/24  [provider]  potassium chloride (K-DUR,KLOR-CON) 10 MEQ tablet Take 20 mEq by mouth daily.    [provider]  predniSONE  (DELTASONE ) 2.5 MG tablet Take  2.5 mg by mouth daily with breakfast. 12/29/23   [provider]  traMADol  (ULTRAM ) 50 MG tablet Take 1 tablet (50 mg total) by mouth every 8 (eight) hours as needed. 12/30/23 12/29/24  Tye Millet, DO      VITAL SIGNS:  Blood pressure (!) 114/59, pulse 85, temperature 98.5 F (36.9 C), resp. rate 18, height 5' 3 (1.6 m), weight 80.7 kg, SpO2 98%.  PHYSICAL EXAMINATION:  Physical Exam  GENERAL:  68 y.o.-year-old African-American female patient lying in the bed with no acute distress.  EYES: Pupils equal, round, reactive to light and accommodation. No scleral icterus. Extraocular muscles intact.  HEENT: Head atraumatic, normocephalic. Oropharynx and nasopharynx clear.  NECK:  Supple, no jugular venous distention. No thyroid enlargement, no tenderness.  LUNGS: Diminished bibasilar breath sounds with bibasilar crackles.. No use of accessory muscles of respiration.  CARDIOVASCULAR: Regular rate and rhythm, S1, S2 normal. No murmurs, rubs, or gallops.  ABDOMEN: Soft, nondistended, nontender. Bowel sounds present. No organomegaly or mass.   EXTREMITIES: No pedal edema, cyanosis, or clubbing.  NEUROLOGIC: Cranial nerves II through XII are intact. Muscle strength 5/5 in all extremities. Sensation intact. Gait not checked.  PSYCHIATRIC: The patient is alert and oriented x 3.  Normal affect and good eye contact. SKIN: No obvious rash, lesion, or ulcer.   LABORATORY PANEL:   CBC Recent Labs  Lab 06/07/24 2200  WBC 12.7*  HGB 15.3*  HCT 49.0*  PLT PLATELET CLUMPS NOTED ON SMEAR, UNABLE TO ESTIMATE   ------------------------------------------------------------------------------------------------------------------  Chemistries  Recent Labs  Lab 06/07/24 2200  NA 139  K 4.1  CL 102  CO2 24  GLUCOSE 93  BUN 10  CREATININE 0.59  CALCIUM  9.6  AST 69*  ALT 32  ALKPHOS 107  BILITOT 2.0*   ------------------------------------------------------------------------------------------------------------------  Cardiac Enzymes No results for input(s): TROPONINI in the last 168 hours. ------------------------------------------------------------------------------------------------------------------  RADIOLOGY:  CT Angio Chest PE W and/or Wo Contrast Result Date: 06/07/2024 CLINICAL DATA:  Shortness of breath EXAM: CT ANGIOGRAPHY CHEST WITH CONTRAST TECHNIQUE: Multidetector CT imaging of the chest was performed using the standard protocol during bolus administration of intravenous contrast. Multiplanar CT image reconstructions and MIPs were obtained to evaluate the vascular anatomy. RADIATION DOSE REDUCTION: This exam was performed according to the departmental dose-optimization program which includes automated exposure control, adjustment of the mA and/or kV according to patient size and/or use of iterative reconstruction technique. CONTRAST:  75mL OMNIPAQUE IOHEXOL 350 MG/ML SOLN COMPARISON:  Chest x-ray 06/07/2024, chest CT 02/18/2023, 04/03/2022 FINDINGS: Cardiovascular: Satisfactory opacification of the pulmonary arteries to  the segmental level. No evidence of pulmonary embolism. Nonaneurysmal aorta. No dissection. Mild atherosclerosis. Borderline cardiomegaly. No significant pericardial effusion Mediastinum/Nodes: Patent trachea. No thyroid mass. Enlarged right paratracheal node measuring 18 mm. Left anterior hilar node measuring 12 mm. Small right hilar nodes measuring up to 14 mm. Esophagus is within normal limits. Lungs/Pleura: Chronic fibrotic lung disease and areas of ground-glass density. Mild bronchiectasis in the lower lobes. Increased ground-glass disease within the bilateral lungs, most evident in the right upper lobe compared to prior. Negative for pleural effusion or pneumothorax. Upper Abdomen: Gallstones Musculoskeletal: Irregular collection in left breast with some fat density within likely due to lumpectomy change. No acute osseous abnormality. Review of the MIP images confirms the above findings. IMPRESSION: 1. Negative for acute pulmonary embolus or aortic dissection. 2. Chronic fibrotic lung disease. Slight increased ground-glass disease within the bilateral lungs, most evident in the right upper lobe compared to prior,  difficult to exclude superimposed post acute infection versus progression of chronic interstitial lung disease. 3. Mild mediastinal and hilar adenopathy, likely reactive, but attention on follow-up imaging. 4. Gallstones. 5. Aortic atherosclerosis. Aortic Atherosclerosis (ICD10-I70.0). Electronically Signed   By: Luke Bun M.D.   On: 06/07/2024 23:13   DG Chest Portable 1 View Result Date: 06/07/2024 EXAM: 1 VIEW(S) XRAY OF THE CHEST 06/07/2024 09:15:54 PM COMPARISON: Comparison is made to chest x-ray dated 10/07/2021 and chest CT dated 02/18/2023. CLINICAL HISTORY: shortness of breath. Pt to ED from home for SOB. HC of CHF. FINDINGS: LUNGS AND PLEURA: Airspace disease in the right upper lobe persists. Fibrotic changes in the lung bases are unchanged. No new focal lung infiltrate. No pulmonary  edema. No pleural effusion. No pneumothorax. HEART AND MEDIASTINUM: No acute abnormality of the cardiac and mediastinal silhouettes. BONES AND SOFT TISSUES: No acute osseous abnormality. IMPRESSION: 1. No acute findings. 2. Persistent airspace disease in the right upper lobe and unchanged fibrotic changes in the lung bases. Electronically signed by: Greig Pique MD 06/07/2024 09:20 PM EDT RP Workstation: [id number]      IMPRESSION AND PLAN:  Assessment and Plan: * Sepsis due to pneumonia [facility]) - The patient will be admitted to a medical telemetry bed. - Will continue antibiotic therapy with IV Rocephin and Zithromax. - Mucolytic therapy be provided as well as duo nebs q.i.d. and q.4 hours p.r.n. - We will follow blood cultures. -The patient will be hydrated with IV lactated ringer.  Acute on chronic respiratory failure with hypoxia (HCC) - This is clearly secondary to #1. - O2 protocol will be followed. - Management otherwise as above.  Benign essential hypertension - Will continue antihypertensive therapy.  Dyslipidemia - Will continue statin therapy.  History of breast cancer - Continue letrozole .  Controlled type 2 diabetes mellitus without complication, without long-term current use of insulin  (HCC) - The patient will be placed on supplement coverage with NovoLog . - Will hold off metformin.   DVT prophylaxis: Lovenox .  Advanced Care Planning:  Code Status: full code.  Family Communication:  The plan of care was discussed in details with the patient (and family). I answered all questions. The patient agreed to proceed with the above mentioned plan. Further management will depend upon hospital course. Disposition Plan: Back to previous home environment Consults called: none.  All the records are reviewed and case discussed with ED provider.  Status is: Inpatient  At the time of the admission, it appears that the appropriate admission status for this patient is inpatient.   This is judged to be reasonable and necessary in order to provide the required intensity of service to ensure the patient's safety given the presenting symptoms, physical exam findings and initial radiographic and laboratory data in the context of comorbid conditions.  The patient requires inpatient status due to high intensity of service, high risk of further deterioration and high frequency of surveillance required.  I certify that at the time of admission, it is my clinical judgment that the patient will require inpatient hospital care extending more than 2 midnights.                            Dispo: The patient is from: Home              Anticipated d/c is to: Home              Patient currently is not medically stable to d/c.  Difficult to place patient: No  [place] DELENA Peaches M.D on 06/08/2024 at 4:22 AM  [facility]   From 7 PM-7 AM, contact night-coverage [email or web address]  CC: Primary care physician; Sherial Bail, MD

## 2024-06-07 NOTE — Progress Notes (Signed)
 CODE SEPSIS - PHARMACY COMMUNICATION  **Broad Spectrum Antibiotics should be administered within 1 hour of Sepsis diagnosis**  Time Code Sepsis Called/Page Received: 2334  Antibiotics Ordered: Cefepime & Azithromycin  Time of 1st antibiotic administration: 2350  [name], PharmD, [facility] 06/07/2024 11:35 PM

## 2024-06-07 NOTE — Sepsis Progress Note (Signed)
 Elink monitoring for the code sepsis protocol.

## 2024-06-08 DIAGNOSIS — Z853 Personal history of malignant neoplasm of breast: Secondary | ICD-10-CM

## 2024-06-08 DIAGNOSIS — E785 Hyperlipidemia, unspecified: Secondary | ICD-10-CM

## 2024-06-08 DIAGNOSIS — J189 Pneumonia, unspecified organism: Secondary | ICD-10-CM | POA: Diagnosis not present

## 2024-06-08 DIAGNOSIS — J9621 Acute and chronic respiratory failure with hypoxia: Secondary | ICD-10-CM

## 2024-06-08 DIAGNOSIS — E119 Type 2 diabetes mellitus without complications: Secondary | ICD-10-CM

## 2024-06-08 DIAGNOSIS — A419 Sepsis, unspecified organism: Secondary | ICD-10-CM | POA: Diagnosis not present

## 2024-06-08 LAB — BASIC METABOLIC PANEL WITH GFR
Anion gap: 9 (ref 5–15)
BUN: 10 mg/dL (ref 8–23)
CO2: 27 mmol/L (ref 22–32)
Calcium: 8.9 mg/dL (ref 8.9–10.3)
Chloride: 102 mmol/L (ref 98–111)
Creatinine, Ser: 0.62 mg/dL (ref 0.44–1.00)
GFR, Estimated: >60 mL/min (ref >60)
Glucose, Bld: 112 mg/dL — ABNORMAL HIGH (ref 70–99)
Potassium: 4.1 mmol/L (ref 3.5–5.1)
Sodium: 138 mmol/L (ref 135–145)

## 2024-06-08 LAB — CBC
HCT: 41.4 % (ref 36.0–46.0)
Hemoglobin: 13.5 g/dL (ref 12.0–15.0)
MCH: 26.7 pg (ref 26.0–34.0)
MCHC: 32.6 g/dL (ref 30.0–36.0)
MCV: 82 fL (ref 80.0–100.0)
Platelets: 238 K/uL (ref 150–400)
RBC: 5.05 MIL/uL (ref 3.87–5.11)
RDW: 15.5 % (ref 11.5–15.5)
WBC: 10.8 K/uL — ABNORMAL HIGH (ref 4.0–10.5)
nRBC: 0 % (ref 0.0–0.2)

## 2024-06-08 LAB — LACTIC ACID, PLASMA
Lactic Acid, Venous: 1.1 mmol/L (ref 0.5–1.9)
Lactic Acid, Venous: 1.6 mmol/L (ref 0.5–1.9)
Lactic Acid, Venous: 2.9 mmol/L (ref 0.5–1.9)

## 2024-06-08 LAB — PROTIME-INR
INR: 1.1 (ref 0.8–1.2)
Prothrombin Time: 14.8 s (ref 11.4–15.2)

## 2024-06-08 LAB — HIV ANTIBODY (ROUTINE TESTING W REFLEX): HIV Screen 4th Generation wRfx: NONREACTIVE

## 2024-06-08 LAB — CORTISOL-AM, BLOOD: Cortisol - AM: 16.2 ug/dL (ref 6.7–22.6)

## 2024-06-08 MED ORDER — ACETAMINOPHEN 325 MG PO TABS
650.0000 mg | ORAL_TABLET | Freq: Four times a day (QID) | ORAL | Status: DC | PRN
  Administered 2024-06-08 – 2024-06-11 (×5): 650 mg via ORAL
  Filled 2024-06-08 (×5): qty 2

## 2024-06-08 MED ORDER — LOSARTAN POTASSIUM 50 MG PO TABS
50.0000 mg | ORAL_TABLET | Freq: Every day | ORAL | Status: DC
  Administered 2024-06-08 – 2024-06-11 (×4): 50 mg via ORAL
  Filled 2024-06-08 (×4): qty 1

## 2024-06-08 MED ORDER — MAGNESIUM HYDROXIDE 400 MG/5ML PO SUSP: 30.0000 mL | Freq: Every day | ORAL | Status: DC | PRN

## 2024-06-08 MED ORDER — LACTATED RINGERS IV SOLN
150.0000 mL/h | INTRAVENOUS | Status: DC
  Administered 2024-06-08 (×2): 150 mL/h via INTRAVENOUS

## 2024-06-08 MED ORDER — FUROSEMIDE 40 MG PO TABS
40.0000 mg | ORAL_TABLET | Freq: Every day | ORAL | Status: DC
  Administered 2024-06-08: 40 mg via ORAL
  Filled 2024-06-08: qty 1

## 2024-06-08 MED ORDER — ACETAMINOPHEN 650 MG RE SUPP: 650.0000 mg | Freq: Four times a day (QID) | RECTAL | Status: DC | PRN

## 2024-06-08 MED ORDER — ONDANSETRON HCL 4 MG/2ML IJ SOLN: 4.0000 mg | Freq: Four times a day (QID) | INTRAMUSCULAR | Status: DC | PRN

## 2024-06-08 MED ORDER — SODIUM CHLORIDE 0.9 % IV SOLN
500.0000 mg | INTRAVENOUS | Status: DC
  Administered 2024-06-08: 500 mg via INTRAVENOUS
  Filled 2024-06-08: qty 5

## 2024-06-08 MED ORDER — LETROZOLE 2.5 MG PO TABS
2.5000 mg | ORAL_TABLET | Freq: Every day | ORAL | Status: DC
  Administered 2024-06-08 – 2024-06-11 (×4): 2.5 mg via ORAL
  Filled 2024-06-08 (×4): qty 1

## 2024-06-08 MED ORDER — PREDNISONE 2.5 MG PO TABS
2.5000 mg | ORAL_TABLET | Freq: Every day | ORAL | Status: DC
  Administered 2024-06-08 – 2024-06-11 (×4): 2.5 mg via ORAL
  Filled 2024-06-08 (×4): qty 1

## 2024-06-08 MED ORDER — DILTIAZEM HCL 30 MG PO TABS
60.0000 mg | ORAL_TABLET | Freq: Two times a day (BID) | ORAL | Status: DC
  Administered 2024-06-08 – 2024-06-11 (×4): 60 mg via ORAL
  Filled 2024-06-08 (×6): qty 2

## 2024-06-08 MED ORDER — TRAZODONE HCL 50 MG PO TABS: 25.0000 mg | ORAL_TABLET | Freq: Every evening | ORAL | Status: DC | PRN

## 2024-06-08 MED ORDER — FUROSEMIDE 10 MG/ML IJ SOLN
40.0000 mg | Freq: Once | INTRAMUSCULAR | Status: AC
  Administered 2024-06-08: 40 mg via INTRAVENOUS
  Filled 2024-06-08: qty 4

## 2024-06-08 MED ORDER — ENOXAPARIN SODIUM 40 MG/0.4ML IJ SOSY
40.0000 mg | PREFILLED_SYRINGE | INTRAMUSCULAR | Status: DC
  Administered 2024-06-08 – 2024-06-11 (×4): 40 mg via SUBCUTANEOUS
  Filled 2024-06-08 (×4): qty 0.4

## 2024-06-08 MED ORDER — TRAMADOL HCL 50 MG PO TABS
50.0000 mg | ORAL_TABLET | Freq: Three times a day (TID) | ORAL | Status: DC | PRN
  Administered 2024-06-08 (×2): 50 mg via ORAL
  Filled 2024-06-08 (×2): qty 1

## 2024-06-08 MED ORDER — ONDANSETRON HCL 4 MG PO TABS: 4.0000 mg | ORAL_TABLET | Freq: Four times a day (QID) | ORAL | Status: DC | PRN

## 2024-06-08 MED ORDER — DILTIAZEM HCL 30 MG PO TABS
120.0000 mg | ORAL_TABLET | Freq: Two times a day (BID) | ORAL | Status: DC
  Administered 2024-06-08: 120 mg via ORAL
  Filled 2024-06-08: qty 4

## 2024-06-08 MED ORDER — GUAIFENESIN ER 600 MG PO TB12
600.0000 mg | ORAL_TABLET | Freq: Two times a day (BID) | ORAL | Status: DC
  Administered 2024-06-08 – 2024-06-11 (×7): 600 mg via ORAL
  Filled 2024-06-08 (×8): qty 1

## 2024-06-08 MED ORDER — ATORVASTATIN CALCIUM 20 MG PO TABS
40.0000 mg | ORAL_TABLET | Freq: Every day | ORAL | Status: DC
  Administered 2024-06-08 – 2024-06-11 (×4): 40 mg via ORAL
  Filled 2024-06-08 (×4): qty 2

## 2024-06-08 MED ORDER — HYDROCOD POLI-CHLORPHE POLI ER 10-8 MG/5ML PO SUER: 5.0000 mL | Freq: Two times a day (BID) | ORAL | Status: DC | PRN

## 2024-06-08 MED ORDER — POTASSIUM CHLORIDE CRYS ER 20 MEQ PO TBCR
20.0000 meq | EXTENDED_RELEASE_TABLET | Freq: Every day | ORAL | Status: DC
  Administered 2024-06-08 – 2024-06-11 (×4): 20 meq via ORAL
  Filled 2024-06-08 (×4): qty 1

## 2024-06-08 MED ORDER — BUDESONIDE-FORMOTEROL FUMARATE 160-4.5 MCG/ACT IN AERO: 2.0000 | INHALATION_SPRAY | Freq: Two times a day (BID) | RESPIRATORY_TRACT | Status: DC

## 2024-06-08 MED ORDER — SODIUM CHLORIDE 0.9 % IV SOLN
2.0000 g | INTRAVENOUS | Status: DC
  Administered 2024-06-08 – 2024-06-11 (×4): 2 g via INTRAVENOUS
  Filled 2024-06-08 (×4): qty 20

## 2024-06-08 MED ORDER — FLUTICASONE FUROATE-VILANTEROL 200-25 MCG/ACT IN AEPB
1.0000 | INHALATION_SPRAY | Freq: Every day | RESPIRATORY_TRACT | Status: DC
  Administered 2024-06-08 – 2024-06-11 (×4): 1 via RESPIRATORY_TRACT
  Filled 2024-06-08: qty 28

## 2024-06-08 MED ORDER — VITAMIN D 25 MCG (1000 UNIT) PO TABS
2000.0000 [IU] | ORAL_TABLET | Freq: Every day | ORAL | Status: DC
  Administered 2024-06-08 – 2024-06-11 (×4): 2000 [IU] via ORAL
  Filled 2024-06-08 (×4): qty 2

## 2024-06-08 MED ORDER — ASPIRIN 81 MG PO TBEC
81.0000 mg | DELAYED_RELEASE_TABLET | Freq: Every day | ORAL | Status: DC
  Administered 2024-06-08 – 2024-06-11 (×4): 81 mg via ORAL
  Filled 2024-06-08 (×4): qty 1

## 2024-06-08 NOTE — Assessment & Plan Note (Signed)
-   The patient will be placed on supplement coverage with NovoLog . - Will hold off metformin.

## 2024-06-08 NOTE — Hospital Course (Addendum)
 Hospital course / significant events:   Erika Schmidt is a 68 y.o. 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  The patient denied any fever or chills. EMS applied CPAP. Chronic 3L O2 at home   10/29: to ED. On BiPAP. CTA of the chest revealed no evidence for PE or aortic dissection. Showed chronic fibrotic lung disease and increased ground glass disease in both lungs most evident in the right upper lobe compared to prior. It was difficult to exclude superimposed postacute and infection versus progression of chronic interstitial lung disease. Started on cefepime. Admitted to hospitalist.  10/30: pt notes she had not taken her lasix  a few days before SOB developed. Question HFpEF but normal BNP and clinically improved w/o much diuresis, will repeat Echo 10/31: echo EF 50-55, Grade 1 diast df. Good UOP through yesterday 1000 mL ind aytime. Pt was hoping for discharge but significant desat on ambulation and required wheelchair back to room but recovered w/ rest. Gave another dose IV lasix .  11/01: 1800 mL UOP overnight. Pt better ambulation today but still SOB, will keep another night and reevaluate tm after further diuresis.      Consultants:  none  Procedures/Surgeries: none      ASSESSMENT & PLAN:   Sepsis vs SIRS  Acute on chronic respiratory failure with hypoxia  due to pneumonia d/t ILD-related decompensation or HFpEF Suspect current hypoxic exacerbation is multifactorial:  HFpEF exacerbation given recently not taking lasix  and improvement w/ diuresis. 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 Slight increased ground-glass disease within the bilateral lungs, most evident in the right upper lobe compared to prior, difficult to exclude superimposed post acute infection versus progression of chronic interstitial lung disease. O2 to supplement Continue home inhaler (symbicort to substitute here) Management of underlying cause(s) Steroids continue home dose, will not escalate at this time on hospital day 2 w/ relative improvement treating as otherwise noted  IV Rocephin and Zithromax. Mucolytic  duo nebs q.i.d. and q.4 hours p.r.n.   Dyspnea, orthopnea, rales - clinically CHF w/ echo last on file 02/2023 preserved EF and no noted diastolic dysfunction Normal BNP suggests against CHF exacerbation but clinically there is correlation w/ missing lasix  at home and worsening respiratory status, rales on exam  Net IO Since Admission: -3,282.47 mL [06/10/24 1411] Strict I&O Lasix  40 mg bid Echo repeat (last on file 02/2023 preserved EF, no diastolic df noted, no valvular df noted) EF now at 50-55 and Grade 1 diast df noted   Benign essential hypertension continue antihypertensive therapy reduced cardizem d/t lower BP this morning    Dyslipidemia statin   History of breast cancer letrozole .   Controlled type 2 diabetes mellitus without complication, without long-term current use of insulin  Hold metformin Monitor Glc, initiate SSI /insulin  as needed     Class 1 obesity based on BMI: Body mass  index is 31.52 kg/m.Erika Schmidt Significantly low or high BMI is associated with higher medical risk.  Underweight -  under 18  overweight - 25 to 29 obese - 30 or more Class 1 obesity: BMI of 30.0 to 34 Class 2 obesity: BMI of 35.0 to 39 Class 3 obesity: BMI of 40.0 to 49 Super Morbid Obesity: BMI 50-59 Super-super Morbid Obesity: BMI 60+ Healthy nutrition and physical activity advised as adjunct to other disease management and risk reduction treatments    DVT prophylaxis: lovenox  IV fluids: dc continuous IV fluids  Nutrition: cardiac/carb Central lines / other devices: none  Code Status: FULL CODE ACP documentation reviewed: none on file in [facility]  TOC needs: TBD Medical barriers to dispo: increased O2 requirement, diuresing. Expected medical readiness for discharge pending O2 to baseline or close to that, hoping tomorrow as she desateed on ambulation today

## 2024-06-08 NOTE — Assessment & Plan Note (Signed)
-   This is clearly secondary to #1. - O2 protocol will be followed. - Management otherwise as above. 

## 2024-06-08 NOTE — Progress Notes (Signed)
 PROGRESS NOTE    Erika Schmidt   FMW:[id number] DOB: Dec 13, 1955  DOA: 06/07/2024 Date of Service: 06/08/24 which is hospital day 1  PCP: Sherial Bail, MD    Hospital course / significant events:   Erika Schmidt is a 68 y.o. 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  The patient denied any fever or chills. EMS applied CPAP. Chronic 3L O2 at home   10/29: to ED. On BiPAP. CTA of the chest revealed no evidence for PE or aortic dissection. Showed chronic fibrotic lung disease and increased ground glass disease in both lungs most evident in the right upper lobe compared to prior. It was difficult to exclude superimposed postacute and infection versus progression of chronic interstitial lung disease. Started on cefepime. Admitted to hospitalist.  10/30: pt notes she had not taken her lasix  a few days before SOB developed. Question HFpEF but normal BNP and clinically improved w/o much diuresis, will repeat Echo     Consultants:  none  Procedures/Surgeries: none      ASSESSMENT & PLAN:   Sepsis vs SIRS  Acute on chronic respiratory failure with hypoxia  due to pneumonia d/t ILD-related decompensation or CHF   Suspect current hypoxic exacerbation is multifactorial: question HFpEF exacerbation given recently not taking lasix  however BNP was wnl and she improved without significant diuresis though rales are present on exam. 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 Slight increased ground-glass disease within the bilateral lungs, most evident in the right upper lobe compared to prior, difficult to exclude superimposed post acute infection versus progression of chronic interstitial lung disease. O2 to supplement Continue home inhaler (symbicort to substitute here) Management of underlying cause(s) Steroids continue home dose, will not escalate at this time on hospital day 2 w/ relative improvement treating as otherwise noted  IV Rocephin and Zithromax. Mucolytic  duo nebs q.i.d. and q.4 hours p.r.n. follow blood cultures. Dc iv fluids     Dyspnea, orthopnea, rales - clinically CHF w/ echo last on file 02/2023 preserved EF and no noted diastolic dysfunction Normal BNP suggests against CHF exacerbation but clinically there is correlation w/ missing lasix  at home and worsening respiratory status, rales on exam  Strict I&O Lasix  40 mg IV x1 and monitor output  Echo repeat (last on file 02/2023 preserved EF, no diastolic df noted, no valvular df noted)   Benign essential hypertension continue antihypertensive therapy reduced cardizem d/t lower BP this morning    Dyslipidemia statin   History of breast cancer letrozole .   Controlled type 2 diabetes mellitus without complication, without long-term current use of insulin  Hold metformin Monitor Glc, initiate SSI /insulin  as needed     Class 1 obesity based on BMI: Body mass index is 31.52 kg/m.Erika Schmidt Significantly low or high BMI is associated with higher medical risk.  Underweight - under 18  overweight - 25 to 29 obese - 30 or more Class 1 obesity: BMI of 30.0 to 34 Class 2 obesity: BMI of 35.0 to 39 Class 3 obesity: BMI of  40.0 to 49 Super Morbid Obesity: BMI 50-59 Super-super Morbid Obesity: BMI 60+ Healthy nutrition and physical activity advised as adjunct to other disease management and risk reduction treatments    DVT prophylaxis: lovenox  IV fluids: dc continuous IV fluids  Nutrition: cardiac/carb Central lines / other devices: none  Code Status: FULL CODE ACP documentation reviewed: none on file in [facility]  TOC needs: TBD Medical barriers to dispo: increased O2 requirement, await Echo. Expected medical readiness for discharge pending O2 to baseline or close to that, ambulation tolerance, possible PT/OT eval.              Subjective / Brief ROS:  Patient reports breathing better today Denies CP/SOB at rest  Pain controlled.  Denies new weakness.  Tolerating diet.  Reports no concerns w/ urination/defecation. Reports urinating a lot now that her lasix  has been resumed   Family Communication: none at thist ime    Objective Findings:  Vitals:   06/08/24 0030 06/08/24 0359 06/08/24 0725 06/08/24 1104  BP:  (!) 114/59 (!) 99/55 135/61  Pulse:  85 80 94  Resp:  18  16  Temp:  98.5 F (36.9 C) 98.5 F (36.9 C) 98 F (36.7 C)  TempSrc:   Oral Oral  SpO2:  98% 95% 96%  Weight: 80.7 kg     Height: 5' 3 (1.6 m)       Intake/Output Summary (Last 24 hours) at 06/08/2024 1346 Last data filed at 06/08/2024 1012 Gross per 24 hour  Intake 1007.53 ml  Output 1100 ml  Net -92.47 ml   Filed Weights   06/08/24 0030  Weight: 80.7 kg    Examination:  Physical Exam Constitutional:      General: She is not in acute distress. Cardiovascular:     Rate and Rhythm: Normal rate and regular rhythm.     Heart sounds: No murmur heard. Pulmonary:     Breath sounds: Examination of the right-lower field reveals rales. Examination of the left-lower field reveals rales. Rales present. No decreased breath sounds or wheezing.  Musculoskeletal:     Right lower leg: No edema.     Left lower  leg: No edema.  Neurological:     General: No focal deficit present.     Mental Status: She is alert and oriented to person, place, and time.  Psychiatric:        Mood and Affect: Mood normal.        Behavior: Behavior normal.          Scheduled Medications:   aspirin  EC  81 mg Oral Daily   atorvastatin   40 mg Oral Daily   budesonide-formoterol  2 puff Inhalation BID   cholecalciferol  2,000 Units Oral Daily   diltiazem  60 mg Oral Q12H   enoxaparin  (LOVENOX ) injection  40 mg Subcutaneous Q24H   furosemide   40 mg Intravenous Once   guaiFENesin   600 mg Oral BID   letrozole   2.5 mg Oral Daily   losartan  50 mg Oral Daily   potassium chloride  20 mEq Oral Daily   predniSONE   2.5 mg Oral Q breakfast    Continuous Infusions:  azithromycin     cefTRIAXone (ROCEPHIN)  IV 2 g (06/08/24 1003)    PRN Medications:  acetaminophen  **OR** acetaminophen , chlorpheniramine-HYDROcodone, magnesium hydroxide, ondansetron  **  OR** ondansetron  (ZOFRAN ) IV, traMADol , traZODone  Antimicrobials from admission:  Anti-infectives (From admission, onward)    Start     Dose/Rate Route Frequency Ordered Stop   06/08/24 2200  azithromycin (ZITHROMAX) 500 mg in sodium chloride  0.9 % 250 mL IVPB        500 mg 250 mL/hr over 60 Minutes Intravenous Every 24 hours 06/08/24 0142 06/12/24 2159   06/08/24 0800  cefTRIAXone (ROCEPHIN) 2 g in sodium chloride  0.9 % 100 mL IVPB        2 g 200 mL/hr over 30 Minutes Intravenous Every 24 hours 06/08/24 0142 06/13/24 0759   06/07/24 2345  ceFEPIme (MAXIPIME) 2 g in sodium chloride  0.9 % 100 mL IVPB        2 g 200 mL/hr over 30 Minutes Intravenous  Once 06/07/24 2333 06/08/24 0050   06/07/24 2345  azithromycin (ZITHROMAX) 500 mg in sodium chloride  0.9 % 250 mL IVPB  Status:  Discontinued        500 mg 250 mL/hr over 60 Minutes Intravenous Every 24 hours 06/07/24 2333 06/08/24 0145           Data Reviewed:  I have personally reviewed the  following...  CBC: Recent Labs  Lab 06/07/24 2200 06/08/24 0517  WBC 12.7* 10.8*  NEUTROABS 8.8*  --   HGB 15.3* 13.5  HCT 49.0* 41.4  MCV 84.8 82.0  PLT PLATELET CLUMPS NOTED ON SMEAR, UNABLE TO ESTIMATE 238   Basic Metabolic Panel: Recent Labs  Lab 06/07/24 2200 06/08/24 0517  NA 139 138  K 4.1 4.1  CL 102 102  CO2 24 27  GLUCOSE 93 112*  BUN 10 10  CREATININE 0.59 0.62  CALCIUM  9.6 8.9   GFR: Estimated Creatinine Clearance: 67.7 mL/min (by C-G formula based on SCr of 0.62 mg/dL). Liver Function Tests: Recent Labs  Lab 06/07/24 2200  AST 69*  ALT 32  ALKPHOS 107  BILITOT 2.0*  PROT 8.5*  ALBUMIN 3.8   No results for input(s): LIPASE, AMYLASE in the last 168 hours. No results for input(s): AMMONIA in the last 168 hours. Coagulation Profile: Recent Labs  Lab 06/08/24 0517  INR 1.1   Cardiac Enzymes: No results for input(s): CKTOTAL, CKMB, CKMBINDEX, TROPONINI in the last 168 hours. BNP (last 3 results) No results for input(s): PROBNP in the last 8760 hours. HbA1C: No results for input(s): HGBA1C in the last 72 hours. CBG: No results for input(s): GLUCAP in the last 168 hours. Lipid Profile: No results for input(s): CHOL, HDL, LDLCALC, TRIG, CHOLHDL, LDLDIRECT in the last 72 hours. Thyroid Function Tests: No results for input(s): TSH, T4TOTAL, FREET4, T3FREE, THYROIDAB in the last 72 hours. Anemia Panel: No results for input(s): VITAMINB12, FOLATE, FERRITIN, TIBC, IRON, RETICCTPCT in the last 72 hours. Most Recent Urinalysis On File:     Component Value Date/Time   COLORURINE STRAW (A) 06/07/2024 2315   APPEARANCEUR CLEAR (A) 06/07/2024 2315   LABSPEC 1.006 06/07/2024 2315   PHURINE 6.0 06/07/2024 2315   GLUCOSEU NEGATIVE 06/07/2024 2315   HGBUR NEGATIVE 06/07/2024 2315   BILIRUBINUR NEGATIVE 06/07/2024 2315   KETONESUR NEGATIVE 06/07/2024 2315   PROTEINUR NEGATIVE 06/07/2024 2315   NITRITE  NEGATIVE 06/07/2024 2315   LEUKOCYTESUR NEGATIVE 06/07/2024 2315   Sepsis Labs: @LABRCNTIP (procalcitonin:4,lacticidven:4) Microbiology: Recent Results (from the past 240 hours)  Blood Culture (routine x 2)     Status: None (Preliminary result)   Collection Time: 06/07/24  8:35 PM   Specimen: BLOOD  Result Value  Ref Range Status   Specimen Description BLOOD LEFT HAND  Final   Special Requests   Final    BOTTLES DRAWN AEROBIC AND ANAEROBIC Blood Culture results may not be optimal due to an inadequate volume of blood received in culture bottles   Culture   Final    NO GROWTH < 12 HOURS Performed at [facility], [address], [place], [place] [address]    Report Status PENDING  Incomplete  Resp panel by RT-PCR (RSV, Flu A&B, Covid) Anterior Nasal Swab     Status: None   Collection Time: 06/07/24  9:05 PM   Specimen: Anterior Nasal Swab  Result Value Ref Range Status   SARS Coronavirus 2 by RT PCR NEGATIVE NEGATIVE Final    Comment: (NOTE) SARS-CoV-2 target nucleic acids are NOT DETECTED.  The SARS-CoV-2 RNA is generally detectable in upper respiratory specimens during the acute phase of infection. The lowest concentration of SARS-CoV-2 viral copies this assay can detect is 138 copies/mL. A negative result does not preclude SARS-Cov-2 infection and should not be used as the sole basis for treatment or other patient management decisions. A negative result may occur with  improper specimen collection/handling, submission of specimen other than nasopharyngeal swab, presence of viral mutation(s) within the areas targeted by this assay, and inadequate number of viral copies(<138 copies/mL). A negative result must be combined with clinical observations, patient history, and epidemiological information. The expected result is Negative.  Fact Sheet for Patients:  bloggercourse.com  Fact Sheet for Healthcare Providers:   seriousbroker.it  This test is no t yet approved or cleared by the [place]  FDA and  has been authorized for detection and/or diagnosis of SARS-CoV-2 by FDA under an Emergency Use Authorization (EUA). This EUA will remain  in effect (meaning this test can be used) for the duration of the COVID-19 declaration under Section 564(b)(1) of the Act, 21 U.S.C.section 360bbb-3(b)(1), unless the authorization is terminated  or revoked sooner.       Influenza A by PCR NEGATIVE NEGATIVE Final   Influenza B by PCR NEGATIVE NEGATIVE Final    Comment: (NOTE) The Xpert Xpress SARS-CoV-2/FLU/RSV plus assay is intended as an aid in the diagnosis of influenza from Nasopharyngeal swab specimens and should not be used as a sole basis for treatment. Nasal washings and aspirates are unacceptable for Xpert Xpress SARS-CoV-2/FLU/RSV testing.  Fact Sheet for Patients: bloggercourse.com  Fact Sheet for Healthcare Providers: seriousbroker.it  This test is not yet approved or cleared by the [place]  FDA and has been authorized for detection and/or diagnosis of SARS-CoV-2 by FDA under an Emergency Use Authorization (EUA). This EUA will remain in effect (meaning this test can be used) for the duration of the COVID-19 declaration under Section 564(b)(1) of the Act, 21 U.S.C. section 360bbb-3(b)(1), unless the authorization is terminated or revoked.     Resp Syncytial Virus by PCR NEGATIVE NEGATIVE Final    Comment: (NOTE) Fact Sheet for Patients: bloggercourse.com  Fact Sheet for Healthcare Providers: seriousbroker.it  This test is not yet approved or cleared by the [place]  FDA and has been authorized for detection and/or diagnosis of SARS-CoV-2 by FDA under an Emergency Use Authorization (EUA). This EUA will remain in effect (meaning this test can be used) for  the duration of the COVID-19 declaration under Section 564(b)(1) of the Act, 21 U.S.C. section 360bbb-3(b)(1), unless the authorization is terminated or revoked.  Performed at [facility], [address], [place], [place] [address]  Blood Culture (routine x 2)     Status: None (Preliminary result)   Collection Time: 06/07/24 10:45 PM   Specimen: BLOOD  Result Value Ref Range Status   Specimen Description BLOOD RIGHT ASSIST CONTROL  Final   Special Requests   Final    BOTTLES DRAWN AEROBIC AND ANAEROBIC Blood Culture adequate volume   Culture   Final    NO GROWTH < 12 HOURS Performed at [facility], [address] [place], [place] [address]    Report Status PENDING  Incomplete      Radiology Studies last 3 days: CT Angio Chest PE W and/or Wo Contrast Result Date: 06/07/2024 CLINICAL DATA:  Shortness of breath EXAM: CT ANGIOGRAPHY CHEST WITH CONTRAST TECHNIQUE: Multidetector CT imaging of the chest was performed using the standard protocol during bolus administration of intravenous contrast. Multiplanar CT image reconstructions and MIPs were obtained to evaluate the vascular anatomy. RADIATION DOSE REDUCTION: This exam was performed according to the departmental dose-optimization program which includes automated exposure control, adjustment of the mA and/or kV according to patient size and/or use of iterative reconstruction technique. CONTRAST:  75mL OMNIPAQUE IOHEXOL 350 MG/ML SOLN COMPARISON:  Chest x-ray 06/07/2024, chest CT 02/18/2023, 04/03/2022 FINDINGS: Cardiovascular: Satisfactory opacification of the pulmonary arteries to the segmental level. No evidence of pulmonary embolism. Nonaneurysmal aorta. No dissection. Mild atherosclerosis. Borderline cardiomegaly. No significant pericardial effusion Mediastinum/Nodes: Patent trachea. No thyroid mass. Enlarged right paratracheal node measuring 18 mm. Left anterior hilar node measuring 12 mm. Small right hilar nodes  measuring up to 14 mm. Esophagus is within normal limits. Lungs/Pleura: Chronic fibrotic lung disease and areas of ground-glass density. Mild bronchiectasis in the lower lobes. Increased ground-glass disease within the bilateral lungs, most evident in the right upper lobe compared to prior. Negative for pleural effusion or pneumothorax. Upper Abdomen: Gallstones Musculoskeletal: Irregular collection in left breast with some fat density within likely due to lumpectomy change. No acute osseous abnormality. Review of the MIP images confirms the above findings. IMPRESSION: 1. Negative for acute pulmonary embolus or aortic dissection. 2. Chronic fibrotic lung disease. Slight increased ground-glass disease within the bilateral lungs, most evident in the right upper lobe compared to prior, difficult to exclude superimposed post acute infection versus progression of chronic interstitial lung disease. 3. Mild mediastinal and hilar adenopathy, likely reactive, but attention on follow-up imaging. 4. Gallstones. 5. Aortic atherosclerosis. Aortic Atherosclerosis (ICD10-I70.0). Electronically Signed   By: Luke Bun M.D.   On: 06/07/2024 23:13   DG Chest Portable 1 View Result Date: 06/07/2024 EXAM: 1 VIEW(S) XRAY OF THE CHEST 06/07/2024 09:15:54 PM COMPARISON: Comparison is made to chest x-ray dated 10/07/2021 and chest CT dated 02/18/2023. CLINICAL HISTORY: shortness of breath. Pt to ED from home for SOB. HC of CHF. FINDINGS: LUNGS AND PLEURA: Airspace disease in the right upper lobe persists. Fibrotic changes in the lung bases are unchanged. No new focal lung infiltrate. No pulmonary edema. No pleural effusion. No pneumothorax. HEART AND MEDIASTINUM: No acute abnormality of the cardiac and mediastinal silhouettes. BONES AND SOFT TISSUES: No acute osseous abnormality. IMPRESSION: 1. No acute findings. 2. Persistent airspace disease in the right upper lobe and unchanged fibrotic changes in the lung bases. Electronically  signed by: Greig Pique MD 06/07/2024 09:20 PM EDT RP Workstation: [id number]       Time spent: 50 min     Laneta Blunt, DO [facility] 06/08/2024, 1:46 PM    Dictation software may have been used to generate the above note.  Typos may occur and escape review in typed/dictated notes. Please contact Dr Marsa directly for clarity if needed.  Staff may message me via secure chat in Epic  but this may not receive an immediate response,  please page me for urgent matters!  If 7PM-7AM, please contact night coverage [email or web address]

## 2024-06-08 NOTE — Plan of Care (Signed)

## 2024-06-08 NOTE — Assessment & Plan Note (Signed)
 Will continue statin therapy

## 2024-06-08 NOTE — Assessment & Plan Note (Signed)
-   Will continue antihypertensive therapy.

## 2024-06-08 NOTE — TOC Initial Note (Signed)
 Transition of Care [facility]) - Initial/Assessment Note    Patient Details  Name: Erika Schmidt MRN: [id number] Date of Birth: 1956-01-07  Transition of Care [facility]) CM/SW Contact:    Lauraine JAYSON Carpen, LCSW Phone Number: 06/08/2024, 2:05 PM  Clinical Narrative:  CSW met with patient. No family at bedside. CSW introduced role and explained that discharge planning would be discussed. PCP is Dr. Sherial. She drives herself to appointments. [facility] [address]. No issues affording medications. Patient lives home with her son. No home health or DME use prior to admission other than 3 L of oxygen through [employer]. No further concerns. CSW will continue to follow patient for support and facilitate return home once stable. Her husband or son will transport her home at discharge and she will ask them to bring her oxygen for the ride.                 Expected Discharge Plan: Home/Self Care Barriers to Discharge: Continued Medical Work up   Patient Goals and CMS Choice            Expected Discharge Plan and Services     Post Acute Care Choice: NA Living arrangements for the past 2 months: Apartment                                      Prior Living Arrangements/Services Living arrangements for the past 2 months: Apartment Lives with:: Adult Children Patient language and need for interpreter reviewed:: Yes Do you feel safe going back to the place where you live?: Yes      Need for Family Participation in Patient Care: Yes (Comment) Care giver support system in place?: Yes (comment) Current home services: DME Criminal Activity/Legal Involvement Pertinent to Current Situation/Hospitalization: No - Comment as needed  Activities of Daily Living   ADL Screening (condition at time of admission) Independently performs ADLs?: Yes (appropriate for developmental age) Is the patient deaf or have difficulty hearing?: No Does the patient have difficulty seeing, even when  wearing glasses/contacts?: No Does the patient have difficulty concentrating, remembering, or making decisions?: No  Permission Sought/Granted                  Emotional Assessment Appearance:: Appears stated age Attitude/Demeanor/Rapport: Engaged, Gracious Affect (typically observed): Accepting, Appropriate, Calm, Pleasant Orientation: : Oriented to Self, Oriented to Place, Oriented to  Time, Oriented to Situation Alcohol / Substance Use: Not Applicable Psych Involvement: No (comment)  Admission diagnosis:  Interstitial lung disease (HCC) [J84.9] Sepsis due to pneumonia (HCC) [J18.9, A41.9] Multifocal pneumonia [J18.8] Sepsis with acute hypoxic respiratory failure without septic shock, due to unspecified organism (HCC) [A41.9, R65.20, J96.01] Patient Active Problem List   Diagnosis Date Noted   Dyslipidemia 06/08/2024   History of breast cancer 06/08/2024   Controlled type 2 diabetes mellitus without complication, without long-term current use of insulin  (HCC) 06/08/2024   Acute on chronic respiratory failure with hypoxia (HCC) 06/08/2024   Sepsis due to pneumonia (HCC) 06/07/2024   Malignant neoplasm of upper-outer quadrant of left breast in female, estrogen receptor positive (HCC) 01/10/2024   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 [place] [address] Phone: [phone] Fax: [phone]     Social Drivers of Health (SDOH) Social History: SDOH Screenings   Food Insecurity: No Food Insecurity (06/08/2024)  Housing: Low Risk  (06/08/2024)  Transportation Needs: No Transportation Needs  (06/08/2024)  Utilities: Not At Risk (06/08/2024)  Depression (PHQ2-9): Low Risk  (03/20/2024)  Financial Resource Strain: Low Risk  (05/17/2024)   Received from [facility]  Social Connections: Unknown (06/08/2024)  Tobacco Use: Low Risk  (06/07/2024)   SDOH Interventions:     Readmission Risk Interventions     No data to display

## 2024-06-08 NOTE — Assessment & Plan Note (Signed)
-   The patient will be admitted to a medical telemetry bed. - Will continue antibiotic therapy with IV Rocephin and Zithromax. - Mucolytic therapy be provided as well as duo nebs q.i.d. and q.4 hours p.r.n. - We will follow blood cultures. - The patient will be hydrated with IV lactated ringer.

## 2024-06-08 NOTE — Assessment & Plan Note (Signed)
 Continue letrozole.

## 2024-06-09 ENCOUNTER — Inpatient Hospital Stay
Admit: 2024-06-09 | Discharge: 2024-06-09 | Disposition: A | Discharge status: Home / Self Care | Attending: Osteopathic Medicine | Admitting: Osteopathic Medicine

## 2024-06-09 ENCOUNTER — Other Ambulatory Visit: Payer: Self-pay

## 2024-06-09 DIAGNOSIS — A419 Sepsis, unspecified organism: Secondary | ICD-10-CM | POA: Diagnosis not present

## 2024-06-09 DIAGNOSIS — J189 Pneumonia, unspecified organism: Secondary | ICD-10-CM | POA: Diagnosis not present

## 2024-06-09 DIAGNOSIS — R0600 Dyspnea, unspecified: Secondary | ICD-10-CM | POA: Diagnosis not present

## 2024-06-09 LAB — ECHOCARDIOGRAM COMPLETE
AR max vel: 1.72 cm2
AV Area VTI: 1.87 cm2
AV Area mean vel: 1.7 cm2
AV Mean grad: 11.3 mmHg
AV Peak grad: 20.8 mmHg
Ao pk vel: 2.28 m/s
Area-P 1/2: 2.54 cm2
Height: 63 in
MV VTI: 2.52 cm2
S' Lateral: 2.8 cm
Weight: 2846.58 [oz_av]

## 2024-06-09 LAB — BASIC METABOLIC PANEL WITH GFR
Anion gap: 13 (ref 5–15)
BUN: 16 mg/dL (ref 8–23)
CO2: 27 mmol/L (ref 22–32)
Calcium: 8.8 mg/dL — ABNORMAL LOW (ref 8.9–10.3)
Chloride: 101 mmol/L (ref 98–111)
Creatinine, Ser: 0.66 mg/dL (ref 0.44–1.00)
GFR, Estimated: >60 mL/min (ref >60)
Glucose, Bld: 106 mg/dL — ABNORMAL HIGH (ref 70–99)
Potassium: 3.5 mmol/L (ref 3.5–5.1)
Sodium: 141 mmol/L (ref 135–145)

## 2024-06-09 MED ORDER — HYDROCOD POLI-CHLORPHE POLI ER 10-8 MG/5ML PO SUER
5.0000 mL | Freq: Two times a day (BID) | ORAL | 0 refills | Status: DC | PRN
  Filled 2024-06-09: qty 70, 7d supply, fill #0

## 2024-06-09 MED ORDER — AZITHROMYCIN 250 MG PO TABS
500.0000 mg | ORAL_TABLET | Freq: Every day | ORAL | Status: AC
  Administered 2024-06-09 – 2024-06-11 (×3): 500 mg via ORAL
  Filled 2024-06-09 (×3): qty 2

## 2024-06-09 MED ORDER — AZITHROMYCIN 250 MG PO TABS
250.0000 mg | ORAL_TABLET | Freq: Every day | ORAL | 0 refills | Status: AC
  Filled 2024-06-09: qty 2, 2d supply, fill #0

## 2024-06-09 MED ORDER — FUROSEMIDE 10 MG/ML IJ SOLN
40.0000 mg | Freq: Once | INTRAMUSCULAR | Status: AC
  Administered 2024-06-09: 40 mg via INTRAVENOUS
  Filled 2024-06-09: qty 4

## 2024-06-09 MED ORDER — DILTIAZEM HCL 60 MG PO TABS
60.0000 mg | ORAL_TABLET | Freq: Two times a day (BID) | ORAL | 0 refills | Status: AC
  Filled 2024-06-09: qty 60, 30d supply, fill #0

## 2024-06-09 MED ORDER — FUROSEMIDE 20 MG PO TABS
40.0000 mg | ORAL_TABLET | Freq: Every day | ORAL | 0 refills | Status: DC
  Filled 2024-06-09: qty 90, 45d supply, fill #0

## 2024-06-09 MED ORDER — CEFDINIR 300 MG PO CAPS
300.0000 mg | ORAL_CAPSULE | Freq: Two times a day (BID) | ORAL | 0 refills | Status: AC
  Filled 2024-06-09: qty 10, 5d supply, fill #0

## 2024-06-09 NOTE — Progress Notes (Signed)
 PROGRESS NOTE    Erika Schmidt   FMW:[id number] DOB: 1956/07/18  DOA: 06/07/2024 Date of Service: 06/09/24 which is hospital day 2  PCP: Sherial Bail, MD    Hospital course / significant events:   Erika Schmidt is a 68 y.o. 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  The patient denied any fever or chills. EMS applied CPAP. Chronic 3L O2 at home   10/29: to ED. On BiPAP. CTA of the chest revealed no evidence for PE or aortic dissection. Showed chronic fibrotic lung disease and increased ground glass disease in both lungs most evident in the right upper lobe compared to prior. It was difficult to exclude superimposed postacute and infection versus progression of chronic interstitial lung disease. Started on cefepime. Admitted to hospitalist.  10/30: pt notes she had not taken her lasix  a few days before SOB developed. Question HFpEF but normal BNP and clinically improved w/o much diuresis, will repeat Echo 10/31: echo EF 50-55, Grade 1 diast df. Good UOP through yesterday. Was hoping for discharge but t significant desat on ambulation and required wheelchair back to room but recovered w/ rest      Consultants:  none  Procedures/Surgeries: none      ASSESSMENT & PLAN:   Sepsis vs SIRS  Acute on chronic respiratory failure with hypoxia  due to pneumonia d/t ILD-related decompensation or HFpEF Suspect current hypoxic exacerbation is multifactorial: HFpEF  exacerbation given recently not taking lasix  and improvement w/ diuresis. 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 Slight increased ground-glass disease within the bilateral lungs, most evident in the right upper lobe compared to prior, difficult to exclude superimposed post acute infection versus progression of chronic interstitial lung disease. O2 to supplement Continue home inhaler (symbicort to substitute here) Management of underlying cause(s) Steroids continue home dose, will not escalate at this time on hospital day 2 w/ relative improvement treating as otherwise noted  IV Rocephin and Zithromax. Mucolytic  duo nebs q.i.d. and q.4 hours p.r.n.   Dyspnea, orthopnea, rales - clinically CHF w/ echo last on file 02/2023 preserved EF and no noted diastolic dysfunction Normal BNP suggests against CHF exacerbation but clinically there is correlation w/ missing lasix  at home and worsening respiratory status, rales on exam  Net IO Since Admission: -2,042.47 mL [06/09/24 1743] Strict I&O Lasix  40 mg IV x1 yesterday and x2 today Echo repeat (last on file 02/2023 preserved EF, no diastolic df noted, no valvular df noted) EF now at 50-55 and Grade 1 diast df noted   Benign essential hypertension continue antihypertensive therapy reduced cardizem d/t lower BP this morning    Dyslipidemia statin   History of breast cancer letrozole .   Controlled type 2 diabetes mellitus without complication, without long-term current use of insulin  Hold metformin Monitor Glc, initiate SSI /insulin  as needed     Class 1  obesity based on BMI: Body mass index is 31.52 kg/m.Erika Schmidt Significantly low or high BMI is associated with higher medical  risk.  Underweight - under 18  overweight - 25 to 29 obese - 30 or more Class 1 obesity: BMI of 30.0 to 34 Class 2 obesity: BMI of 35.0 to 39 Class 3 obesity: BMI of 40.0 to 49 Super Morbid Obesity: BMI 50-59 Super-super Morbid Obesity: BMI 60+ Healthy nutrition and physical activity advised as adjunct to other disease management and risk reduction treatments    DVT prophylaxis: lovenox  IV fluids: dc continuous IV fluids  Nutrition: cardiac/carb Central lines / other devices: none  Code Status: FULL CODE ACP documentation reviewed: none on file in [facility]  TOC needs: TBD Medical barriers to dispo: increased O2 requirement, diuresing. Expected medical readiness for discharge pending O2 to baseline or close to that, hoping tomorrow as she desateed on ambulation today              Subjective / Brief ROS:  Patient reports breathing better today but on ambulation there was significant SOB per RN/OT Denies CP/SOB at rest  Pain controlled.  Denies new weakness.  Tolerating diet.  Reports no concerns w/ urination/defecation. Reports urinating a lot now that her lasix  has been resumed   Family Communication: none at thist ime    Objective Findings:  Vitals:   06/09/24 0817 06/09/24 0829 06/09/24 1340 06/09/24 1540  BP: (!) 99/51 114/66 104/61 (!) 103/51  Pulse: 67  68 75  Resp: 16  17 17   Temp: 98.5 F (36.9 C)  98.5 F (36.9 C) 98 F (36.7 C)  TempSrc: Oral     SpO2: 100%  98% 100%  Weight:      Height:        Intake/Output Summary (Last 24 hours) at 06/09/2024 1744 Last data filed at 06/09/2024 1500 Gross per 24 hour  Intake 1210 ml  Output 1700 ml  Net -490 ml   Filed Weights   06/08/24 0030  Weight: 80.7 kg    Examination:  Physical Exam Constitutional:      General: She is not in acute distress. Cardiovascular:     Rate and Rhythm: Normal rate and regular rhythm.     Heart sounds: No murmur heard. Pulmonary:     Breath sounds: Examination  of the right-lower field reveals rales. Examination of the left-lower field reveals rales. Rales present. No decreased breath sounds or wheezing.  Musculoskeletal:     Right lower leg: No edema.     Left lower leg: No edema.  Neurological:     General: No focal deficit present.     Mental Status: She is alert and oriented to person, place, and time.  Psychiatric:        Mood and Affect: Mood normal.        Behavior: Behavior normal.          Scheduled Medications:   aspirin  EC  81 mg Oral Daily   atorvastatin   40 mg Oral Daily   azithromycin  500 mg Oral Daily   cholecalciferol  2,000 Units Oral Daily   diltiazem  60 mg Oral Q12H   enoxaparin  (LOVENOX ) injection  40 mg Subcutaneous Q24H   fluticasone furoate-vilanterol  1 puff Inhalation Daily   furosemide   40 mg Intravenous Once   guaiFENesin   600 mg Oral BID   letrozole   2.5 mg Oral Daily   losartan  50 mg Oral Daily   potassium chloride  20 mEq  Oral Daily   predniSONE   2.5 mg Oral Q breakfast    Continuous Infusions:  cefTRIAXone (ROCEPHIN)  IV 2 g (06/09/24 0841)    PRN Medications:  acetaminophen  **OR** acetaminophen , chlorpheniramine-HYDROcodone, magnesium hydroxide, ondansetron  **OR** ondansetron  (ZOFRAN ) IV, traMADol , traZODone  Antimicrobials from admission:  Anti-infectives (From admission, onward)    Start     Dose/Rate Route Frequency Ordered Stop   06/10/24 0000  azithromycin (ZITHROMAX) 250 MG tablet       Note to Pharmacy: MEDS TO BED [facility]   250 mg Oral Daily 06/09/24 1431 06/12/24 2359   06/10/24 0000  cefdinir (OMNICEF) 300 MG capsule       Note to Pharmacy: MEDS TO BED [facility]   300 mg Oral 2 times daily 06/09/24 1431 06/15/24 2359   06/09/24 1300  azithromycin (ZITHROMAX) tablet 500 mg        500 mg Oral Daily 06/09/24 1147 06/12/24 1259   06/08/24 2200  azithromycin (ZITHROMAX) 500 mg in sodium chloride  0.9 % 250 mL IVPB  Status:  Discontinued        500 mg 250 mL/hr over 60 Minutes Intravenous  Every 24 hours 06/08/24 0142 06/09/24 1146   06/08/24 0800  cefTRIAXone (ROCEPHIN) 2 g in sodium chloride  0.9 % 100 mL IVPB        2 g 200 mL/hr over 30 Minutes Intravenous Every 24 hours 06/08/24 0142 06/13/24 0759   06/07/24 2345  ceFEPIme (MAXIPIME) 2 g in sodium chloride  0.9 % 100 mL IVPB        2 g 200 mL/hr over 30 Minutes Intravenous  Once 06/07/24 2333 06/08/24 0050   06/07/24 2345  azithromycin (ZITHROMAX) 500 mg in sodium chloride  0.9 % 250 mL IVPB  Status:  Discontinued        500 mg 250 mL/hr over 60 Minutes Intravenous Every 24 hours 06/07/24 2333 06/08/24 0145           Data Reviewed:  I have personally reviewed the following...  CBC: Recent Labs  Lab 06/07/24 2200 06/08/24 0517  WBC 12.7* 10.8*  NEUTROABS 8.8*  --   HGB 15.3* 13.5  HCT 49.0* 41.4  MCV 84.8 82.0  PLT PLATELET CLUMPS NOTED ON SMEAR, UNABLE TO ESTIMATE 238   Basic Metabolic Panel: Recent Labs  Lab 06/07/24 2200 06/08/24 0517 06/09/24 0529  NA 139 138 141  K 4.1 4.1 3.5  CL 102 102 101  CO2 24 27 27   GLUCOSE 93 112* 106*  BUN 10 10 16   CREATININE 0.59 0.62 0.66  CALCIUM  9.6 8.9 8.8*   GFR: Estimated Creatinine Clearance: 67.7 mL/min (by C-G formula based on SCr of 0.66 mg/dL). Liver Function Tests: Recent Labs  Lab 06/07/24 2200  AST 69*  ALT 32  ALKPHOS 107  BILITOT 2.0*  PROT 8.5*  ALBUMIN 3.8   No results for input(s): LIPASE, AMYLASE in the last 168 hours. No results for input(s): AMMONIA in the last 168 hours. Coagulation Profile: Recent Labs  Lab 06/08/24 0517  INR 1.1   Cardiac Enzymes: No results for input(s): CKTOTAL, CKMB, CKMBINDEX, TROPONINI in the last 168 hours. BNP (last 3 results) No results for input(s): PROBNP in the last 8760 hours. HbA1C: No results for input(s): HGBA1C in the last 72 hours. CBG: No results for input(s): GLUCAP in the last 168 hours. Lipid Profile: No results for input(s): CHOL, HDL, LDLCALC, TRIG,  CHOLHDL, LDLDIRECT in the last 72 hours. Thyroid Function Tests: No results for input(s): TSH, T4TOTAL, FREET4, T3FREE,  THYROIDAB in the last 72 hours. Anemia Panel: No results for input(s): VITAMINB12, FOLATE, FERRITIN, TIBC, IRON, RETICCTPCT in the last 72 hours. Most Recent Urinalysis On File:     Component Value Date/Time   COLORURINE STRAW (A) 06/07/2024 2315   APPEARANCEUR CLEAR (A) 06/07/2024 2315   LABSPEC 1.006 06/07/2024 2315   PHURINE 6.0 06/07/2024 2315   GLUCOSEU NEGATIVE 06/07/2024 2315   HGBUR NEGATIVE 06/07/2024 2315   BILIRUBINUR NEGATIVE 06/07/2024 2315   KETONESUR NEGATIVE 06/07/2024 2315   PROTEINUR NEGATIVE 06/07/2024 2315   NITRITE NEGATIVE 06/07/2024 2315   LEUKOCYTESUR NEGATIVE 06/07/2024 2315   Sepsis Labs: @LABRCNTIP (procalcitonin:4,lacticidven:4) Microbiology: Recent Results (from the past 240 hours)  Blood Culture (routine x 2)     Status: None (Preliminary result)   Collection Time: 06/07/24  8:35 PM   Specimen: BLOOD  Result Value Ref Range Status   Specimen Description BLOOD LEFT HAND  Final   Special Requests   Final    BOTTLES DRAWN AEROBIC AND ANAEROBIC Blood Culture results may not be optimal due to an inadequate volume of blood received in culture bottles   Culture   Final    NO GROWTH 1 DAY Performed at [facility] [address], [place], [place] [address]    Report Status PENDING  Incomplete  Resp panel by RT-PCR (RSV, Flu A&B, Covid) Anterior Nasal Swab     Status: None   Collection Time: 06/07/24  9:05 PM   Specimen: Anterior Nasal Swab  Result Value Ref Range Status   SARS Coronavirus 2 by RT PCR NEGATIVE NEGATIVE Final    Comment: (NOTE) SARS-CoV-2 target nucleic acids are NOT DETECTED.  The SARS-CoV-2 RNA is generally detectable in upper respiratory specimens during the acute phase of infection. The lowest concentration of SARS-CoV-2 viral copies this assay can detect is 138 copies/mL. A  negative result does not preclude SARS-Cov-2 infection and should not be used as the sole basis for treatment or other patient management decisions. A negative result may occur with  improper specimen collection/handling, submission of specimen other than nasopharyngeal swab, presence of viral mutation(s) within the areas targeted by this assay, and inadequate number of viral copies(<138 copies/mL). A negative result must be combined with clinical observations, patient history, and epidemiological information. The expected result is Negative.  Fact Sheet for Patients:  bloggercourse.com  Fact Sheet for Healthcare Providers:  seriousbroker.it  This test is no t yet approved or cleared by the [place]  FDA and  has been authorized for detection and/or diagnosis of SARS-CoV-2 by FDA under an Emergency Use Authorization (EUA). This EUA will remain  in effect (meaning this test can be used) for the duration of the COVID-19 declaration under Section 564(b)(1) of the Act, 21 U.S.C.section 360bbb-3(b)(1), unless the authorization is terminated  or revoked sooner.       Influenza A by PCR NEGATIVE NEGATIVE Final   Influenza B by PCR NEGATIVE NEGATIVE Final    Comment: (NOTE) The Xpert Xpress SARS-CoV-2/FLU/RSV plus assay is intended as an aid in the diagnosis of influenza from Nasopharyngeal swab specimens and should not be used as a sole basis for treatment. Nasal washings and aspirates are unacceptable for Xpert Xpress SARS-CoV-2/FLU/RSV testing.  Fact Sheet for Patients: bloggercourse.com  Fact Sheet for Healthcare Providers: seriousbroker.it  This test is not yet approved or cleared by the [place]  FDA and has been authorized for detection and/or diagnosis of SARS-CoV-2 by FDA under an Emergency Use Authorization (EUA). This EUA will remain in  effect (meaning this test can  be used) for the duration of the COVID-19 declaration under Section 564(b)(1) of the Act, 21 U.S.C. section 360bbb-3(b)(1), unless the authorization is terminated or revoked.     Resp Syncytial Virus by PCR NEGATIVE NEGATIVE Final    Comment: (NOTE) Fact Sheet for Patients: bloggercourse.com  Fact Sheet for Healthcare Providers: seriousbroker.it  This test is not yet approved or cleared by the [place]  FDA and has been authorized for detection and/or diagnosis of SARS-CoV-2 by FDA under an Emergency Use Authorization (EUA). This EUA will remain in effect (meaning this test can be used) for the duration of the COVID-19 declaration under Section 564(b)(1) of the Act, 21 U.S.C. section 360bbb-3(b)(1), unless the authorization is terminated or revoked.  Performed at [facility], [address] [place], [place] [address]   Blood Culture (routine x 2)     Status: None (Preliminary result)   Collection Time: 06/07/24 10:45 PM   Specimen: BLOOD  Result Value Ref Range Status   Specimen Description BLOOD RIGHT ASSIST CONTROL  Final   Special Requests   Final    BOTTLES DRAWN AEROBIC AND ANAEROBIC Blood Culture adequate volume   Culture   Final    NO GROWTH 1 DAY Performed at [facility], [address] [place], [place] [address]    Report Status PENDING  Incomplete      Radiology Studies last 3 days: ECHOCARDIOGRAM COMPLETE Result Date: 06/09/2024    ECHOCARDIOGRAM REPORT   Patient Name:   TYQUASIA PANT Date of Exam: 06/09/2024 Medical Rec #:  [id number]          Height:       63.0 in Accession #:    [id number]         Weight:       177.9 lb Date of Birth:  02-15-1956          BSA:          1.840 m Patient Age:    68 years           BP:           114/66 mmHg Patient Gender: F                  HR:           67 bpm. Exam Location:  [facility] Procedure: Color Doppler, Cardiac Doppler, Strain Analysis, 2D Echo  and 3D Echo            (Both Spectral and Color Flow Doppler were utilized during            procedure). Indications:     Dyspnea R06.00                  Bilateral rales  History:         Patient has prior history of Echocardiogram examinations, most                  recent 03/30/2023. CHF, Signs/Symptoms:Dyspnea; Risk                  Factors:Hypertension.  Sonographer:     Christopher Furnace Referring Phys:  [id number] LANETA BLUNT Diagnosing Phys: Denyse Bathe  Sonographer Comments: Global longitudinal strain was attempted. IMPRESSIONS  1. Left ventricular ejection fraction, by estimation, is 50 to 55%. The left ventricle has low normal function. The left ventricle has no regional wall motion abnormalities. There is mild concentric left ventricular hypertrophy. Left  ventricular diastolic parameters are consistent with Grade I diastolic dysfunction (impaired relaxation).  2. Right ventricular systolic function is normal. The right ventricular size is normal.  3. The mitral valve is normal in structure. No evidence of mitral valve regurgitation. No evidence of mitral stenosis.  4. The aortic valve is calcified. Aortic valve regurgitation is not visualized. Aortic valve sclerosis/calcification is present, without any evidence of aortic stenosis.  5. The inferior vena cava is normal in size with greater than 50% respiratory variability, suggesting right atrial pressure of 3 mmHg. FINDINGS  Left Ventricle: Left ventricular ejection fraction, by estimation, is 50 to 55%. The left ventricle has low normal function. The left ventricle has no regional wall motion abnormalities. Strain was performed and the global longitudinal strain is indeterminate. The left ventricular internal cavity size was normal in size. There is mild concentric left ventricular hypertrophy. Left ventricular diastolic parameters are consistent with Grade I diastolic dysfunction (impaired relaxation). Right Ventricle: The right ventricular size is normal.  No increase in right ventricular wall thickness. Right ventricular systolic function is normal. Left Atrium: Left atrial size was normal in size. Right Atrium: Right atrial size was normal in size. Pericardium: There is no evidence of pericardial effusion. Mitral Valve: The mitral valve is normal in structure. No evidence of mitral valve regurgitation. No evidence of mitral valve stenosis. MV peak gradient, 4.8 mmHg. The mean mitral valve gradient is 2.0 mmHg. Tricuspid Valve: The tricuspid valve is normal in structure. Tricuspid valve regurgitation is trivial. No evidence of tricuspid stenosis. Aortic Valve: The aortic valve is calcified. Aortic valve regurgitation is not visualized. Aortic valve sclerosis/calcification is present, without any evidence of aortic stenosis. Aortic valve mean gradient measures 11.2 mmHg. Aortic valve peak gradient  measures 20.8 mmHg. Aortic valve area, by VTI measures 1.87 cm. Pulmonic Valve: The pulmonic valve was normal in structure. Pulmonic valve regurgitation is trivial. No evidence of pulmonic stenosis. Aorta: The aortic root is normal in size and structure. Venous: The inferior vena cava is normal in size with greater than 50% respiratory variability, suggesting right atrial pressure of 3 mmHg. IAS/Shunts: No atrial level shunt detected by color flow Doppler. Additional Comments: 3D was performed not requiring image post processing on an independent workstation and was indeterminate.  LEFT VENTRICLE PLAX 2D LVIDd:         3.80 cm   Diastology LVIDs:         2.80 cm   LV e' medial:    7.29 cm/s LV PW:         1.00 cm   LV E/e' medial:  8.6 LV IVS:        0.80 cm   LV e' lateral:   8.38 cm/s LVOT diam:     2.00 cm   LV E/e' lateral: 7.4 LV SV:         76 LV SV Index:   41 LVOT Area:     3.14 cm  RIGHT VENTRICLE RV Basal diam:  3.20 cm RV Mid diam:    2.90 cm RV S prime:     8.70 cm/s TAPSE (M-mode): 1.5 cm LEFT ATRIUM             Index        RIGHT ATRIUM          Index LA  diam:        3.20 cm 1.74 cm/m   RA Area:     8.27 cm LA Vol (A2C):  36.3 ml 19.73 ml/m  RA Volume:   11.70 ml 6.36 ml/m LA Vol (A4C):   53.2 ml 28.92 ml/m LA Biplane Vol: 45.3 ml 24.62 ml/m  AORTIC VALVE AV Area (Vmax):    1.72 cm AV Area (Vmean):   1.70 cm AV Area (VTI):     1.87 cm AV Vmax:           228.25 cm/s AV Vmean:          154.250 cm/s AV VTI:            0.409 m AV Peak Grad:      20.8 mmHg AV Mean Grad:      11.2 mmHg LVOT Vmax:         125.00 cm/s LVOT Vmean:        83.600 cm/s LVOT VTI:          0.243 m LVOT/AV VTI ratio: 0.59  AORTA Ao Root diam: 2.70 cm MITRAL VALVE                TRICUSPID VALVE MV Area (PHT): 2.54 cm     TR Peak grad:   15.1 mmHg MV Area VTI:   2.52 cm     TR Vmax:        194.00 cm/s MV Peak grad:  4.8 mmHg MV Mean grad:  2.0 mmHg     SHUNTS MV Vmax:       1.10 m/s     Systemic VTI:  0.24 m MV Vmean:      60.4 cm/s    Systemic Diam: 2.00 cm MV Decel Time: 299 msec MV E velocity: 62.40 cm/s MV A velocity: 110.00 cm/s MV E/A ratio:  0.57 Shaukat Khan Electronically signed by Denyse Bathe Signature Date/Time: 06/09/2024/3:56:26 PM    Final    CT Angio Chest PE W and/or Wo Contrast Result Date: 06/07/2024 CLINICAL DATA:  Shortness of breath EXAM: CT ANGIOGRAPHY CHEST WITH CONTRAST TECHNIQUE: Multidetector CT imaging of the chest was performed using the standard protocol during bolus administration of intravenous contrast. Multiplanar CT image reconstructions and MIPs were obtained to evaluate the vascular anatomy. RADIATION DOSE REDUCTION: This exam was performed according to the departmental dose-optimization program which includes automated exposure control, adjustment of the mA and/or kV according to patient size and/or use of iterative reconstruction technique. CONTRAST:  75mL OMNIPAQUE IOHEXOL 350 MG/ML SOLN COMPARISON:  Chest x-ray 06/07/2024, chest CT 02/18/2023, 04/03/2022 FINDINGS: Cardiovascular: Satisfactory opacification of the pulmonary arteries to the  segmental level. No evidence of pulmonary embolism. Nonaneurysmal aorta. No dissection. Mild atherosclerosis. Borderline cardiomegaly. No significant pericardial effusion Mediastinum/Nodes: Patent trachea. No thyroid mass. Enlarged right paratracheal node measuring 18 mm. Left anterior hilar node measuring 12 mm. Small right hilar nodes measuring up to 14 mm. Esophagus is within normal limits. Lungs/Pleura: Chronic fibrotic lung disease and areas of ground-glass density. Mild bronchiectasis in the lower lobes. Increased ground-glass disease within the bilateral lungs, most evident in the right upper lobe compared to prior. Negative for pleural effusion or pneumothorax. Upper Abdomen: Gallstones Musculoskeletal: Irregular collection in left breast with some fat density within likely due to lumpectomy change. No acute osseous abnormality. Review of the MIP images confirms the above findings. IMPRESSION: 1. Negative for acute pulmonary embolus or aortic dissection. 2. Chronic fibrotic lung disease. Slight increased ground-glass disease within the bilateral lungs, most evident in the right upper lobe compared to prior, difficult to exclude superimposed post acute infection versus progression of chronic interstitial lung disease.  3. Mild mediastinal and hilar adenopathy, likely reactive, but attention on follow-up imaging. 4. Gallstones. 5. Aortic atherosclerosis. Aortic Atherosclerosis (ICD10-I70.0). Electronically Signed   By: Luke Bun M.D.   On: 06/07/2024 23:13   DG Chest Portable 1 View Result Date: 06/07/2024 EXAM: 1 VIEW(S) XRAY OF THE CHEST 06/07/2024 09:15:54 PM COMPARISON: Comparison is made to chest x-ray dated 10/07/2021 and chest CT dated 02/18/2023. CLINICAL HISTORY: shortness of breath. Pt to ED from home for SOB. HC of CHF. FINDINGS: LUNGS AND PLEURA: Airspace disease in the right upper lobe persists. Fibrotic changes in the lung bases are unchanged. No new focal lung infiltrate. No pulmonary  edema. No pleural effusion. No pneumothorax. HEART AND MEDIASTINUM: No acute abnormality of the cardiac and mediastinal silhouettes. BONES AND SOFT TISSUES: No acute osseous abnormality. IMPRESSION: 1. No acute findings. 2. Persistent airspace disease in the right upper lobe and unchanged fibrotic changes in the lung bases. Electronically signed by: Greig Pique MD 06/07/2024 09:20 PM EDT RP Workstation: [id number]       Time spent: 50 min     Laneta Blunt, DO [facility] 06/09/2024, 5:44 PM    Dictation software may have been used to generate the above note. Typos may occur and escape review in typed/dictated notes. Please contact Dr Blunt directly for clarity if needed.  Staff may message me via secure chat in Epic  but this may not receive an immediate response,  please page me for urgent matters!  If 7PM-7AM, please contact night coverage [email or web address]

## 2024-06-09 NOTE — Progress Notes (Signed)
*  PRELIMINARY RESULTS* Echocardiogram 2D Echocardiogram has been performed.  Erika Schmidt 06/09/2024, 11:47 AM

## 2024-06-09 NOTE — Plan of Care (Signed)

## 2024-06-09 NOTE — Care Management Important Message (Signed)
 Important Message  Patient Details  Name: Erika Schmidt MRN: [id number] Date of Birth: 04/15/1956   Important Message Given:  Yes - Medicare IM     Rojelio SHAUNNA Rattler 06/09/2024, 2:51 PM

## 2024-06-09 NOTE — Progress Notes (Signed)
 PHARMACIST - PHYSICIAN COMMUNICATION DR:   Marsa CONCERNING: Antibiotic IV to Oral Route Change Policy  RECOMMENDATION: This patient is receiving Azithromycin by the intravenous route.  Based on criteria approved by the Pharmacy and Therapeutics Committee, the antibiotic(s) is/are being converted to the equivalent oral dose form(s).   DESCRIPTION: These criteria include: Patient being treated for a respiratory tract infection, urinary tract infection, cellulitis or clostridium difficile associated diarrhea if on metronidazole The patient is not neutropenic and does not exhibit a GI malabsorption state The patient is eating (either orally or via tube) and/or has been taking other orally administered medications for a least 24 hours The patient is improving clinically and has a Tmax < 100.5    Tenecia Ignasiak Rodriguez-Guzman PharmD, BCPS 06/09/2024 11:47 AM

## 2024-06-10 DIAGNOSIS — J189 Pneumonia, unspecified organism: Secondary | ICD-10-CM | POA: Diagnosis not present

## 2024-06-10 DIAGNOSIS — A419 Sepsis, unspecified organism: Secondary | ICD-10-CM | POA: Diagnosis not present

## 2024-06-10 MED ORDER — FUROSEMIDE 10 MG/ML IJ SOLN
40.0000 mg | Freq: Once | INTRAMUSCULAR | Status: AC
  Administered 2024-06-10: 40 mg via INTRAVENOUS
  Filled 2024-06-10: qty 4

## 2024-06-10 NOTE — Progress Notes (Signed)
 PROGRESS NOTE    Erika Schmidt   FMW:[id number] DOB: [birth year]  DOA: 06/07/2024 Date of Service: 06/10/24 which is hospital day 3  PCP: Sherial Bail, MD    Hospital course / significant events:   Erika Schmidt is a 68 y.o. 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  The patient denied any fever or chills. EMS applied CPAP. Chronic 3L O2 at home   10/29: to ED. On BiPAP. CTA of the chest revealed no evidence for PE or aortic dissection. Showed chronic fibrotic lung disease and increased ground glass disease in both lungs most evident in the right upper lobe compared to prior. It was difficult to exclude superimposed postacute and infection versus progression of chronic interstitial lung disease. Started on cefepime. Admitted to hospitalist.  10/30: pt notes she had not taken her lasix  a few days before SOB developed. Question HFpEF but normal BNP and clinically improved w/o much diuresis, will repeat Echo 10/31: echo EF 50-55, Grade 1 diast df. Good UOP through yesterday 1000 mL ind aytime. Pt was hoping for discharge but significant desat on ambulation and required wheelchair back to room but recovered w/ rest. Gave another dose IV lasix .  11/01: 1800 mL UOP overnight. Pt better ambulation today but still SOB, will keep another night and reevaluate tm after further diuresis.      Consultants:  none  Procedures/Surgeries: none      ASSESSMENT & PLAN:    Sepsis vs SIRS  Acute on chronic respiratory failure with hypoxia  due to pneumonia d/t ILD-related decompensation or HFpEF Suspect current hypoxic exacerbation is multifactorial: HFpEF exacerbation given recently not taking lasix  and improvement w/ diuresis. 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 Slight increased ground-glass disease within the bilateral lungs, most evident in the right upper lobe compared to prior, difficult to exclude superimposed post acute infection versus progression of chronic interstitial lung disease. O2 to supplement Continue home inhaler (symbicort to substitute here) Management of underlying cause(s) Steroids continue home dose, will not escalate at this time on hospital day 2 w/ relative improvement treating as otherwise noted  IV Rocephin and Zithromax. Mucolytic  duo nebs q.i.d. and q.4 hours p.r.n.   Dyspnea, orthopnea, rales - clinically CHF w/ echo last on file 02/2023 preserved EF and no noted diastolic dysfunction Normal BNP suggests against CHF exacerbation but clinically there is correlation w/ missing lasix  at home and worsening respiratory status, rales on exam  Net IO Since Admission: -3,282.47 mL [06/10/24 1411] Strict I&O Lasix  40 mg bid Echo repeat (last on file 02/2023 preserved EF, no diastolic df noted, no valvular df noted) EF now at 50-55 and Grade 1 diast df noted   Benign essential hypertension continue antihypertensive therapy reduced cardizem d/t lower BP this morning    Dyslipidemia statin   History of breast cancer letrozole .   Controlled  type 2 diabetes mellitus without complication, without long-term current use of insulin  Hold metformin Monitor Glc,  initiate SSI /insulin  as needed     Class 1 obesity based on BMI: Body mass index is 31.52 kg/m.[name] Significantly low or high BMI is associated with higher medical risk.  Underweight - under 18  overweight - 25 to 29 obese - 30 or more Class 1 obesity: BMI of 30.0 to 34 Class 2 obesity: BMI of 35.0 to 39 Class 3 obesity: BMI of 40.0 to 49 Super Morbid Obesity: BMI 50-59 Super-super Morbid Obesity: BMI 60+ Healthy nutrition and physical activity advised as adjunct to other disease management and risk reduction treatments    DVT prophylaxis: lovenox  IV fluids: dc continuous IV fluids  Nutrition: cardiac/carb Central lines / other devices: none  Code Status: FULL CODE ACP documentation reviewed: none on file in [facility]  TOC needs: TBD Medical barriers to dispo: increased O2 requirement, diuresing. Expected medical readiness for discharge pending O2 to baseline or close to that, hoping tomorrow as she desateed on ambulation today              Subjective / Brief ROS:  Patient reports breathing better today but on ambulation ther is still some SOB.weakness  Denies CP/SOB at rest  Pain controlled.  Denies new weakness.  Tolerating diet.  Reports no concerns w/ urination/defecation. Reports urinating a lot   Family Communication: none at thist ime    Objective Findings:  Vitals:   06/10/24 0600 06/10/24 0804 06/10/24 0900 06/10/24 1211  BP:  104/62  (!) 104/57  Pulse:  79  69  Resp:  18  16  Temp:  98.7 F (37.1 C)  97.8 F (36.6 C)  TempSrc:      SpO2: 96% 96% 98% 100%  Weight:      Height:        Intake/Output Summary (Last 24 hours) at 06/10/2024 1412 Last data filed at 06/10/2024 1034 Gross per 24 hour  Intake 560 ml  Output 2450 ml  Net -1890 ml   Filed Weights   06/08/24 0030  Weight: 80.7 kg    Examination:  Physical Exam Constitutional:      General: She is not in acute distress. Cardiovascular:     Rate and Rhythm: Normal rate and  regular rhythm.     Heart sounds: No murmur heard. Pulmonary:     Breath sounds: Examination of the right-lower field reveals rales. Examination of the left-lower field reveals rales. Rales present. No decreased breath sounds or wheezing.  Musculoskeletal:     Right lower leg: No edema.     Left lower leg: No edema.  Neurological:     General: No focal deficit present.     Mental Status: She is alert and oriented to person, place, and time.  Psychiatric:        Mood and Affect: Mood normal.        Behavior: Behavior normal.          Scheduled Medications:   aspirin  EC  81 mg Oral Daily   atorvastatin   40 mg Oral Daily   azithromycin  500 mg Oral Daily   cholecalciferol  2,000 Units Oral Daily   diltiazem  60 mg Oral Q12H   enoxaparin  (LOVENOX ) injection  40 mg Subcutaneous Q24H   fluticasone furoate-vilanterol  1 puff Inhalation Daily   guaiFENesin   600 mg Oral BID   letrozole   2.5 mg Oral Daily   losartan  50 mg Oral  Daily   potassium chloride  20 mEq Oral Daily   predniSONE   2.5 mg Oral Q breakfast    Continuous Infusions:  cefTRIAXone (ROCEPHIN)  IV 2 g (06/10/24 0824)    PRN Medications:  acetaminophen  **OR** acetaminophen , chlorpheniramine-HYDROcodone, magnesium hydroxide, ondansetron  **OR** ondansetron  (ZOFRAN ) IV, traMADol , traZODone  Antimicrobials from admission:  Anti-infectives (From admission, onward)    Start     Dose/Rate Route Frequency Ordered Stop   06/10/24 0000  azithromycin (ZITHROMAX) 250 MG tablet       Note to Pharmacy: MEDS TO BED [facility]   250 mg Oral Daily 06/09/24 1431 06/12/24 2359   06/10/24 0000  cefdinir (OMNICEF) 300 MG capsule       Note to Pharmacy: MEDS TO BED [facility]   300 mg Oral 2 times daily 06/09/24 1431 06/15/24 2359   06/09/24 1300  azithromycin (ZITHROMAX) tablet 500 mg        500 mg Oral Daily 06/09/24 1147 06/12/24 1259   06/08/24 2200  azithromycin (ZITHROMAX) 500 mg in sodium chloride  0.9 % 250 mL IVPB  Status:   Discontinued        500 mg 250 mL/hr over 60 Minutes Intravenous Every 24 hours 06/08/24 0142 06/09/24 1146   06/08/24 0800  cefTRIAXone (ROCEPHIN) 2 g in sodium chloride  0.9 % 100 mL IVPB        2 g 200 mL/hr over 30 Minutes Intravenous Every 24 hours 06/08/24 0142 06/13/24 0759   06/07/24 2345  ceFEPIme (MAXIPIME) 2 g in sodium chloride  0.9 % 100 mL IVPB        2 g 200 mL/hr over 30 Minutes Intravenous  Once 06/07/24 2333 06/08/24 0050   06/07/24 2345  azithromycin (ZITHROMAX) 500 mg in sodium chloride  0.9 % 250 mL IVPB  Status:  Discontinued        500 mg 250 mL/hr over 60 Minutes Intravenous Every 24 hours 06/07/24 2333 06/08/24 0145           Data Reviewed:  I have personally reviewed the following...  CBC: Recent Labs  Lab 06/07/24 2200 06/08/24 0517  WBC 12.7* 10.8*  NEUTROABS 8.8*  --   HGB 15.3* 13.5  HCT 49.0* 41.4  MCV 84.8 82.0  PLT PLATELET CLUMPS NOTED ON SMEAR, UNABLE TO ESTIMATE 238   Basic Metabolic Panel: Recent Labs  Lab 06/07/24 2200 06/08/24 0517 06/09/24 0529  NA 139 138 141  K 4.1 4.1 3.5  CL 102 102 101  CO2 24 27 27   GLUCOSE 93 112* 106*  BUN 10 10 16   CREATININE 0.59 0.62 0.66  CALCIUM  9.6 8.9 8.8*   GFR: Estimated Creatinine Clearance: 67.7 mL/min (by C-G formula based on SCr of 0.66 mg/dL). Liver Function Tests: Recent Labs  Lab 06/07/24 2200  AST 69*  ALT 32  ALKPHOS 107  BILITOT 2.0*  PROT 8.5*  ALBUMIN 3.8   No results for input(s): LIPASE, AMYLASE in the last 168 hours. No results for input(s): AMMONIA in the last 168 hours. Coagulation Profile: Recent Labs  Lab 06/08/24 0517  INR 1.1   Cardiac Enzymes: No results for input(s): CKTOTAL, CKMB, CKMBINDEX, TROPONINI in the last 168 hours. BNP (last 3 results) No results for input(s): PROBNP in the last 8760 hours. HbA1C: No results for input(s): HGBA1C in the last 72 hours. CBG: No results for input(s): GLUCAP in the last 168 hours. Lipid  Profile: No results for input(s): CHOL, HDL, LDLCALC, TRIG, CHOLHDL, LDLDIRECT in the last 72 hours. Thyroid Function Tests:  No results for input(s): TSH, T4TOTAL, FREET4, T3FREE, THYROIDAB in the last 72 hours. Anemia Panel: No results for input(s): VITAMINB12, FOLATE, FERRITIN, TIBC, IRON, RETICCTPCT in the last 72 hours. Most Recent Urinalysis On File:     Component Value Date/Time   COLORURINE STRAW (A) 06/07/2024 2315   APPEARANCEUR CLEAR (A) 06/07/2024 2315   LABSPEC 1.006 06/07/2024 2315   PHURINE 6.0 06/07/2024 2315   GLUCOSEU NEGATIVE 06/07/2024 2315   HGBUR NEGATIVE 06/07/2024 2315   BILIRUBINUR NEGATIVE 06/07/2024 2315   KETONESUR NEGATIVE 06/07/2024 2315   PROTEINUR NEGATIVE 06/07/2024 2315   NITRITE NEGATIVE 06/07/2024 2315   LEUKOCYTESUR NEGATIVE 06/07/2024 2315   Sepsis Labs: @LABRCNTIP (procalcitonin:4,lacticidven:4) Microbiology: Recent Results (from the past 240 hours)  Blood Culture (routine x 2)     Status: None (Preliminary result)   Collection Time: 06/07/24  8:35 PM   Specimen: BLOOD  Result Value Ref Range Status   Specimen Description BLOOD LEFT HAND  Final   Special Requests   Final    BOTTLES DRAWN AEROBIC AND ANAEROBIC Blood Culture results may not be optimal due to an inadequate volume of blood received in culture bottles   Culture   Final    NO GROWTH 2 DAYS Performed at [facility], [address], [place], [place] [address]    Report Status PENDING  Incomplete  Resp panel by RT-PCR (RSV, Flu A&B, Covid) Anterior Nasal Swab     Status: None   Collection Time: 06/07/24  9:05 PM   Specimen: Anterior Nasal Swab  Result Value Ref Range Status   SARS Coronavirus 2 by RT PCR NEGATIVE NEGATIVE Final    Comment: (NOTE) SARS-CoV-2 target nucleic acids are NOT DETECTED.  The SARS-CoV-2 RNA is generally detectable in upper respiratory specimens during the acute phase of infection. The lowest concentration  of SARS-CoV-2 viral copies this assay can detect is 138 copies/mL. A negative result does not preclude SARS-Cov-2 infection and should not be used as the sole basis for treatment or other patient management decisions. A negative result may occur with  improper specimen collection/handling, submission of specimen other than nasopharyngeal swab, presence of viral mutation(s) within the areas targeted by this assay, and inadequate number of viral copies(<138 copies/mL). A negative result must be combined with clinical observations, patient history, and epidemiological information. The expected result is Negative.  Fact Sheet for Patients:  bloggercourse.com  Fact Sheet for Healthcare Providers:  seriousbroker.it  This test is no t yet approved or cleared by the [place]  FDA and  has been authorized for detection and/or diagnosis of SARS-CoV-2 by FDA under an Emergency Use Authorization (EUA). This EUA will remain  in effect (meaning this test can be used) for the duration of the COVID-19 declaration under Section 564(b)(1) of the Act, 21 U.S.C.section 360bbb-3(b)(1), unless the authorization is terminated  or revoked sooner.       Influenza A by PCR NEGATIVE NEGATIVE Final   Influenza B by PCR NEGATIVE NEGATIVE Final    Comment: (NOTE) The Xpert Xpress SARS-CoV-2/FLU/RSV plus assay is intended as an aid in the diagnosis of influenza from Nasopharyngeal swab specimens and should not be used as a sole basis for treatment. Nasal washings and aspirates are unacceptable for Xpert Xpress SARS-CoV-2/FLU/RSV testing.  Fact Sheet for Patients: bloggercourse.com  Fact Sheet for Healthcare Providers: seriousbroker.it  This test is not yet approved or cleared by the [place]  FDA and has been authorized for detection and/or diagnosis of SARS-CoV-2 by FDA under an Emergency  Use  Authorization (EUA). This EUA will remain in effect (meaning this test can be used) for the duration of the COVID-19 declaration under Section 564(b)(1) of the Act, 21 U.S.C. section 360bbb-3(b)(1), unless the authorization is terminated or revoked.     Resp Syncytial Virus by PCR NEGATIVE NEGATIVE Final    Comment: (NOTE) Fact Sheet for Patients: bloggercourse.com  Fact Sheet for Healthcare Providers: seriousbroker.it  This test is not yet approved or cleared by the [place]  FDA and has been authorized for detection and/or diagnosis of SARS-CoV-2 by FDA under an Emergency Use Authorization (EUA). This EUA will remain in effect (meaning this test can be used) for the duration of the COVID-19 declaration under Section 564(b)(1) of the Act, 21 U.S.C. section 360bbb-3(b)(1), unless the authorization is terminated or revoked.  Performed at [facility], [address] [place], [place] [address]   Blood Culture (routine x 2)     Status: None (Preliminary result)   Collection Time: 06/07/24 10:45 PM   Specimen: BLOOD  Result Value Ref Range Status   Specimen Description BLOOD RIGHT ASSIST CONTROL  Final   Special Requests   Final    BOTTLES DRAWN AEROBIC AND ANAEROBIC Blood Culture adequate volume   Culture   Final    NO GROWTH 2 DAYS Performed at [facility], [address], [place], [place] [address]    Report Status PENDING  Incomplete      Radiology Studies last 3 days: ECHOCARDIOGRAM COMPLETE Result Date: 06/09/2024    ECHOCARDIOGRAM REPORT   Patient Name:   TAMIRRA SIENKIEWICZ Date of Exam: 06/09/2024 Medical Rec #:  [id number]          Height:       63.0 in Accession #:    [id number]         Weight:       177.9 lb Date of Birth:  04/29/1956          BSA:          1.840 m Patient Age:    68 years           BP:           114/66 mmHg Patient Gender: F                  HR:           67 bpm. Exam  Location:  [facility] Procedure: Color Doppler, Cardiac Doppler, Strain Analysis, 2D Echo and 3D Echo            (Both Spectral and Color Flow Doppler were utilized during            procedure). Indications:     Dyspnea R06.00                  Bilateral rales  History:         Patient has prior history of Echocardiogram examinations, most                  recent 03/30/2023. CHF, Signs/Symptoms:Dyspnea; Risk                  Factors:Hypertension.  Sonographer:     Christopher Furnace Referring Phys:  [id number] LANETA BLUNT Diagnosing Phys: Denyse Bathe  Sonographer Comments: Global longitudinal strain was attempted. IMPRESSIONS  1. Left ventricular ejection fraction, by estimation, is 50 to 55%. The left ventricle has low normal function. The left ventricle has no regional wall motion abnormalities.  There is mild concentric left ventricular hypertrophy. Left ventricular diastolic parameters are consistent with Grade I diastolic dysfunction (impaired relaxation).  2. Right ventricular systolic function is normal. The right ventricular size is normal.  3. The mitral valve is normal in structure. No evidence of mitral valve regurgitation. No evidence of mitral stenosis.  4. The aortic valve is calcified. Aortic valve regurgitation is not visualized. Aortic valve sclerosis/calcification is present, without any evidence of aortic stenosis.  5. The inferior vena cava is normal in size with greater than 50% respiratory variability, suggesting right atrial pressure of 3 mmHg. FINDINGS  Left Ventricle: Left ventricular ejection fraction, by estimation, is 50 to 55%. The left ventricle has low normal function. The left ventricle has no regional wall motion abnormalities. Strain was performed and the global longitudinal strain is indeterminate. The left ventricular internal cavity size was normal in size. There is mild concentric left ventricular hypertrophy. Left ventricular diastolic parameters are consistent with Grade I diastolic  dysfunction (impaired relaxation). Right Ventricle: The right ventricular size is normal. No increase in right ventricular wall thickness. Right ventricular systolic function is normal. Left Atrium: Left atrial size was normal in size. Right Atrium: Right atrial size was normal in size. Pericardium: There is no evidence of pericardial effusion. Mitral Valve: The mitral valve is normal in structure. No evidence of mitral valve regurgitation. No evidence of mitral valve stenosis. MV peak gradient, 4.8 mmHg. The mean mitral valve gradient is 2.0 mmHg. Tricuspid Valve: The tricuspid valve is normal in structure. Tricuspid valve regurgitation is trivial. No evidence of tricuspid stenosis. Aortic Valve: The aortic valve is calcified. Aortic valve regurgitation is not visualized. Aortic valve sclerosis/calcification is present, without any evidence of aortic stenosis. Aortic valve mean gradient measures 11.2 mmHg. Aortic valve peak gradient  measures 20.8 mmHg. Aortic valve area, by VTI measures 1.87 cm. Pulmonic Valve: The pulmonic valve was normal in structure. Pulmonic valve regurgitation is trivial. No evidence of pulmonic stenosis. Aorta: The aortic root is normal in size and structure. Venous: The inferior vena cava is normal in size with greater than 50% respiratory variability, suggesting right atrial pressure of 3 mmHg. IAS/Shunts: No atrial level shunt detected by color flow Doppler. Additional Comments: 3D was performed not requiring image post processing on an independent workstation and was indeterminate.  LEFT VENTRICLE PLAX 2D LVIDd:         3.80 cm   Diastology LVIDs:         2.80 cm   LV e' medial:    7.29 cm/s LV PW:         1.00 cm   LV E/e' medial:  8.6 LV IVS:        0.80 cm   LV e' lateral:   8.38 cm/s LVOT diam:     2.00 cm   LV E/e' lateral: 7.4 LV SV:         76 LV SV Index:   41 LVOT Area:     3.14 cm  RIGHT VENTRICLE RV Basal diam:  3.20 cm RV Mid diam:    2.90 cm RV S prime:     8.70 cm/s  TAPSE (M-mode): 1.5 cm LEFT ATRIUM             Index        RIGHT ATRIUM          Index LA diam:        3.20 cm 1.74 cm/m   RA Area:  8.27 cm LA Vol (A2C):   36.3 ml 19.73 ml/m  RA Volume:   11.70 ml 6.36 ml/m LA Vol (A4C):   53.2 ml 28.92 ml/m LA Biplane Vol: 45.3 ml 24.62 ml/m  AORTIC VALVE AV Area (Vmax):    1.72 cm AV Area (Vmean):   1.70 cm AV Area (VTI):     1.87 cm AV Vmax:           228.25 cm/s AV Vmean:          154.250 cm/s AV VTI:            0.409 m AV Peak Grad:      20.8 mmHg AV Mean Grad:      11.2 mmHg LVOT Vmax:         125.00 cm/s LVOT Vmean:        83.600 cm/s LVOT VTI:          0.243 m LVOT/AV VTI ratio: 0.59  AORTA Ao Root diam: 2.70 cm MITRAL VALVE                TRICUSPID VALVE MV Area (PHT): 2.54 cm     TR Peak grad:   15.1 mmHg MV Area VTI:   2.52 cm     TR Vmax:        194.00 cm/s MV Peak grad:  4.8 mmHg MV Mean grad:  2.0 mmHg     SHUNTS MV Vmax:       1.10 m/s     Systemic VTI:  0.24 m MV Vmean:      60.4 cm/s    Systemic Diam: 2.00 cm MV Decel Time: 299 msec MV E velocity: 62.40 cm/s MV A velocity: 110.00 cm/s MV E/A ratio:  0.57 Shaukat Khan Electronically signed by Denyse Bathe Signature Date/Time: 06/09/2024/3:56:26 PM    Final    CT Angio Chest PE W and/or Wo Contrast Result Date: 06/07/2024 CLINICAL DATA:  Shortness of breath EXAM: CT ANGIOGRAPHY CHEST WITH CONTRAST TECHNIQUE: Multidetector CT imaging of the chest was performed using the standard protocol during bolus administration of intravenous contrast. Multiplanar CT image reconstructions and MIPs were obtained to evaluate the vascular anatomy. RADIATION DOSE REDUCTION: This exam was performed according to the departmental dose-optimization program which includes automated exposure control, adjustment of the mA and/or kV according to patient size and/or use of iterative reconstruction technique. CONTRAST:  75mL OMNIPAQUE IOHEXOL 350 MG/ML SOLN COMPARISON:  Chest x-ray 06/07/2024, chest CT 02/18/2023, 04/03/2022  FINDINGS: Cardiovascular: Satisfactory opacification of the pulmonary arteries to the segmental level. No evidence of pulmonary embolism. Nonaneurysmal aorta. No dissection. Mild atherosclerosis. Borderline cardiomegaly. No significant pericardial effusion Mediastinum/Nodes: Patent trachea. No thyroid mass. Enlarged right paratracheal node measuring 18 mm. Left anterior hilar node measuring 12 mm. Small right hilar nodes measuring up to 14 mm. Esophagus is within normal limits. Lungs/Pleura: Chronic fibrotic lung disease and areas of ground-glass density. Mild bronchiectasis in the lower lobes. Increased ground-glass disease within the bilateral lungs, most evident in the right upper lobe compared to prior. Negative for pleural effusion or pneumothorax. Upper Abdomen: Gallstones Musculoskeletal: Irregular collection in left breast with some fat density within likely due to lumpectomy change. No acute osseous abnormality. Review of the MIP images confirms the above findings. IMPRESSION: 1. Negative for acute pulmonary embolus or aortic dissection. 2. Chronic fibrotic lung disease. Slight increased ground-glass disease within the bilateral lungs, most evident in the right upper lobe compared to prior, difficult to exclude superimposed post acute infection  versus progression of chronic interstitial lung disease. 3. Mild mediastinal and hilar adenopathy, likely reactive, but attention on follow-up imaging. 4. Gallstones. 5. Aortic atherosclerosis. Aortic Atherosclerosis (ICD10-I70.0). Electronically Signed   By: Luke Bun M.D.   On: 06/07/2024 23:13   DG Chest Portable 1 View Result Date: 06/07/2024 EXAM: 1 VIEW(S) XRAY OF THE CHEST 06/07/2024 09:15:54 PM COMPARISON: Comparison is made to chest x-ray dated 10/07/2021 and chest CT dated 02/18/2023. CLINICAL HISTORY: shortness of breath. Pt to ED from home for SOB. HC of CHF. FINDINGS: LUNGS AND PLEURA: Airspace disease in the right upper lobe persists. Fibrotic  changes in the lung bases are unchanged. No new focal lung infiltrate. No pulmonary edema. No pleural effusion. No pneumothorax. HEART AND MEDIASTINUM: No acute abnormality of the cardiac and mediastinal silhouettes. BONES AND SOFT TISSUES: No acute osseous abnormality. IMPRESSION: 1. No acute findings. 2. Persistent airspace disease in the right upper lobe and unchanged fibrotic changes in the lung bases. Electronically signed by: Greig Pique MD 06/07/2024 09:20 PM EDT RP Workstation: [id number]       Time spent: 50 min     Laneta Blunt, DO [facility] 06/10/2024, 2:12 PM    Dictation software may have been used to generate the above note. Typos may occur and escape review in typed/dictated notes. Please contact Dr Blunt directly for clarity if needed.  Staff may message me via secure chat in Epic  but this may not receive an immediate response,  please page me for urgent matters!  If 7PM-7AM, please contact night coverage [email or web address]

## 2024-06-10 NOTE — Progress Notes (Signed)
 Mobility Specialist - Progress Note  Pre-mobility: HR-102, SpO2-98%  During mobility: HR-126,, SpO2-90%  Post-mobility: HR-119, , SPO2-89%   06/10/24 0900  Oxygen Therapy  SpO2 98 %  O2 Device Nasal Cannula  O2 Flow Rate (L/min) 4 L/min  Mobility  Activity Ambulated with assistance;Stood at bedside;Dangled on edge of bed  Level of Assistance Contact guard assist, steadying assist  Assistive Device None  Distance Ambulated (ft) 45 ft  Range of Motion/Exercises All extremities  Activity Response Tolerated fair  Mobility visit 1 Mobility  Mobility Specialist Start Time (ACUTE ONLY) 0911  Mobility Specialist Stop Time (ACUTE ONLY) [id number]  Mobility Specialist Time Calculation (min) (ACUTE ONLY) 27 min   Pt was having a BM upon entry. Pt agreed to mobility. Pt is on O2 @ 4L. Pt was able to do an O2 test. Pt O2 vitals were taken throughout activity. Pt ambulated well and need one recovery break at nursing station. O2 during ambulation remained above 88% throughout activity. After activity pt returned to the room with needs in reach.  Clem Rodes Mobility Specialist 06/10/24, 9:55 AM

## 2024-06-11 ENCOUNTER — Other Ambulatory Visit: Payer: Self-pay

## 2024-06-11 DIAGNOSIS — A419 Sepsis, unspecified organism: Secondary | ICD-10-CM | POA: Diagnosis not present

## 2024-06-11 DIAGNOSIS — J189 Pneumonia, unspecified organism: Secondary | ICD-10-CM | POA: Diagnosis not present

## 2024-06-11 LAB — BASIC METABOLIC PANEL WITH GFR
Anion gap: 9 (ref 5–15)
BUN: 14 mg/dL (ref 8–23)
CO2: 28 mmol/L (ref 22–32)
Calcium: 9.1 mg/dL (ref 8.9–10.3)
Chloride: 101 mmol/L (ref 98–111)
Creatinine, Ser: 0.83 mg/dL (ref 0.44–1.00)
GFR, Estimated: >60 mL/min (ref >60)
Glucose, Bld: 83 mg/dL (ref 70–99)
Potassium: 4.1 mmol/L (ref 3.5–5.1)
Sodium: 138 mmol/L (ref 135–145)

## 2024-06-11 NOTE — Discharge Instructions (Signed)
 SEE [facility]  INSTRUCTIONS  40 mg once per da all the time Increase to 40 mg twice per day as needed (see instructions) If needing it twice per day a lot, may need to make that the daily dose but would leave that decision up to you primary doctor / cardiologist

## 2024-06-11 NOTE — Progress Notes (Signed)
 Mobility Specialist - Progress Note  Pre-mobility: HR-87,, SpO2- 91%  During mobility: HR-85,  SpO2-89%  Post-mobility: HR-94, , SPO2-93%   06/11/24 0900  Mobility  Activity Ambulated with assistance;Stood at bedside;Dangled on edge of bed  Level of Assistance Contact guard assist, steadying assist  Assistive Device  (IV Stand/O2 Tank)  Distance Ambulated (ft) 45 ft  Range of Motion/Exercises All extremities  Activity Response Tolerated well  Mobility visit 1 Mobility  Mobility Specialist Start Time (ACUTE ONLY) 0914  Mobility Specialist Stop Time (ACUTE ONLY) 0940  Mobility Specialist Time Calculation (min) (ACUTE ONLY) 26 min   Pt was supine in bed with HOB elevated and on O2 @ 4L upon entry. Pt agreed to mobility. Pt O2 vitals were taken throughout activity as a precaution. Pt is able today to get to the EOB with bed features independently. Pt is able to STS independently. Pt ambulated well with O2 @ 4L. Pt O2 maintained above 88% throughout activity today. After activity pt returned to room in bed with needs in reach.  Clem Rodes Mobility Specialist 06/11/24, 9:48 AM

## 2024-06-11 NOTE — Discharge Summary (Signed)
 Physician Discharge Summary   Patient: Erika Schmidt MRN: [id number]  DOB: [birth year]   Admit:     Date of Admission: 06/07/2024 Admitted from: home   Discharge: Date of discharge: 06/11/24 Disposition: Home Condition at discharge: good  CODE STATUS: FULL CODE     Discharge Physician: Laneta Blunt, DO [facility]     PCP: Sherial Bail, MD  Recommendations for Outpatient Follow-up:  Follow up with PCP Sherial Bail, MD in 1 weeks Cardiology follow up needed - initiated diuresis. Referral placed but no appt yet  Pulmonology follow up needed next few week   Discharge Instructions     Ambulatory referral to Cardiology   Complete by: As directed    Hosptial followup HFpEF         Discharge Diagnoses: Principal Problem:   Sepsis due to pneumonia [facility]) Active Problems:   Acute on chronic respiratory failure with hypoxia (HCC)   Benign essential hypertension   Dyslipidemia   History of breast cancer   Controlled type 2 diabetes mellitus without complication, without long-term current use of insulin  [facility])      Hospital course / significant events:   Erika Schmidt is a 68 y.o. 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  The patient denied any fever or chills. EMS applied CPAP. Chronic 3L O2 at home   10/29: to ED. On BiPAP. CTA of the chest revealed no evidence for PE or aortic dissection. Showed chronic fibrotic lung  disease and increased ground glass disease in both lungs most evident in the right upper lobe compared to prior. It was difficult to exclude superimposed postacute and infection versus progression of chronic interstitial lung disease. Started on cefepime. Admitted to hospitalist.  10/30: pt notes she had not taken her lasix  a few days before SOB developed. Question HFpEF but normal BNP and clinically improved w/o much diuresis, will repeat Echo 10/31: echo EF 50-55, Grade 1 diast df. Good UOP through yesterday 1000 mL ind aytime. Pt was hoping for discharge but significant desat on ambulation and required wheelchair back to room but recovered w/ rest. Gave another dose IV lasix .  11/01: 1800 mL UOP overnight. Pt better ambulation today but still SOB, will keep another night and reevaluate tm after further diuresis.  11/02: UOP slowing down, pt feeling better w/ ambulation and feels close to baseline. OK for dc, we discussed diuretic titration as needed and improtance of outpatient follow up      Consultants:  none  Procedures/Surgeries: none      ASSESSMENT & PLAN:   Sepsis vs SIRS  Acute on chronic respiratory failure with hypoxia  due to pneumonia d/t ILD-related decompensation or HFpEF Suspect current hypoxic exacerbation is multifactorial: HFpEF exacerbation given recently not taking lasix  and improvement w/ diuresis. 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 Slight increased ground-glass disease  within the bilateral lungs, most evident in the right upper lobe compared to prior, difficult to exclude superimposed post acute infection versus progression of chronic interstitial lung disease. O2 to supplement Continue  home inhaler (symbicort to substitute here) Management of underlying cause(s) Steroids continue home dose, will not escalate at this time on hospital day 2 w/ relative improvement treating as otherwise noted  IV Rocephin and Zithromax --> Finish po course    Dyspnea, orthopnea, rales - clinically CHF w/ echo last on file 02/2023 preserved EF and no noted diastolic dysfunction Normal BNP suggests against CHF exacerbation but clinically there is correlation w/ missing lasix  at home and worsening respiratory status, rales on exam  Echo repeat (last on file 02/2023 preserved EF, no diastolic df noted, no valvular df noted) EF now at 50-55 and Grade 1 diast df noted  Lasix  40 mg bid --> daily --> daily at home but instructions to titrate up as needed  Cardiology follow up   Benign essential hypertension continue antihypertensive therapy reduced cardizem d/t lower BP and CHF as above Outpatient follow up    Dyslipidemia statin   History of breast cancer letrozole .   Controlled type 2 diabetes mellitus without complication, without long-term current use of insulin  Resume home meds     Class 1 obesity based on BMI: Body mass index is 31.52 kg/m.Erika Schmidt Significantly low or high BMI is associated with higher medical risk.  Underweight - under 18  overweight - 25 to 29 obese - 30 or more Class 1 obesity: BMI of 30.0 to 34 Class 2 obesity: BMI of 35.0 to 39 Class 3 obesity: BMI of 40.0 to 49 Super Morbid Obesity: BMI 50-59 Super-super Morbid Obesity: BMI 60+ Healthy nutrition and physical activity advised as adjunct to other disease management and risk reduction treatments             Discharge Instructions  Allergies as of 06/11/2024       Reactions   Augmentin [amoxicillin-pot Clavulanate] Diarrhea   Floxin [ofloxacin]         Medication List     STOP taking these medications    diltiazem 120 MG 24 hr capsule Commonly known as: CARDIZEM CD       TAKE these  medications    acetaminophen  500 MG tablet Commonly known as: TYLENOL  Take 500 mg by mouth every 8 (eight) hours as needed.   albuterol  108 (90 Base) MCG/ACT inhaler Commonly known as: VENTOLIN  HFA Inhale 2 puffs into the lungs every 6 (six) hours as needed for wheezing or shortness of breath.   aspirin  81 MG tablet Take 1 tablet (81 mg total) by mouth daily.   atorvastatin  40 MG tablet Commonly known as: LIPITOR Take 40 mg by mouth daily.   azithromycin 250 MG tablet Commonly known as: ZITHROMAX Take 1 tablet (250 mg total) by mouth daily for 2 days.   cefdinir 300 MG capsule Commonly known as: OMNICEF Take 1 capsule (300 mg total) by mouth 2 (two) times daily for 5 days.   chlorpheniramine-HYDROcodone 10-8 MG/5ML Commonly known as: TUSSIONEX Take 5 mLs by mouth every 12 (twelve) hours as needed for cough.   Cholecalciferol 50 MCG (2000 UT) Tabs Take 2,000 Units by mouth.   diltiazem 60 MG tablet Commonly known as: CARDIZEM Take 1 tablet (60 mg total) by mouth every 12 (twelve) hours. What changed:  medication strength how much to take when to take this   furosemide  20 MG tablet Commonly known as: LASIX  Take  2 tablets (40 mg total) by mouth daily. Increase to 2 tablet (40 mg total) by mouth TWICE daily (total daily dose 80 mg) as needed for up to 3 days for increased leg swelling, shortness of breath, weight gain 5+ lbs over 1-2 days. Seek medical care if these symptoms are not improving with increased dose. What changed: additional instructions   letrozole  2.5 MG tablet Commonly known as: FEMARA  Take 1 tablet (2.5 mg total) by mouth daily.   losartan 50 MG tablet Commonly known as: COZAAR Take 50 mg by mouth daily.   metFORMIN 500 MG 24 hr tablet Commonly known as: GLUCOPHAGE-XR Take 500 mg by mouth daily with breakfast.   Nucala 100 MG/ML Soaj Generic drug: Mepolizumab   potassium chloride 10 MEQ tablet Commonly known as: KLOR-CON M Take 20 mEq by  mouth daily.   predniSONE  2.5 MG tablet Commonly known as: DELTASONE  Take 2.5 mg by mouth daily with breakfast.   traMADol  50 MG tablet Commonly known as: Ultram  Take 1 tablet (50 mg total) by mouth every 8 (eight) hours as needed.   Wixela Inhub 250-50 MCG/ACT Aepb Generic drug: fluticasone-salmeterol Inhale 1 puff into the lungs in the morning and at bedtime.         Follow-up Information     Sherial Bail, MD. Schedule an appointment as soon as possible for a visit.   Specialty: Internal Medicine Why: HOSPITAL FOLLOW UP ASAP Contact information: [address] [place] [address] [phone]                 Allergies  Allergen Reactions   Augmentin [Amoxicillin-Pot Clavulanate] Diarrhea   Floxin [Ofloxacin]      Subjective: reports breathign better today on ambulation, close to baseline and hopeful for discharge home today. No chest pain, no SOB at rest and notes some SOB on exertion if she is not wearing O2 (went up to the bathroom without it a few times) but better w/ O2   Discharge Exam: BP 110/65 (BP Location: Left Arm)   Pulse 76   Temp 98.3 F (36.8 C)   Resp 18   Ht 5' 3 (1.6 m)   Wt 80.7 kg   SpO2 100%   BMI 31.52 kg/m  General: Pt is alert, awake, not in acute distress Cardiovascular: RRR, S1/S2  Respiratory: mild rales bases Abdominal: Soft, NT, ND, bowel sounds + Extremities: no edema, no cyanosis     The results of significant diagnostics from this hospitalization (including imaging, microbiology, ancillary and laboratory) are listed below for reference.     Microbiology: Recent Results (from the past 240 hours)  Blood Culture (routine x 2)     Status: None (Preliminary result)   Collection Time: 06/07/24  8:35 PM   Specimen: BLOOD  Result Value Ref Range Status   Specimen Description BLOOD LEFT HAND  Final   Special Requests   Final    BOTTLES DRAWN AEROBIC AND ANAEROBIC Blood Culture results may not be  optimal due to an inadequate volume of blood received in culture bottles   Culture   Final    NO GROWTH 3 DAYS Performed at [facility], [address] [place], [place] [address]    Report Status PENDING  Incomplete  Resp panel by RT-PCR (RSV, Flu A&B, Covid) Anterior Nasal Swab     Status: None   Collection Time: 06/07/24  9:05 PM   Specimen: Anterior Nasal Swab  Result Value Ref Range Status   SARS Coronavirus  2 by RT PCR NEGATIVE NEGATIVE Final    Comment: (NOTE) SARS-CoV-2 target nucleic acids are NOT DETECTED.  The SARS-CoV-2 RNA is generally detectable in upper respiratory specimens during the acute phase of infection. The lowest concentration of SARS-CoV-2 viral copies this assay can detect is 138 copies/mL. A negative result does not preclude SARS-Cov-2 infection and should not be used as the sole basis for treatment or other patient management decisions. A negative result may occur with  improper specimen collection/handling, submission of specimen other than nasopharyngeal swab, presence of viral mutation(s) within the areas targeted by this assay, and inadequate number of viral copies(<138 copies/mL). A negative result must be combined with clinical observations, patient history, and epidemiological information. The expected result is Negative.  Fact Sheet for Patients:  bloggercourse.com  Fact Sheet for Healthcare Providers:  seriousbroker.it  This test is no t yet approved or cleared by the [place]  FDA and  has been authorized for detection and/or diagnosis of SARS-CoV-2 by FDA under an Emergency Use Authorization (EUA). This EUA will remain  in effect (meaning this test can be used) for the duration of the COVID-19 declaration under Section 564(b)(1) of the Act, 21 U.S.C.section 360bbb-3(b)(1), unless the authorization is terminated  or revoked sooner.       Influenza A by PCR NEGATIVE NEGATIVE  Final   Influenza B by PCR NEGATIVE NEGATIVE Final    Comment: (NOTE) The Xpert Xpress SARS-CoV-2/FLU/RSV plus assay is intended as an aid in the diagnosis of influenza from Nasopharyngeal swab specimens and should not be used as a sole basis for treatment. Nasal washings and aspirates are unacceptable for Xpert Xpress SARS-CoV-2/FLU/RSV testing.  Fact Sheet for Patients: bloggercourse.com  Fact Sheet for Healthcare Providers: seriousbroker.it  This test is not yet approved or cleared by the [place]  FDA and has been authorized for detection and/or diagnosis of SARS-CoV-2 by FDA under an Emergency Use Authorization (EUA). This EUA will remain in effect (meaning this test can be used) for the duration of the COVID-19 declaration under Section 564(b)(1) of the Act, 21 U.S.C. section 360bbb-3(b)(1), unless the authorization is terminated or revoked.     Resp Syncytial Virus by PCR NEGATIVE NEGATIVE Final    Comment: (NOTE) Fact Sheet for Patients: bloggercourse.com  Fact Sheet for Healthcare Providers: seriousbroker.it  This test is not yet approved or cleared by the [place]  FDA and has been authorized for detection and/or diagnosis of SARS-CoV-2 by FDA under an Emergency Use Authorization (EUA). This EUA will remain in effect (meaning this test can be used) for the duration of the COVID-19 declaration under Section 564(b)(1) of the Act, 21 U.S.C. section 360bbb-3(b)(1), unless the authorization is terminated or revoked.  Performed at [facility], [address] [place], [place] [address]   Blood Culture (routine x 2)     Status: None (Preliminary result)   Collection Time: 06/07/24 10:45 PM   Specimen: BLOOD  Result Value Ref Range Status   Specimen Description BLOOD RIGHT ASSIST CONTROL  Final   Special Requests   Final    BOTTLES DRAWN AEROBIC AND  ANAEROBIC Blood Culture adequate volume   Culture   Final    NO GROWTH 3 DAYS Performed at [facility], [address] [place], [place] [address]    Report Status PENDING  Incomplete     Labs: BNP (last 3 results) Recent Labs    06/07/24 2200  BNP 61.9   Basic Metabolic Panel: Recent Labs  Lab 06/07/24  2200 06/08/24 0517 06/09/24 0529 06/11/24 0434  NA 139 138 141 138  K 4.1 4.1 3.5 4.1  CL 102 102 101 101  CO2 24 27 27 28   GLUCOSE 93 112* 106* 83  BUN 10 10 16 14   CREATININE 0.59 0.62 0.66 0.83  CALCIUM  9.6 8.9 8.8* 9.1   Liver Function Tests: Recent Labs  Lab 06/07/24 2200  AST 69*  ALT 32  ALKPHOS 107  BILITOT 2.0*  PROT 8.5*  ALBUMIN 3.8   No results for input(s): LIPASE, AMYLASE in the last 168 hours. No results for input(s): AMMONIA in the last 168 hours. CBC: Recent Labs  Lab 06/07/24 2200 06/08/24 0517  WBC 12.7* 10.8*  NEUTROABS 8.8*  --   HGB 15.3* 13.5  HCT 49.0* 41.4  MCV 84.8 82.0  PLT PLATELET CLUMPS NOTED ON SMEAR, UNABLE TO ESTIMATE 238   Cardiac Enzymes: No results for input(s): CKTOTAL, CKMB, CKMBINDEX, TROPONINI in the last 168 hours. BNP: Invalid input(s): POCBNP CBG: No results for input(s): GLUCAP in the last 168 hours. D-Dimer No results for input(s): DDIMER in the last 72 hours. Hgb A1c No results for input(s): HGBA1C in the last 72 hours. Lipid Profile No results for input(s): CHOL, HDL, LDLCALC, TRIG, CHOLHDL, LDLDIRECT in the last 72 hours. Thyroid function studies No results for input(s): TSH, T4TOTAL, T3FREE, THYROIDAB in the last 72 hours.  Invalid input(s): FREET3 Anemia work up No results for input(s): VITAMINB12, FOLATE, FERRITIN, TIBC, IRON, RETICCTPCT in the last 72 hours. Urinalysis    Component Value Date/Time   COLORURINE STRAW (A) 06/07/2024 2315   APPEARANCEUR CLEAR (A) 06/07/2024 2315   LABSPEC 1.006 06/07/2024 2315   PHURINE 6.0  06/07/2024 2315   GLUCOSEU NEGATIVE 06/07/2024 2315   HGBUR NEGATIVE 06/07/2024 2315   BILIRUBINUR NEGATIVE 06/07/2024 2315   KETONESUR NEGATIVE 06/07/2024 2315   PROTEINUR NEGATIVE 06/07/2024 2315   NITRITE NEGATIVE 06/07/2024 2315   LEUKOCYTESUR NEGATIVE 06/07/2024 2315   Sepsis Labs Recent Labs  Lab 06/07/24 2200 06/08/24 0517  WBC 12.7* 10.8*   Microbiology Recent Results (from the past 240 hours)  Blood Culture (routine x 2)     Status: None (Preliminary result)   Collection Time: 06/07/24  8:35 PM   Specimen: BLOOD  Result Value Ref Range Status   Specimen Description BLOOD LEFT HAND  Final   Special Requests   Final    BOTTLES DRAWN AEROBIC AND ANAEROBIC Blood Culture results may not be optimal due to an inadequate volume of blood received in culture bottles   Culture   Final    NO GROWTH 3 DAYS Performed at [facility], [address] [place], [place] [address]    Report Status PENDING  Incomplete  Resp panel by RT-PCR (RSV, Flu A&B, Covid) Anterior Nasal Swab     Status: None   Collection Time: 06/07/24  9:05 PM   Specimen: Anterior Nasal Swab  Result Value Ref Range Status   SARS Coronavirus 2 by RT PCR NEGATIVE NEGATIVE Final    Comment: (NOTE) SARS-CoV-2 target nucleic acids are NOT DETECTED.  The SARS-CoV-2 RNA is generally detectable in upper respiratory specimens during the acute phase of infection. The lowest concentration of SARS-CoV-2 viral copies this assay can detect is 138 copies/mL. A negative result does not preclude SARS-Cov-2 infection and should not be used as the sole basis for treatment or other patient management decisions. A negative result may occur with  improper specimen collection/handling, submission of specimen other than nasopharyngeal  swab, presence of viral mutation(s) within the areas targeted by this assay, and inadequate number of viral copies(<138 copies/mL). A negative result must be combined with clinical  observations, patient history, and epidemiological information. The expected result is Negative.  Fact Sheet for Patients:  bloggercourse.com  Fact Sheet for Healthcare Providers:  seriousbroker.it  This test is no t yet approved or cleared by the [place]  FDA and  has been authorized for detection and/or diagnosis of SARS-CoV-2 by FDA under an Emergency Use Authorization (EUA). This EUA will remain  in effect (meaning this test can be used) for the duration of the COVID-19 declaration under Section 564(b)(1) of the Act, 21 U.S.C.section 360bbb-3(b)(1), unless the authorization is terminated  or revoked sooner.       Influenza A by PCR NEGATIVE NEGATIVE Final   Influenza B by PCR NEGATIVE NEGATIVE Final    Comment: (NOTE) The Xpert Xpress SARS-CoV-2/FLU/RSV plus assay is intended as an aid in the diagnosis of influenza from Nasopharyngeal swab specimens and should not be used as a sole basis for treatment. Nasal washings and aspirates are unacceptable for Xpert Xpress SARS-CoV-2/FLU/RSV testing.  Fact Sheet for Patients: bloggercourse.com  Fact Sheet for Healthcare Providers: seriousbroker.it  This test is not yet approved or cleared by the [place]  FDA and has been authorized for detection and/or diagnosis of SARS-CoV-2 by FDA under an Emergency Use Authorization (EUA). This EUA will remain in effect (meaning this test can be used) for the duration of the COVID-19 declaration under Section 564(b)(1) of the Act, 21 U.S.C. section 360bbb-3(b)(1), unless the authorization is terminated or revoked.     Resp Syncytial Virus by PCR NEGATIVE NEGATIVE Final    Comment: (NOTE) Fact Sheet for Patients: bloggercourse.com  Fact Sheet for Healthcare Providers: seriousbroker.it  This test is not yet approved or cleared by  the [place]  FDA and has been authorized for detection and/or diagnosis of SARS-CoV-2 by FDA under an Emergency Use Authorization (EUA). This EUA will remain in effect (meaning this test can be used) for the duration of the COVID-19 declaration under Section 564(b)(1) of the Act, 21 U.S.C. section 360bbb-3(b)(1), unless the authorization is terminated or revoked.  Performed at [facility], [address] [place], [place] [address]   Blood Culture (routine x 2)     Status: None (Preliminary result)   Collection Time: 06/07/24 10:45 PM   Specimen: BLOOD  Result Value Ref Range Status   Specimen Description BLOOD RIGHT ASSIST CONTROL  Final   Special Requests   Final    BOTTLES DRAWN AEROBIC AND ANAEROBIC Blood Culture adequate volume   Culture   Final    NO GROWTH 3 DAYS Performed at [facility], [address], [place], [place] [address]    Report Status PENDING  Incomplete   Imaging ECHOCARDIOGRAM COMPLETE Result Date: 06/09/2024    ECHOCARDIOGRAM REPORT   Patient Name:   Erika Schmidt Date of Exam: 06/09/2024 Medical Rec #:  [id number]          Height:       63.0 in Accession #:    [id number]         Weight:       177.9 lb Date of Birth:  03/05/1956          BSA:          1.840 m Patient Age:    68 years           BP:  114/66 mmHg Patient Gender: F                  HR:           67 bpm. Exam Location:  [facility] Procedure: Color Doppler, Cardiac Doppler, Strain Analysis, 2D Echo and 3D Echo            (Both Spectral and Color Flow Doppler were utilized during            procedure). Indications:     Dyspnea R06.00                  Bilateral rales  History:         Patient has prior history of Echocardiogram examinations, most                  recent 03/30/2023. CHF, Signs/Symptoms:Dyspnea; Risk                  Factors:Hypertension.  Sonographer:     Christopher Furnace Referring Phys:  [id number] LANETA BLUNT Diagnosing Phys: Denyse Bathe  Sonographer Comments:  Global longitudinal strain was attempted. IMPRESSIONS  1. Left ventricular ejection fraction, by estimation, is 50 to 55%. The left ventricle has low normal function. The left ventricle has no regional wall motion abnormalities. There is mild concentric left ventricular hypertrophy. Left ventricular diastolic parameters are consistent with Grade I diastolic dysfunction (impaired relaxation).  2. Right ventricular systolic function is normal. The right ventricular size is normal.  3. The mitral valve is normal in structure. No evidence of mitral valve regurgitation. No evidence of mitral stenosis.  4. The aortic valve is calcified. Aortic valve regurgitation is not visualized. Aortic valve sclerosis/calcification is present, without any evidence of aortic stenosis.  5. The inferior vena cava is normal in size with greater than 50% respiratory variability, suggesting right atrial pressure of 3 mmHg. FINDINGS  Left Ventricle: Left ventricular ejection fraction, by estimation, is 50 to 55%. The left ventricle has low normal function. The left ventricle has no regional wall motion abnormalities. Strain was performed and the global longitudinal strain is indeterminate. The left ventricular internal cavity size was normal in size. There is mild concentric left ventricular hypertrophy. Left ventricular diastolic parameters are consistent with Grade I diastolic dysfunction (impaired relaxation). Right Ventricle: The right ventricular size is normal. No increase in right ventricular wall thickness. Right ventricular systolic function is normal. Left Atrium: Left atrial size was normal in size. Right Atrium: Right atrial size was normal in size. Pericardium: There is no evidence of pericardial effusion. Mitral Valve: The mitral valve is normal in structure. No evidence of mitral valve regurgitation. No evidence of mitral valve stenosis. MV peak gradient, 4.8 mmHg. The mean mitral valve gradient is 2.0 mmHg. Tricuspid Valve:  The tricuspid valve is normal in structure. Tricuspid valve regurgitation is trivial. No evidence of tricuspid stenosis. Aortic Valve: The aortic valve is calcified. Aortic valve regurgitation is not visualized. Aortic valve sclerosis/calcification is present, without any evidence of aortic stenosis. Aortic valve mean gradient measures 11.2 mmHg. Aortic valve peak gradient  measures 20.8 mmHg. Aortic valve area, by VTI measures 1.87 cm. Pulmonic Valve: The pulmonic valve was normal in structure. Pulmonic valve regurgitation is trivial. No evidence of pulmonic stenosis. Aorta: The aortic root is normal in size and structure. Venous: The inferior vena cava is normal in size with greater than 50% respiratory variability, suggesting right atrial pressure of 3 mmHg. IAS/Shunts: No atrial level shunt detected by  color flow Doppler. Additional Comments: 3D was performed not requiring image post processing on an independent workstation and was indeterminate.  LEFT VENTRICLE PLAX 2D LVIDd:         3.80 cm   Diastology LVIDs:         2.80 cm   LV e' medial:    7.29 cm/s LV PW:         1.00 cm   LV E/e' medial:  8.6 LV IVS:        0.80 cm   LV e' lateral:   8.38 cm/s LVOT diam:     2.00 cm   LV E/e' lateral: 7.4 LV SV:         76 LV SV Index:   41 LVOT Area:     3.14 cm  RIGHT VENTRICLE RV Basal diam:  3.20 cm RV Mid diam:    2.90 cm RV S prime:     8.70 cm/s TAPSE (M-mode): 1.5 cm LEFT ATRIUM             Index        RIGHT ATRIUM          Index LA diam:        3.20 cm 1.74 cm/m   RA Area:     8.27 cm LA Vol (A2C):   36.3 ml 19.73 ml/m  RA Volume:   11.70 ml 6.36 ml/m LA Vol (A4C):   53.2 ml 28.92 ml/m LA Biplane Vol: 45.3 ml 24.62 ml/m  AORTIC VALVE AV Area (Vmax):    1.72 cm AV Area (Vmean):   1.70 cm AV Area (VTI):     1.87 cm AV Vmax:           228.25 cm/s AV Vmean:          154.250 cm/s AV VTI:            0.409 m AV Peak Grad:      20.8 mmHg AV Mean Grad:      11.2 mmHg LVOT Vmax:         125.00 cm/s LVOT  Vmean:        83.600 cm/s LVOT VTI:          0.243 m LVOT/AV VTI ratio: 0.59  AORTA Ao Root diam: 2.70 cm MITRAL VALVE                TRICUSPID VALVE MV Area (PHT): 2.54 cm     TR Peak grad:   15.1 mmHg MV Area VTI:   2.52 cm     TR Vmax:        194.00 cm/s MV Peak grad:  4.8 mmHg MV Mean grad:  2.0 mmHg     SHUNTS MV Vmax:       1.10 m/s     Systemic VTI:  0.24 m MV Vmean:      60.4 cm/s    Systemic Diam: 2.00 cm MV Decel Time: 299 msec MV E velocity: 62.40 cm/s MV A velocity: 110.00 cm/s MV E/A ratio:  0.57 Shaukat Khan Electronically signed by Denyse Bathe Signature Date/Time: 06/09/2024/3:56:26 PM    Final       Time coordinating discharge: over 30 minutes  SIGNED:  Aryana Wonnacott DO [facility]

## 2024-06-11 NOTE — Plan of Care (Signed)

## 2024-06-11 NOTE — TOC Transition Note (Signed)
 Transition of Care [facility]) - Discharge Note   Patient Details  Name: MAHA FISCHEL MRN: [id number] Date of Birth: 12-Feb-1956  Transition of [facility]) CM/SW Contact:  Mamta Rimmer L Lyndell Gillyard, LCSW Phone Number: 06/11/2024, 12:08 PM   Clinical Narrative:     Discharge orders. No TOC needs.   TOC signing off.     Barriers to Discharge: Continued Medical Work up   Patient Goals and CMS Choice            Discharge Placement                       Discharge Plan and Services Additional resources added to the After Visit Summary for       Post Acute Care Choice: NA                               Social Drivers of Health (SDOH) Interventions SDOH Screenings   Food Insecurity: No Food Insecurity (06/08/2024)  Housing: Low Risk  (06/08/2024)  Transportation Needs: No Transportation Needs (06/08/2024)  Utilities: Not At Risk (06/08/2024)  Depression (PHQ2-9): Low Risk  (03/20/2024)  Financial Resource Strain: Low Risk  (05/17/2024)   Received from [facility]  Social Connections: Unknown (06/08/2024)  Tobacco Use: Low Risk  (06/07/2024)     Readmission Risk Interventions     No data to display

## 2024-06-12 ENCOUNTER — Other Ambulatory Visit: Payer: Self-pay

## 2024-06-13 LAB — CULTURE, BLOOD (ROUTINE X 2)
Culture: NO GROWTH
Culture: NO GROWTH
Special Requests: ADEQUATE

## 2024-06-22 ENCOUNTER — Ambulatory Visit

## 2024-06-22 VITALS — BP 110/60 | HR 75 | Ht 63.0 in | Wt 175.4 lb

## 2024-06-22 DIAGNOSIS — E782 Mixed hyperlipidemia: Secondary | ICD-10-CM | POA: Diagnosis not present

## 2024-06-22 DIAGNOSIS — I2510 Atherosclerotic heart disease of native coronary artery without angina pectoris: Secondary | ICD-10-CM | POA: Diagnosis not present

## 2024-06-22 DIAGNOSIS — I5032 Chronic diastolic (congestive) heart failure: Secondary | ICD-10-CM | POA: Diagnosis not present

## 2024-06-22 DIAGNOSIS — I700 Atherosclerosis of aorta: Secondary | ICD-10-CM

## 2024-06-22 DIAGNOSIS — J9611 Chronic respiratory failure with hypoxia: Secondary | ICD-10-CM

## 2024-06-22 MED ORDER — DAPAGLIFLOZIN PROPANEDIOL 10 MG PO TABS: 10.0000 mg | ORAL_TABLET | Freq: Every day | ORAL | 3 refills | Status: AC

## 2024-06-22 MED ORDER — FUROSEMIDE 20 MG PO TABS
60.0000 mg | ORAL_TABLET | Freq: Every day | ORAL | 2 refills | Status: AC
Start: 2024-06-22 — End: ?

## 2024-06-22 NOTE — H&P (View-Only) (Signed)
 Cardiology Office Note   Date:  06/22/2024  ID:  Erika Schmidt, DOB 06/15/1956, MRN [id number] PCP: Sherial Bail, MD  [facility] Providers Cardiologist:  Caron Poser, MD     History of Present Illness Erika Schmidt is a 68 y.o. female PMH DM2, HTN, HLD, reported HFpEF, breast cancer, chronic hypoxemic respiratory failure secondary to ILD on home oxygen who presents for further evaluation management of HFpEF.  Patient was recently hospitalized and discharged 06/11/2024 for dyspnea.  Patient was treated with diuretics and antibiotics which seem to get her near her known baseline.  BNP was negative.  Echo showed preserved ejection fraction with grade 1 diastolic dysfunction.  Last LDL 89 09/2022.  She notes she is overall still at her baseline.  She has been taking an increased dose of Lasix  since discharge.  I discussed obtaining a right heart cath with her today since it is somewhat unclear to me what is the primary driving force behind her symptoms and recent hospitalization.  She was initially hesitant but then was willing to give it a try.  Relevant CVD History -TTE 06/09/2024 LVEF 50 to 55%, grade 1 diastolic dysfunction, no significant valve disease -CTPA 06/07/2024 with aortic atherosclerosis and CAC -TEE 03/2023 normal biventricular function without valvular disease.  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 - Exercise stress SPECT 12/2019 normal perfusion, normal ECG, normal hemodynamics   ROS: Pt denies any chest discomfort, jaw pain, arm pain, palpitations, syncope, presyncope, orthopnea, PND, or LE edema.  Studies Reviewed I have independently reviewed the patient's ECG, CT scan, recent medical records, recent blood work.  Physical Exam VS:  BP 110/60   Pulse 75   Ht 5' 3 (1.6 m)   Wt 175 lb 6.4 oz (79.6 kg)   BMI  31.07 kg/m        Wt Readings from Last 3 Encounters:  06/22/24 175 lb 6.4 oz (79.6 kg)  06/08/24 177 lb 14.6 oz (80.7 kg)  04/25/24 180 lb 4.8 oz (81.8 kg)    GEN: No acute distress. NECK: JVP at mid neck; No carotid bruits. CARDIAC: RRR, no murmurs, rubs, gallops. RESPIRATORY:  Clear to auscultation. EXTREMITIES:  Warm and well-perfused. No edema.  ASSESSMENT AND PLAN Chronic hypoxemic respiratory failure secondary to ILD Possible HFpEF Patient presents for further evaluation of possible HFpEF.  She was recently hospitalized for acute hypoxemic respiratory failure and treated with antibiotics and diuretics.  Her BNP was normal.  Her echo shows preserved ejection fraction with a mildly dilated RV and abnormal septal motion.  Suspect most of her symptoms are due to chronic lung disease with or without resultant pulmonary hypertension.  She does have risk factors for HFpEF given obesity and DM2.  Her JVP is mildly elevated to the mid neck today.  There seems to be at least some cardiac component to her presentation, so I think a right heart cath would be very valuable.  Plan: - Increase Lasix  to 60 mg daily - Start dapagliflozin 10 mg daily for presumptive diagnosis of HFpEF - Right heart cath to further stratify and assess for pre/post/mixed pulmonary hypertension since it is unclear to me if this is HFpEF, ILD with pulmonary hypertension, or some combination thereof - If she has significant pulmonary hypertension, we will plan to refer her to advanced heart failure  CAC Aortic atherosclerosis HLD CAC and aortic atherosclerosis noted on  recent CT scan.  Last LDL 89 09/2022.  No anginal symptoms.  Plan: - Continue ASA 81 mg daily - Recheck lipids, LDL goal less than 70 - Increase Lipitor to 80 mg daily     Informed Consent   The risks, including but not limited to, [bleeding or vascular complications (1 in 500), pneumothorax (1 in 1600), arrhythmia (1 in 1000) and death (1 in  5000)], benefits (diagnostic support and/or management of heart failure, pulmonary hypertension) and alternatives of a right heart catheterization were discussed in detail with Erika Schmidt and she is willing to proceed.     Dispo: RTC 3 months or sooner as needed  Signed, Caron Poser, MD

## 2024-06-22 NOTE — Patient Instructions (Signed)
 Medication Instructions:  Your physician recommends the following medication changes.   START TAKING: Farxiga 10 mg by mouth once daily  INCREASE: Atorvastatin  (Lipitor) to  80 mg by mouth once daily (recommend in the evening)  Furosemide  (LASIX ) to 60 mg by mouth once daily  Continue all other medication as prescribed  *If you need a refill on your cardiac medications before your next appointment, please call your pharmacy*  Lab Work: Your provider would like for you to have following labs drawn today CBC, BMP.   If you have labs (blood work) drawn today and your tests are completely normal, you will receive your results only by: MyChart Message (if you have MyChart) OR A paper copy in the mail If you have any lab test that is abnormal or we need to change your treatment, we will call you to review the results.  Testing/Procedures:  [place] [facility] [place] [facility] [place] [place] [address] [place] [address] Dept: [phone] Loc: [phone]  Erika Schmidt  06/22/2024  You are scheduled for a Cardiac Catheterization on Thursday, November 20 with Dr. [name].  1. Please arrive at the [facility] Entrance of [facility], [address], [place] [address] at 7:30 AM (This is 1 hour(s) prior to your procedure time).  Proceed to the Check-In Desk directly inside the entrance.  Procedure Parking: Use the entrance off of the [address] side of the hospital. Turn right upon entering and follow the driveway to parking that is directly in front of the Heart [facility] Vascular Center. There is no valet parking available at this entrance, however there is an awning directly in front of the Heart & Vascular Center for drop off/ pick up for patients.  Special note: Every effort is made to have your procedure done on time. Please understand that emergencies sometimes delay scheduled  procedures.  2. Diet: Nothing to eat after midnight.   3. Hydration: Nothing to drink after midnight.   4. Labs: CBC and BMET drawn on 06/22/2024 at [facility]  5. Medication instructions in preparation for your procedure:   Contrast Allergy: No  Stop taking, Cozaar (Losartan) Wednesday, November 19,   Do not take Diabetes Med Glucophage (Metformin) on the day of the procedure and HOLD 48 HOURS AFTER THE PROCEDURE.  On the morning of your procedure, take your Aspirin  81 mg and any morning medicines NOT listed above.  You may use sips of water .  6. Plan to go home the same day, you will only stay overnight if medically necessary. 7. Bring a current list of your medications and current insurance cards. 8. You MUST have a responsible person to drive you home. 9. Someone MUST be with you the first 24 hours after you arrive home or your discharge will be delayed. 10. Please wear clothes that are easy to get on and off and wear slip-on shoes.  Thank you for allowing us  to care for you!   -- [facility]   Follow-Up: At [facility], you and your health needs are our priority.  As part of our continuing mission to provide you with exceptional heart care, our providers are all part of one team.  This team includes your primary Cardiologist (physician) and Advanced Practice Providers or APPs (Physician Assistants and Nurse Practitioners) who all work together to provide you with the care you need, when you need it.  Your next appointment:  3 month(s)  Provider:  Caron Poser, MD  We recommend signing up for the patient portal called MyChart.  Sign up information is provided on this After Visit Summary.  MyChart is used to connect with patients for Virtual Visits (Telemedicine).  Patients are able to view lab/test results, encounter notes, upcoming appointments, etc.  Non-urgent messages can be sent to your provider as well.   To learn  more about what you can do with MyChart, go to [email or web address].

## 2024-06-22 NOTE — Progress Notes (Signed)
 Cardiology Office Note   Date:  06/22/2024  ID:  Erika Schmidt, DOB 06/15/1956, MRN [id number] PCP: Sherial Bail, MD  [facility] Providers Cardiologist:  Caron Poser, MD     History of Present Illness Erika Schmidt is a 68 y.o. female PMH DM2, HTN, HLD, reported HFpEF, breast cancer, chronic hypoxemic respiratory failure secondary to ILD on home oxygen who presents for further evaluation management of HFpEF.  Patient was recently hospitalized and discharged 06/11/2024 for dyspnea.  Patient was treated with diuretics and antibiotics which seem to get her near her known baseline.  BNP was negative.  Echo showed preserved ejection fraction with grade 1 diastolic dysfunction.  Last LDL 89 09/2022.  She notes she is overall still at her baseline.  She has been taking an increased dose of Lasix  since discharge.  I discussed obtaining a right heart cath with her today since it is somewhat unclear to me what is the primary driving force behind her symptoms and recent hospitalization.  She was initially hesitant but then was willing to give it a try.  Relevant CVD History -TTE 06/09/2024 LVEF 50 to 55%, grade 1 diastolic dysfunction, no significant valve disease -CTPA 06/07/2024 with aortic atherosclerosis and CAC -TEE 03/2023 normal biventricular function without valvular disease.  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 - Exercise stress SPECT 12/2019 normal perfusion, normal ECG, normal hemodynamics   ROS: Pt denies any chest discomfort, jaw pain, arm pain, palpitations, syncope, presyncope, orthopnea, PND, or LE edema.  Studies Reviewed I have independently reviewed the patient's ECG, CT scan, recent medical records, recent blood work.  Physical Exam VS:  BP 110/60   Pulse 75   Ht 5' 3 (1.6 m)   Wt 175 lb 6.4 oz (79.6 kg)   BMI  31.07 kg/m        Wt Readings from Last 3 Encounters:  06/22/24 175 lb 6.4 oz (79.6 kg)  06/08/24 177 lb 14.6 oz (80.7 kg)  04/25/24 180 lb 4.8 oz (81.8 kg)    GEN: No acute distress. NECK: JVP at mid neck; No carotid bruits. CARDIAC: RRR, no murmurs, rubs, gallops. RESPIRATORY:  Clear to auscultation. EXTREMITIES:  Warm and well-perfused. No edema.  ASSESSMENT AND PLAN Chronic hypoxemic respiratory failure secondary to ILD Possible HFpEF Patient presents for further evaluation of possible HFpEF.  She was recently hospitalized for acute hypoxemic respiratory failure and treated with antibiotics and diuretics.  Her BNP was normal.  Her echo shows preserved ejection fraction with a mildly dilated RV and abnormal septal motion.  Suspect most of her symptoms are due to chronic lung disease with or without resultant pulmonary hypertension.  She does have risk factors for HFpEF given obesity and DM2.  Her JVP is mildly elevated to the mid neck today.  There seems to be at least some cardiac component to her presentation, so I think a right heart cath would be very valuable.  Plan: - Increase Lasix  to 60 mg daily - Start dapagliflozin 10 mg daily for presumptive diagnosis of HFpEF - Right heart cath to further stratify and assess for pre/post/mixed pulmonary hypertension since it is unclear to me if this is HFpEF, ILD with pulmonary hypertension, or some combination thereof - If she has significant pulmonary hypertension, we will plan to refer her to advanced heart failure  CAC Aortic atherosclerosis HLD CAC and aortic atherosclerosis noted on  recent CT scan.  Last LDL 89 09/2022.  No anginal symptoms.  Plan: - Continue ASA 81 mg daily - Recheck lipids, LDL goal less than 70 - Increase Lipitor to 80 mg daily     Informed Consent   The risks, including but not limited to, [bleeding or vascular complications (1 in 500), pneumothorax (1 in 1600), arrhythmia (1 in 1000) and death (1 in  5000)], benefits (diagnostic support and/or management of heart failure, pulmonary hypertension) and alternatives of a right heart catheterization were discussed in detail with Erika Schmidt and she is willing to proceed.     Dispo: RTC 3 months or sooner as needed  Signed, Caron Poser, MD

## 2024-06-23 ENCOUNTER — Ambulatory Visit: Payer: Self-pay

## 2024-06-23 LAB — BASIC METABOLIC PANEL WITH GFR
BUN/Creatinine Ratio: 16 (ref 12–28)
BUN: 14 mg/dL (ref 8–27)
CO2: 26 mmol/L (ref 20–29)
Calcium: 10 mg/dL (ref 8.7–10.3)
Chloride: 98 mmol/L (ref 96–106)
Creatinine, Ser: 0.86 mg/dL (ref 0.57–1.00)
Glucose: 106 mg/dL — ABNORMAL HIGH (ref 70–99)
Potassium: 4.1 mmol/L (ref 3.5–5.2)
Sodium: 140 mmol/L (ref 134–144)
eGFR: 74 mL/min/1.73 (ref >59)

## 2024-06-23 LAB — CBC
Hematocrit: 46 % (ref 34.0–46.6)
Hemoglobin: 14.4 g/dL (ref 11.1–15.9)
MCH: 26.9 pg (ref 26.6–33.0)
MCHC: 31.3 g/dL — ABNORMAL LOW (ref 31.5–35.7)
MCV: 86 fL (ref 79–97)
Platelets: 331 x10E3/uL (ref 150–450)
RBC: 5.35 x10E6/uL — ABNORMAL HIGH (ref 3.77–5.28)
RDW: 14.7 % (ref 11.7–15.4)
WBC: 11.9 x10E3/uL — ABNORMAL HIGH (ref 3.4–10.8)

## 2024-06-28 ENCOUNTER — Telehealth: Payer: Self-pay

## 2024-06-28 NOTE — Telephone Encounter (Signed)
 So sad to hear that !!  DH

## 2024-06-28 NOTE — Telephone Encounter (Signed)
 Pt's niece called and requests (d/t pt's mother dying) that we cancel tomorrow's procedure and call back next Wednesday to reschedule  Erika Schmidt in scheduling called to remove pt from tomorrow cath lab   Msg will be forwarded to TS, and [facility]

## 2024-06-28 NOTE — Telephone Encounter (Signed)
 Caller (Tia - patient's niece) stated patient wants to reschedule her procedure as her mother passed away today 07-23-24).

## 2024-06-29 ENCOUNTER — Encounter: Admission: RE | Payer: Self-pay | Source: Home / Self Care

## 2024-06-29 ENCOUNTER — Ambulatory Visit: Admission: RE | Admit: 2024-06-29 | Source: Home / Self Care | Admitting: Cardiology

## 2024-06-29 DIAGNOSIS — I5030 Unspecified diastolic (congestive) heart failure: Secondary | ICD-10-CM

## 2024-06-29 SURGERY — RIGHT HEART CATH
Anesthesia: Moderate Sedation | Laterality: Right

## 2024-07-05 NOTE — Telephone Encounter (Signed)
 Pt returning call. Please advise.

## 2024-07-05 NOTE — Telephone Encounter (Signed)
 Attempted to contact pt to reschedule cath. Left message to call back.

## 2024-07-10 NOTE — Telephone Encounter (Signed)
 Called patient - Cath has been rescheduled for 12/4 at 12:30, reviewed medications, and diet/hydration with patient - patient verbalized understanding and agreement with plan

## 2024-07-13 ENCOUNTER — Encounter: Admission: RE | Disposition: A | Payer: Self-pay | Source: Home / Self Care | Attending: Cardiology

## 2024-07-13 ENCOUNTER — Encounter: Payer: Self-pay | Admitting: Cardiology

## 2024-07-13 ENCOUNTER — Ambulatory Visit
Admission: RE | Admit: 2024-07-13 | Discharge: 2024-07-13 | Disposition: A | Attending: Cardiology | Admitting: Cardiology

## 2024-07-13 ENCOUNTER — Other Ambulatory Visit: Payer: Self-pay

## 2024-07-13 DIAGNOSIS — I5030 Unspecified diastolic (congestive) heart failure: Secondary | ICD-10-CM

## 2024-07-13 DIAGNOSIS — E785 Hyperlipidemia, unspecified: Secondary | ICD-10-CM | POA: Diagnosis not present

## 2024-07-13 DIAGNOSIS — J849 Interstitial pulmonary disease, unspecified: Secondary | ICD-10-CM | POA: Diagnosis not present

## 2024-07-13 DIAGNOSIS — I2723 Pulmonary hypertension due to lung diseases and hypoxia: Secondary | ICD-10-CM | POA: Diagnosis not present

## 2024-07-13 DIAGNOSIS — I5032 Chronic diastolic (congestive) heart failure: Secondary | ICD-10-CM

## 2024-07-13 DIAGNOSIS — J9611 Chronic respiratory failure with hypoxia: Secondary | ICD-10-CM

## 2024-07-13 DIAGNOSIS — I700 Atherosclerosis of aorta: Secondary | ICD-10-CM | POA: Diagnosis not present

## 2024-07-13 DIAGNOSIS — Z853 Personal history of malignant neoplasm of breast: Secondary | ICD-10-CM | POA: Diagnosis not present

## 2024-07-13 DIAGNOSIS — I110 Hypertensive heart disease with heart failure: Secondary | ICD-10-CM | POA: Diagnosis not present

## 2024-07-13 DIAGNOSIS — Z9981 Dependence on supplemental oxygen: Secondary | ICD-10-CM | POA: Diagnosis not present

## 2024-07-13 DIAGNOSIS — Z7982 Long term (current) use of aspirin: Secondary | ICD-10-CM | POA: Diagnosis not present

## 2024-07-13 DIAGNOSIS — E119 Type 2 diabetes mellitus without complications: Secondary | ICD-10-CM | POA: Diagnosis not present

## 2024-07-13 DIAGNOSIS — Z79899 Other long term (current) drug therapy: Secondary | ICD-10-CM | POA: Diagnosis not present

## 2024-07-13 HISTORY — PX: RIGHT HEART CATH: CATH118263

## 2024-07-13 LAB — POCT I-STAT EG7
Acid-Base Excess: 4 mmol/L — ABNORMAL HIGH (ref 0.0–2.0)
Acid-Base Excess: 5 mmol/L — ABNORMAL HIGH (ref 0.0–2.0)
Bicarbonate: 29.9 mmol/L — ABNORMAL HIGH (ref 20.0–28.0)
Bicarbonate: 30.3 mmol/L — ABNORMAL HIGH (ref 20.0–28.0)
Calcium, Ion: 1.23 mmol/L (ref 1.15–1.40)
Calcium, Ion: 1.23 mmol/L (ref 1.15–1.40)
HCT: 42 % (ref 36.0–46.0)
HCT: 42 % (ref 36.0–46.0)
Hemoglobin: 14.3 g/dL (ref 12.0–15.0)
Hemoglobin: 14.3 g/dL (ref 12.0–15.0)
O2 Saturation: 64 %
O2 Saturation: 65 %
Potassium: 3.5 mmol/L (ref 3.5–5.1)
Potassium: 3.5 mmol/L (ref 3.5–5.1)
Sodium: 141 mmol/L (ref 135–145)
Sodium: 142 mmol/L (ref 135–145)
TCO2: 31 mmol/L (ref 22–32)
TCO2: 32 mmol/L (ref 22–32)
pCO2, Ven: 46.7 mmHg (ref 44–60)
pCO2, Ven: 46.9 mmHg (ref 44–60)
pH, Ven: 7.414 (ref 7.25–7.43)
pH, Ven: 7.418 (ref 7.25–7.43)
pO2, Ven: 33 mmHg (ref 32–45)
pO2, Ven: 34 mmHg (ref 32–45)

## 2024-07-13 LAB — GLUCOSE, CAPILLARY
Glucose-Capillary: 57 mg/dL — ABNORMAL LOW (ref 70–99)
Glucose-Capillary: 53 mg/dL — ABNORMAL LOW (ref 70–99)
Glucose-Capillary: 79 mg/dL (ref 70–99)

## 2024-07-13 SURGERY — RIGHT HEART CATH
Anesthesia: Moderate Sedation | Laterality: Right

## 2024-07-13 MED ORDER — ACETAMINOPHEN 325 MG PO TABS: 650.0000 mg | ORAL_TABLET | ORAL | Status: DC | PRN

## 2024-07-13 MED ORDER — LIDOCAINE HCL (PF) 1 % IJ SOLN
INTRAMUSCULAR | Status: DC | PRN
  Administered 2024-07-13: 5 mL

## 2024-07-13 MED ORDER — FREE WATER: 500.0000 mL | Freq: Once | Status: DC

## 2024-07-13 MED ORDER — SODIUM CHLORIDE 0.9% FLUSH: 3.0000 mL | Freq: Two times a day (BID) | INTRAVENOUS | Status: DC

## 2024-07-13 MED ORDER — SODIUM CHLORIDE 0.9% FLUSH: 3.0000 mL | INTRAVENOUS | Status: DC | PRN

## 2024-07-13 MED ORDER — SODIUM CHLORIDE 0.9 % IV SOLN: 250.0000 mL | INTRAVENOUS | Status: DC | PRN

## 2024-07-13 MED ORDER — FREE WATER: 250.0000 mL | Freq: Once | Status: DC

## 2024-07-13 MED ORDER — LABETALOL HCL 5 MG/ML IV SOLN: 10.0000 mg | INTRAVENOUS | Status: DC | PRN

## 2024-07-13 MED ORDER — SODIUM CHLORIDE 0.9 % IV SOLN
250.0000 mL | INTRAVENOUS | Status: DC | PRN
  Administered 2024-07-13: 250 mL via INTRAVENOUS

## 2024-07-13 MED ORDER — LIDOCAINE HCL 1 % IJ SOLN
INTRAMUSCULAR | Status: AC
  Filled 2024-07-13: qty 20

## 2024-07-13 MED ORDER — HYDRALAZINE HCL 20 MG/ML IJ SOLN: 10.0000 mg | INTRAMUSCULAR | Status: DC | PRN

## 2024-07-13 MED ORDER — HEPARIN (PORCINE) IN NACL 1000-0.9 UT/500ML-% IV SOLN
INTRAVENOUS | Status: DC | PRN
  Administered 2024-07-13 (×2): 500 mL

## 2024-07-13 MED ORDER — ONDANSETRON HCL 4 MG/2ML IJ SOLN: 4.0000 mg | Freq: Four times a day (QID) | INTRAMUSCULAR | Status: DC | PRN

## 2024-07-13 SURGICAL SUPPLY — 6 items
CATH BALLN WEDGE 5F 110CM (CATHETERS) IMPLANT
DRAPE BRACHIAL (DRAPES) IMPLANT
PACK CARDIAC CATH (CUSTOM PROCEDURE TRAY) ×1 IMPLANT
SET ATX-X65L (MISCELLANEOUS) IMPLANT
SHEATH GLIDE SLENDER 4/5FR (SHEATH) IMPLANT
STATION PROTECTION PRESSURIZED (MISCELLANEOUS) IMPLANT

## 2024-07-13 NOTE — Interval H&P Note (Signed)
 History and Physical Interval Note:  07/13/2024 12:38 PM  Erika Schmidt  has presented today for surgery, with the diagnosis of R Cath   HFpEF.  The various methods of treatment have been discussed with the patient and family. After consideration of risks, benefits and other options for treatment, the patient has consented to  Procedure(s): RIGHT HEART CATH (Right) as a surgical intervention.  The patient's history has been reviewed, patient examined, no change in status, stable for surgery.  I have reviewed the patient's chart and labs.  Questions were answered to the patient's satisfaction.     [name]

## 2024-07-20 ENCOUNTER — Telehealth: Payer: Self-pay

## 2024-07-20 DIAGNOSIS — I272 Pulmonary hypertension, unspecified: Secondary | ICD-10-CM

## 2024-07-20 NOTE — Telephone Encounter (Signed)
 Called patient to discuss results of right heart cath.  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  She already follows with pulmonology for this issue.  I discussed that we will plan to have her see our advanced heart failure group regarding their opinion on whether or not she would benefit from any pulmonary vasodilator therapies.  She is in agreement with this plan.  Caron Poser, MD

## 2024-08-03 ENCOUNTER — Other Ambulatory Visit: Payer: Self-pay | Admitting: Oncology

## 2024-08-25 ENCOUNTER — Inpatient Hospital Stay: Attending: Oncology | Admitting: Oncology

## 2024-08-25 ENCOUNTER — Encounter: Payer: Self-pay | Admitting: Oncology

## 2024-08-28 ENCOUNTER — Encounter: Payer: Self-pay | Admitting: *Deleted

## 2024-11-07 ENCOUNTER — Inpatient Hospital Stay: Admitting: Oncology
